# Patient Record
Sex: Male | Born: 1950 | Race: White | Hispanic: No | Marital: Married | Smoking: Former smoker
Health system: Southern US, Community
[De-identification: ages and names within clinical notes are randomized; demographics above are authoritative.]

## PROBLEM LIST (undated history)

## (undated) DIAGNOSIS — F32A Depression, unspecified: Secondary | ICD-10-CM

## (undated) DIAGNOSIS — J301 Allergic rhinitis due to pollen: Secondary | ICD-10-CM

## (undated) DIAGNOSIS — T7840XA Allergy, unspecified, initial encounter: Secondary | ICD-10-CM

## (undated) DIAGNOSIS — J40 Bronchitis, not specified as acute or chronic: Secondary | ICD-10-CM

## (undated) DIAGNOSIS — Z807 Family history of other malignant neoplasms of lymphoid, hematopoietic and related tissues: Secondary | ICD-10-CM

## (undated) DIAGNOSIS — F329 Major depressive disorder, single episode, unspecified: Secondary | ICD-10-CM

## (undated) DIAGNOSIS — J439 Emphysema, unspecified: Secondary | ICD-10-CM

## (undated) DIAGNOSIS — J449 Chronic obstructive pulmonary disease, unspecified: Secondary | ICD-10-CM

## (undated) DIAGNOSIS — D509 Iron deficiency anemia, unspecified: Secondary | ICD-10-CM

## (undated) DIAGNOSIS — Z8371 Family history of colonic polyps: Secondary | ICD-10-CM

## (undated) DIAGNOSIS — Z860101 Personal history of adenomatous and serrated colon polyps: Secondary | ICD-10-CM

## (undated) DIAGNOSIS — E785 Hyperlipidemia, unspecified: Secondary | ICD-10-CM

## (undated) DIAGNOSIS — J189 Pneumonia, unspecified organism: Secondary | ICD-10-CM

## (undated) DIAGNOSIS — J45909 Unspecified asthma, uncomplicated: Secondary | ICD-10-CM

## (undated) DIAGNOSIS — Z86010 Personal history of colonic polyps: Secondary | ICD-10-CM

## (undated) DIAGNOSIS — K219 Gastro-esophageal reflux disease without esophagitis: Secondary | ICD-10-CM

## (undated) DIAGNOSIS — Z808 Family history of malignant neoplasm of other organs or systems: Secondary | ICD-10-CM

## (undated) DIAGNOSIS — Z801 Family history of malignant neoplasm of trachea, bronchus and lung: Secondary | ICD-10-CM

## (undated) HISTORY — DX: Family history of malignant neoplasm of other organs or systems: Z80.8

## (undated) HISTORY — DX: Personal history of adenomatous and serrated colon polyps: Z86.0101

## (undated) HISTORY — PX: NASAL POLYP SURGERY: SHX186

## (undated) HISTORY — DX: Family history of colonic polyps: Z83.71

## (undated) HISTORY — DX: Allergy, unspecified, initial encounter: T78.40XA

## (undated) HISTORY — DX: Hyperlipidemia, unspecified: E78.5

## (undated) HISTORY — DX: Gastro-esophageal reflux disease without esophagitis: K21.9

## (undated) HISTORY — PX: COLONOSCOPY: SHX174

## (undated) HISTORY — DX: Major depressive disorder, single episode, unspecified: F32.9

## (undated) HISTORY — DX: Emphysema, unspecified: J43.9

## (undated) HISTORY — DX: Family history of other malignant neoplasms of lymphoid, hematopoietic and related tissues: Z80.7

## (undated) HISTORY — DX: Iron deficiency anemia, unspecified: D50.9

## (undated) HISTORY — DX: Chronic obstructive pulmonary disease, unspecified: J44.9

## (undated) HISTORY — DX: Allergic rhinitis due to pollen: J30.1

## (undated) HISTORY — PX: POLYPECTOMY: SHX149

## (undated) HISTORY — DX: Personal history of colonic polyps: Z86.010

## (undated) HISTORY — DX: Family history of malignant neoplasm of trachea, bronchus and lung: Z80.1

## (undated) HISTORY — PX: UPPER GASTROINTESTINAL ENDOSCOPY: SHX188

## (undated) HISTORY — DX: Depression, unspecified: F32.A

---

## 1998-07-02 HISTORY — PX: CARDIAC CATHETERIZATION: SHX172

## 1998-08-31 ENCOUNTER — Ambulatory Visit (HOSPITAL_COMMUNITY): Admission: RE | Admit: 1998-08-31 | Discharge: 1998-08-31 | Payer: Self-pay | Admitting: *Deleted

## 1999-05-11 ENCOUNTER — Ambulatory Visit (HOSPITAL_COMMUNITY): Admission: RE | Admit: 1999-05-11 | Discharge: 1999-05-11 | Payer: Self-pay | Admitting: Internal Medicine

## 1999-05-11 ENCOUNTER — Encounter: Payer: Self-pay | Admitting: Internal Medicine

## 1999-10-18 ENCOUNTER — Ambulatory Visit (HOSPITAL_BASED_OUTPATIENT_CLINIC_OR_DEPARTMENT_OTHER): Admission: RE | Admit: 1999-10-18 | Discharge: 1999-10-18 | Payer: Self-pay | Admitting: *Deleted

## 2004-01-14 ENCOUNTER — Other Ambulatory Visit: Admission: RE | Admit: 2004-01-14 | Discharge: 2004-01-14 | Payer: Self-pay | Admitting: Cardiology

## 2005-02-18 ENCOUNTER — Emergency Department: Payer: Self-pay | Admitting: Emergency Medicine

## 2005-02-24 ENCOUNTER — Emergency Department: Payer: Self-pay | Admitting: Emergency Medicine

## 2009-05-05 ENCOUNTER — Emergency Department: Payer: Self-pay | Admitting: Emergency Medicine

## 2009-05-24 ENCOUNTER — Ambulatory Visit: Payer: Self-pay | Admitting: Family Medicine

## 2009-05-24 DIAGNOSIS — J45909 Unspecified asthma, uncomplicated: Secondary | ICD-10-CM | POA: Insufficient documentation

## 2009-05-24 DIAGNOSIS — J439 Emphysema, unspecified: Secondary | ICD-10-CM | POA: Insufficient documentation

## 2009-05-24 DIAGNOSIS — F329 Major depressive disorder, single episode, unspecified: Secondary | ICD-10-CM | POA: Insufficient documentation

## 2009-05-24 DIAGNOSIS — K219 Gastro-esophageal reflux disease without esophagitis: Secondary | ICD-10-CM | POA: Insufficient documentation

## 2009-05-24 DIAGNOSIS — F3289 Other specified depressive episodes: Secondary | ICD-10-CM | POA: Insufficient documentation

## 2009-06-09 ENCOUNTER — Ambulatory Visit: Payer: Self-pay | Admitting: Internal Medicine

## 2009-06-14 ENCOUNTER — Encounter: Payer: Self-pay | Admitting: Family Medicine

## 2010-05-26 ENCOUNTER — Encounter: Payer: Self-pay | Admitting: Family Medicine

## 2010-06-01 ENCOUNTER — Ambulatory Visit: Payer: Self-pay | Admitting: Family Medicine

## 2010-07-05 ENCOUNTER — Ambulatory Visit
Admission: RE | Admit: 2010-07-05 | Discharge: 2010-07-05 | Payer: Self-pay | Source: Home / Self Care | Attending: Family Medicine | Admitting: Family Medicine

## 2010-08-03 NOTE — Assessment & Plan Note (Signed)
Summary: 6 WK F/U DLO   Vital Signs:  Patient profile:   60 year old male Height:      67.25 inches Weight:      191.50 pounds BMI:     29.88 Temp:     97.8 degrees F oral Pulse rate:   80 / minute Pulse rhythm:   regular BP sitting:   130 / 80  (left arm) Cuff size:   large  Vitals Entered By: Benny Lennert CMA Duncan Dull) (July 05, 2010 9:43 AM)  History of Present Illness: Chief complaint 3 week follow up   60 year old male:  Got Dulera and Spiriva -   Never having to use the rescue at all  Feels much better, tremendous difference, felt so within the first week Now able to work outside, do farm work, in the cold, no SOB, no dyspnea on exertion.  Allergies: 1)  ! * Pinicillin  Past History:  Past medical, surgical, family and social histories (including risk factors) reviewed, and no changes noted (except as noted below).  Past Medical History: Reviewed history from 06/14/2009 and no changes required. GERD COPD, Severe, dx on PFT, 06/2009 DEPRESSION ASTHMA, CHILDHOOD   Past Surgical History: Reviewed history from 06/14/2009 and no changes required. 2000 Heart catherazation, normal 1990-2000 ployps removed from nasal sinusus  PFT: 06/2009, severe COPD with % < 50  Family History: Reviewed history from 05/24/2009 and no changes required. Family History of Alcoholism/Addiction Family History of Arthritis Family History Breast cancer 1st degree relative <50 Family History High cholesterol Family History Hypertension  Social History: Reviewed history from 05/24/2009 and no changes required. Occupation: fumigation Spouse of [place] Alcohol use-no  Review of Systems Resp:  Denies cough, pleuritic, shortness of breath, sputum productive, and wheezing.  Physical Exam  General:  Well-developed,well-nourished,in no acute distress; alert,appropriate and cooperative throughout examination Head:  Normocephalic and atraumatic without obvious  abnormalities. No apparent alopecia or balding. Ears:  no external deformities.   Nose:  no external deformity.   Lungs:  normal respiratory effort, no intercostal retractions, no accessory muscle use, no crackles, and no wheezes.  decreased bs diffusely Heart:  Normal rate and regular rhythm. S1 and S2 normal without gallop, murmur, click, rub or other extra sounds. Psych:  Cognition and judgment appear intact. Alert and cooperative with normal attention span and concentration. No apparent delusions, illusions, hallucinations   Impression & Recommendations:  Problem # 1:  COPD (ICD-496) Assessment Improved dramatically better, f/u 6 mo for cpx  His updated medication list for this problem includes:    Combivent 18-103 Mcg/act Aero (Ipratropium-albuterol) .Marland Kitchen... 2 puffs every 4 hours    Spiriva Handihaler 18 Mcg Caps (Tiotropium bromide monohydrate) .Marland Kitchen... Contents of one capsule inhaled daily    Dulera 100-5 Mcg/act Aero (Mometasone furo-formoterol fum) .Marland Kitchen... 2 puffs b.i.d.  Complete Medication List: 1)  Prilosec 20 Mg Cpdr (Omeprazole) .... Daily 2)  Mucus Relief 400 Mg Tabs (Guaifenesin) .... One tablet 2 times daily 3)  Combivent 18-103 Mcg/act Aero (Ipratropium-albuterol) .... 2 puffs every 4 hours 4)  Spiriva Handihaler 18 Mcg Caps (Tiotropium bromide monohydrate) .... Contents of one capsule inhaled daily 5)  Dulera 100-5 Mcg/act Aero (Mometasone furo-formoterol fum) .... 2 puffs b.i.d.  Patient Instructions: 1)  6 months 2)  IDEALLY FOR A COMPLETE PHYSICAL    Orders Added: 1)  Est. Patient Level III [16109]     Current Allergies (reviewed today): ! * PINICILLIN

## 2010-08-03 NOTE — Assessment & Plan Note (Signed)
Summary: F/U [facility]   Vital Signs:  Patient profile:   60 year old male Height:      67.25 inches Weight:      190.75 pounds BMI:     29.76 Temp:     97.9 degrees F oral Pulse rate:   80 / minute Pulse rhythm:   regular BP sitting:   130 / 80  (left arm) Cuff size:   regular  Vitals Entered By: Benny Lennert CMA Duncan Dull) (June 01, 2010 8:45 AM)    History of Present Illness: Chief complaint Follow up [facility] (copd)  60 year old gentleman with significant chronic obstructive pulmonary disease with severe chronic obstructive pulmonary disease based on gold criteria prior PFTs. He has been lost to followup, never followed up, and has not been taking any inhalers. He did go to the urgent care a week or 2 ago, and was given a course of azithromycin, and has been using Combivent intermittently.  He received some Combivent from a friend, has been using up to about 7 times a day.  He does have significant shortness of breath and tightness particularly goes out in the cold air. 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  He also smoked for greater than 40 years, but quit about 5 years ago.  Review of systems: The fever or chills currently. He continued to have some occasional cough significant shortness of breath is reported as interfering with his ability to work and function, particularly in the cold air.  GEN: WDWN, NAD, Non-toxic, A & O x 3 HEENT: Atraumatic, Normocephalic. Neck supple. No masses, No LAD. Ears and Nose: No external deformity. CV: RRR, No M/G/R. No JVD. No thrill. No extra heart sounds. PULM: ddecreased breath sounds throughout without any significant wheezing, but poor air movement. No focal crackles. EXTR: No c/c/e NEURO: Normal gait.  PSYCH: Normally interactive. Conversant. Not depressed or anxious appearing.  Calm demeanor.    Allergies: 1)  ! *  Pinicillin   Impression & Recommendations:  Problem # 1:  COPD (ICD-496) Assessment Deteriorated followup in one month for complete physical examination.  He is poorly controlled. Limited financially somewhat. I gave him 6 months worth of samples. He is also going to pay for Spiriva out of pocket.  His updated medication list for this problem includes:    Combivent 18-103 Mcg/act Aero (Ipratropium-albuterol) .Marland Kitchen... 2 puffs every 4 hours    Spiriva Handihaler 18 Mcg Caps (Tiotropium bromide monohydrate) .Marland Kitchen... Contents of one capsule inhaled daily    Dulera 100-5 Mcg/act Aero (Mometasone furo-formoterol fum) .Marland Kitchen... 2 puffs b.i.d.  Complete Medication List: 1)  Prilosec 20 Mg Cpdr (Omeprazole) .... Daily 2)  Mucus Relief 400 Mg Tabs (Guaifenesin) .... One tablet 2 times daily 3)  Combivent 18-103 Mcg/act Aero (Ipratropium-albuterol) .... 2 puffs every 4 hours 4)  Spiriva Handihaler 18 Mcg Caps (Tiotropium bromide monohydrate) .... Contents of one capsule inhaled daily 5)  Prednisone 20 Mg Tabs (Prednisone) .... 2 by mouth daily 6)  Dulera 100-5 Mcg/act Aero (Mometasone furo-formoterol fum) .... 2 puffs b.i.d.  Patient Instructions: 1)  f/u 4-6 weeks Prescriptions: PREDNISONE 20 MG TABS (PREDNISONE) 2 by mouth daily  #14 x 0   Entered and Authorized by:   Hannah Beat MD   Signed by:   Hannah Beat MD on 06/01/2010   Method used:   Print then Give to Patient   RxID:  [id number] SPIRIVA HANDIHALER 18 MCG  CAPS (TIOTROPIUM BROMIDE MONOHYDRATE) contents of one capsule inhaled daily  #1 x 11   Entered and Authorized by:   Hannah Beat MD   Signed by:   Hannah Beat MD on 06/01/2010   Method used:   Print then Give to Patient   RxID:   [id number]    Orders Added: 1)  Est. Patient Level III [32355]    Current Allergies (reviewed today): ! * PINICILLIN

## 2010-08-03 NOTE — Letter (Signed)
Summary: [facility]   Imported By: Sherian Rein 06/06/2010 12:08:48  _____________________________________________________________________  External Attachment:    Type:   Image     Comment:   External Document

## 2010-11-17 NOTE — Op Note (Signed)
[place]. [facility]  Patient:    Richard Grimes, Richard Grimes                        MRN: [id number] Proc. Date: 10/18/99 Adm. Date:  [id number] Attending:  Aundria Mems                           Operative Report  PREOPERATIVE DIAGNOSIS:  Recurrent obstructing nasal sinus polyposis bilaterally.  POSTOPERATIVE DIAGNOSIS:  Recurrent obstructing nasal sinus polyposis bilaterally.  OPERATION PERFORMED:  Bilateral endoscopic removal of nasal sinus polyps by suction debridement.  SURGEON:  Kathy Breach, M.D.  ANESTHESIA:  General orotracheal.  DESCRIPTION OF PROCEDURE:  With the patient under general orotracheal anesthesia, the face was prepped and draped in sterile fashion. Vasoconstriction by nasal block with 4% Xylocaine ____________ solution on olive tip probes applied to the sphenopalatine anterior ethmoid nerve areas bilaterally.  Cotton pledgets soaked in similar solution were inserted packing the nose as much as possible with the presence of almost completely occluding polyps right greater than left.  Infiltration 1% Xylocaine 1:100,000 epinephrine into the anterior aspect of the middle meatal mucosa completed for further vasoconstriction.  On examination following decongesting patients nose, he had large mucoid polyps multiple almost completely occluding the right nasal chamber, middle meatal mainly and completely filling the posterir choanae.  On the left side, similar findings except not completely obstructive.  Under visualization with the nasal endoscope and using the flexion debrider, beginning on the right side, all the soft tissue polypoid mass readily encountered under direct visualization with the endoscope was removed until the previously formed wide sphenoid opening window was visualized and polypoid tissue removed as readily obtainable with the suction debrider from the sphenoid sinus on the right side.  There was no identifiable remnant of the  middle meatus on the right side from previous surgery. Polypoid tissue going medially and superiorly was removed but particular care was taken to not go high medially as there appeared to be a low riding lamina papyracea and possible very thin bony defect in the posterior lamina papyracea area.  The opening into the maxillary sinus was present and polypoid degenerative mucosa visualized but an aerated central portion to visualization through the ____________ window pre-existing from his previous surgery and polyp removal.  Essentially similar procedure done on the left side.  There was substantial remaining anterior aspect of the middle turbinate as landmarked for protection of approaching the lamina or the cribriform plate area which was avoided.  Again, there was a large opening into the sphenoid sinus that polypoid debris from the sphenoid sinus could be evacuated with suction debrider.  A large opening into the left antrum again visibly aerated in the central portion with cobblestone mucosal degenerative lining.  At completion of removal of all visible readily accessible polypoid disease in nose and ethmoid sinuses and sphenoid sinuses, the bleeding was very minimal so it was elected not to place any packing.  Blood loss estimated by suction cannisters about 100 cc for the case.  The patient tolerated the procedure well and was taken to the recovery room in stable general condition. DD:  10/18/99 TD:  10/18/99 Job: [id number]

## 2010-12-29 ENCOUNTER — Encounter: Payer: Self-pay | Admitting: Family Medicine

## 2011-01-01 ENCOUNTER — Encounter: Payer: BC Managed Care – PPO | Admitting: Family Medicine

## 2011-01-04 ENCOUNTER — Encounter: Payer: Self-pay | Admitting: Family Medicine

## 2011-01-04 ENCOUNTER — Ambulatory Visit (INDEPENDENT_AMBULATORY_CARE_PROVIDER_SITE_OTHER): Payer: BC Managed Care – PPO | Admitting: Family Medicine

## 2011-01-04 VITALS — BP 124/80 | HR 88 | Temp 98.1°F | Ht 68.5 in | Wt 174.1 lb

## 2011-01-04 DIAGNOSIS — J449 Chronic obstructive pulmonary disease, unspecified: Secondary | ICD-10-CM

## 2011-01-04 DIAGNOSIS — Z0000 Encounter for general adult medical examination without abnormal findings: Secondary | ICD-10-CM

## 2011-01-04 DIAGNOSIS — Z23 Encounter for immunization: Secondary | ICD-10-CM

## 2011-01-04 LAB — CBC WITH DIFFERENTIAL/PLATELET
Basophils Absolute: 0 10*3/uL (ref 0.0–0.1)
Basophils Relative: 0.8 % (ref 0.0–3.0)
Eosinophils Absolute: 0.1 10*3/uL (ref 0.0–0.7)
Eosinophils Relative: 2.6 % (ref 0.0–5.0)
HCT: 40.9 % (ref 39.0–52.0)
Hemoglobin: 13.9 g/dL (ref 13.0–17.0)
Lymphocytes Relative: 20.4 % (ref 12.0–46.0)
Lymphs Abs: 1 10*3/uL (ref 0.7–4.0)
MCHC: 33.8 g/dL (ref 30.0–36.0)
MCV: 84.6 fl (ref 78.0–100.0)
Monocytes Absolute: 0.6 10*3/uL (ref 0.1–1.0)
Monocytes Relative: 11.7 % (ref 3.0–12.0)
Neutro Abs: 3.1 10*3/uL (ref 1.4–7.7)
Neutrophils Relative %: 64.5 % (ref 43.0–77.0)
Platelets: 269 10*3/uL (ref 150.0–400.0)
RBC: 4.84 Mil/uL (ref 4.22–5.81)
RDW: 14.6 % (ref 11.5–14.6)
WBC: 4.9 10*3/uL (ref 4.5–10.5)

## 2011-01-04 LAB — HEPATIC FUNCTION PANEL
AST: 23 U/L (ref 0–37)
Albumin: 3.9 g/dL (ref 3.5–5.2)
Alkaline Phosphatase: 78 U/L (ref 39–117)
Bilirubin, Direct: 0.1 mg/dL (ref 0.0–0.3)
Total Bilirubin: 0.5 mg/dL (ref 0.3–1.2)

## 2011-01-04 LAB — BASIC METABOLIC PANEL
BUN: 12 mg/dL (ref 6–23)
CO2: 26 mEq/L (ref 19–32)
Calcium: 8.7 mg/dL (ref 8.4–10.5)
Chloride: 107 mEq/L (ref 96–112)
Creatinine, Ser: 0.9 mg/dL (ref 0.4–1.5)
GFR: 89.71 mL/min (ref >60.00)
Glucose, Bld: 80 mg/dL (ref 70–99)
Potassium: 4.3 mEq/L (ref 3.5–5.1)
Sodium: 140 mEq/L (ref 135–145)

## 2011-01-04 MED ORDER — MOMETASONE FURO-FORMOTEROL FUM 100-5 MCG/ACT IN AERO: 2.0000 | INHALATION_SPRAY | Freq: Two times a day (BID) | RESPIRATORY_TRACT | Status: DC

## 2011-01-04 MED ORDER — TADALAFIL 20 MG PO TABS: 20.0000 mg | ORAL_TABLET | Freq: Every day | ORAL | Status: AC | PRN

## 2011-01-04 NOTE — Progress Notes (Signed)
Richard Grimes, a 60 y.o. male presents today in the office for the following:  Health Maintenance  Viagra working OK - some HA  COPD: Dulera and Spiriva every day. Doing great. Feels much better  Pneumonia vaccine Stool card sample - refuses colonoscopy  Preventative Health Maintenance Visit:  Health Maintenance Summary Reviewed and updated, unless pt declines services.  Tobacco History Reviewed. Alcohol: No concerns, no excessive use Exercise Habits: Some activity, rec at least 30 mins 5 times a week STD concerns: no risk or activity to increase risk Drug Use: None Encouraged self-testicular check  Health Maintenance  Topic Date Due  . Colon Cancer Screening Annual Fobt  09/12/2002  . Colonoscopy  09/12/2002  . Influenza Vaccine  04/02/2011  . Tetanus/tdap  02/19/2015  . Pneumococcal Polysaccharide Vaccine (#2) 01/04/2016    Labs reviewed with the patient.  General: Denies fever, chills, sweats. No significant weight loss. Eyes: Denies blurring,significant itching ENT: Denies earache, sore throat, and hoarseness. Cardiovascular: Denies chest pains, palpitations, dyspnea on exertion Respiratory: Denies cough, dyspnea at rest,wheeezing Breast: no concerns about lumps GI: Denies nausea, vomiting, diarrhea, constipation, change in bowel habits, abdominal pain, melena, hematochezia GU: Denies penile discharge, ED - discussed above, no urinary flow / outflow problems. No STD concerns. Musculoskeletal: Denies back pain, joint pain Derm: Denies rash, itching Neuro: Denies  paresthesias, frequent falls, frequent headaches Psych: Denies depression, anxiety Endocrine: Denies cold intolerance, heat intolerance, polydipsia Heme: Denies enlarged lymph nodes Allergy: No hayfever   Physical Exam  Blood pressure 124/80, pulse 88, temperature 98.1 F (36.7 C), temperature source Oral, height 5' 8.5" (1.74 m), weight 174 lb 1.9 oz (78.98 kg), SpO2 97.00%.  PE: GEN: well developed, well  nourished, no acute distress Eyes: conjunctiva and lids normal, PERRLA, EOMI ENT: TM clear, nares clear, oral exam WNL Neck: supple, no lymphadenopathy, no thyromegaly, no JVD Pulm: clear to auscultation and percussion, respiratory effort normal CV: regular rate and rhythm, S1-S2, no murmur, rub or gallop, no bruits, peripheral pulses normal and symmetric, no cyanosis, clubbing, edema or varicosities Chest: no scars, masses, no gynecomastia   GI: soft, non-tender; no hepatosplenomegaly, masses; active bowel sounds all quadrants GU: no hernia, testicular mass, penile discharge, priapism or prostate enlargement Lymph: no cervical, axillary or inguinal adenopathy MSK: gait normal, muscle tone and strength WNL, no joint swelling, effusions, discoloration, crepitus  SKIN: clear, good turgor, color WNL, no rashes, lesions, or ulcerations Neuro: normal mental status, normal strength, sensation, and motion Psych: alert; oriented to person, place and time, normally interactive and not anxious or depressed in appearance.  Assessment and Plan: 1.  HM: The patient's preventative maintenance and recommended screening tests for an annual wellness exam were reviewed in full today. Brought up to date unless services declined.  Counselled on the importance of diet, exercise, and its role in overall health and mortality. The patient's FH and SH was reviewed, including their home life, tobacco status, and drug and alcohol status.  Trial cialis

## 2011-01-04 NOTE — Patient Instructions (Signed)
F/u 1 year

## 2011-01-05 ENCOUNTER — Encounter: Payer: Self-pay | Admitting: Family Medicine

## 2011-01-16 ENCOUNTER — Encounter: Payer: Self-pay | Admitting: *Deleted

## 2011-01-16 ENCOUNTER — Other Ambulatory Visit: Payer: BC Managed Care – PPO

## 2011-01-16 ENCOUNTER — Other Ambulatory Visit: Payer: Self-pay | Admitting: Family Medicine

## 2011-01-16 DIAGNOSIS — Z1211 Encounter for screening for malignant neoplasm of colon: Secondary | ICD-10-CM

## 2011-01-16 LAB — FECAL OCCULT BLOOD, IMMUNOCHEMICAL: Fecal Occult Bld: NEGATIVE

## 2011-03-20 ENCOUNTER — Telehealth: Payer: Self-pay | Admitting: *Deleted

## 2011-03-20 DIAGNOSIS — Z3009 Encounter for other general counseling and advice on contraception: Secondary | ICD-10-CM

## 2011-03-20 NOTE — Telephone Encounter (Signed)
done

## 2011-03-20 NOTE — Telephone Encounter (Signed)
Pt would like referral to urologist for vasectomy.  He doesn't have a preference where, would like your opinion on the best doctor to go to .

## 2011-05-30 ENCOUNTER — Ambulatory Visit (INDEPENDENT_AMBULATORY_CARE_PROVIDER_SITE_OTHER): Payer: BC Managed Care – PPO | Admitting: Family Medicine

## 2011-05-30 ENCOUNTER — Encounter: Payer: Self-pay | Admitting: Family Medicine

## 2011-05-30 VITALS — BP 130/80 | HR 100 | Temp 98.1°F | Ht 66.5 in | Wt 164.8 lb

## 2011-05-30 DIAGNOSIS — F32A Depression, unspecified: Secondary | ICD-10-CM

## 2011-05-30 DIAGNOSIS — F329 Major depressive disorder, single episode, unspecified: Secondary | ICD-10-CM

## 2011-05-30 DIAGNOSIS — R5383 Other fatigue: Secondary | ICD-10-CM

## 2011-05-30 DIAGNOSIS — R5381 Other malaise: Secondary | ICD-10-CM

## 2011-05-30 MED ORDER — TIOTROPIUM BROMIDE MONOHYDRATE 18 MCG IN CAPS: 18.0000 ug | ORAL_CAPSULE | Freq: Every day | RESPIRATORY_TRACT | Status: DC

## 2011-05-30 MED ORDER — FLUOXETINE HCL 20 MG PO TABS: 20.0000 mg | ORAL_TABLET | Freq: Every day | ORAL | Status: DC

## 2011-05-30 MED ORDER — MOMETASONE FURO-FORMOTEROL FUM 200-5 MCG/ACT IN AERO: 2.0000 | INHALATION_SPRAY | Freq: Two times a day (BID) | RESPIRATORY_TRACT | Status: DC

## 2011-05-30 NOTE — Progress Notes (Signed)
  Patient Name: Richard Grimes Date of Birth: [birth year] Age: 60 y.o. Medical Record Number: [id number] Gender: male  History of Present Illness:  Richard Grimes is a 60 y.o. very pleasant male patient who presents with the following:  Talked to his daughter on Monday. Worth checking his thyroid.  Started dating a lady. May, June, and July. August he was gone a lot. Had some problems with separation. Showing with some irritability. Was feeling jealous. Then has had some other ones. Girlfriend maybe a little less affectionate. When with her, he feels OK. When knows he is going to leave, feels like hardly going to get by. Only feels ok with holding her. Tellls her that she loves her.   Having some crying spells. Fall is coming on. Arguing some with family. Sleeps four or 5 hours. No real interest in doing things and hanging out.   2004 - took some antidepressants.    Past Medical History, Surgical History, Social History, Family History, and Problem List have been reviewed in EHR and updated if relevant.  Review of Systems: Minimal shortness of breath. No chest pain. Notable for psychiatric complaints above, but otherwise he is doing well. No problems with erections. Fatigue.  Physical Examination: Filed Vitals:   05/30/11 1140  BP: 130/80  Pulse: 100  Temp: 98.1 F (36.7 C)  TempSrc: Oral  Height: 5' 6.5" (1.689 m)  Weight: 164 lb 12.8 oz (74.753 kg)  SpO2: 96%     GEN: WDWN, NAD, Non-toxic, Alert & Oriented x 3 HEENT: Atraumatic, Normocephalic.  Ears and Nose: No external deformity. EXTR: No clubbing/cyanosis/edema NEURO: Normal gait.  PSYCH: Tearful, emotionally labile.  Assessment and Plan: 1. Fatigue  Testosterone, free, total, TSH  2. Depression  Testosterone, free, total, TSH    >25 minutes spent in face to face time with patient, >50% spent in counselling or coordination of care: I also refilled the patient's COPD medication. Virtually all of the visit was spent in  discussion about his depression. The patient is sleeping poorly. He is crying, he is having some lability of his mood. He is irritable with some members of his family. He seems quite different compared to on prior evaluations. No significant anxiety or anxiety attacks. Denies suicidality or homicidality. He does have some psychomotor depression as well as decreased energy and interest in doing things. I discussed with him potential causes for depression, brain pharmacological consequences. Additionally, he wanted to have his thyroid and testosterone levels checked, which I think is reasonable. Regardless, think that he is clinically depressed. We have started him on 20 mg of Prozac and will followup with him in one month.

## 2011-05-31 LAB — TESTOSTERONE, FREE, TOTAL, SHBG
Sex Hormone Binding: 49 nmol/L (ref 13–71)
Testosterone, Free: 60.9 pg/mL (ref 47.0–244.0)
Testosterone-% Free: 1.6 % (ref 1.6–2.9)
Testosterone: 389.62 ng/dL (ref 250–890)

## 2011-07-04 ENCOUNTER — Ambulatory Visit: Payer: BC Managed Care – PPO | Admitting: Family Medicine

## 2011-07-16 ENCOUNTER — Ambulatory Visit: Payer: BC Managed Care – PPO | Admitting: Family Medicine

## 2011-07-18 ENCOUNTER — Encounter: Payer: Self-pay | Admitting: Family Medicine

## 2011-07-18 ENCOUNTER — Ambulatory Visit (INDEPENDENT_AMBULATORY_CARE_PROVIDER_SITE_OTHER): Payer: BC Managed Care – PPO | Admitting: Family Medicine

## 2011-07-18 DIAGNOSIS — J301 Allergic rhinitis due to pollen: Secondary | ICD-10-CM | POA: Insufficient documentation

## 2011-07-18 DIAGNOSIS — F329 Major depressive disorder, single episode, unspecified: Secondary | ICD-10-CM

## 2011-07-18 HISTORY — DX: Allergic rhinitis due to pollen: J30.1

## 2011-07-18 MED ORDER — FLUOXETINE HCL 20 MG PO TABS: 20.0000 mg | ORAL_TABLET | Freq: Every day | ORAL | Status: DC

## 2011-07-18 MED ORDER — FLUTICASONE PROPIONATE 50 MCG/ACT NA SUSP: 2.0000 | Freq: Every day | NASAL | Status: DC

## 2011-07-18 NOTE — Progress Notes (Signed)
  Patient Name: Richard Grimes Date of Birth: 1951/04/25 Age: 61 y.o. Medical Record Number: [id number] Gender: male Date of Encounter: 07/18/2011  History of Present Illness:  Richard Grimes is a 62 y.o. very pleasant male patient who presents with the following:  Depression is better. Dramatically better. Tolerating the medication fine. He does think he might be sweating a little bit more. He is not suicidal or having in the homicidality. Is interested in activity is much better. Getting along well at home with his fiance.  Some sinus congestion: postnasal drip  Past Medical History, Surgical History, Social History, Family History, Problem List, Medications, and Allergies have been reviewed and updated if relevant.  Review of Systems:  GEN: No acute illnesses, no fevers, chills. Post nasal only GI: No n/v/d, eating normally Pulm: No SOB Interactive and getting along well at home.  Otherwise, ROS is as per the HPI.   Physical Examination: Filed Vitals:   07/18/11 0959  BP: 110/80  Pulse: 91  Temp: 97.8 F (36.6 C)  TempSrc: Oral  Height: 5' 6.5" (1.689 m)  Weight: 167 lb 1.9 oz (75.805 kg)  SpO2: 97%    Body mass index is 26.57 kg/(m^2).   GEN: WDWN, NAD, Non-toxic, A & O x 3 HEENT: Atraumatic, Normocephalic. Neck supple. No masses, No LAD. Ears and Nose: No external deformity. CV: RRR, No M/G/R. No JVD. No thrill. No extra heart sounds. PULM: CTA B, no wheezes, crackles, rhonchi. No retractions. No resp. distress. No accessory muscle use. EXTR: No c/c/e NEURO Normal gait.  PSYCH: Normally interactive. Conversant. Not depressed or anxious appearing.  Calm demeanor.    Assessment and Plan: 1. DEPRESSION   2. Allergic rhinitis due to pollen     flonsae  Much better, recheck at cpx

## 2011-08-13 ENCOUNTER — Telehealth: Payer: Self-pay | Admitting: *Deleted
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
## 2011-08-13 NOTE — Telephone Encounter (Signed)
Patient called to let you know that it is not him. Patient says this is just find.

## 2011-08-13 NOTE — Telephone Encounter (Signed)
ok 

## 2011-08-13 NOTE — Telephone Encounter (Signed)
I called him and gave him the name of Richard Grimes. I also asked him to clarify if he is doing OK, is this question regarding his own history, family or what.  LMOM - asked to call heather back

## 2012-01-01 ENCOUNTER — Other Ambulatory Visit (INDEPENDENT_AMBULATORY_CARE_PROVIDER_SITE_OTHER): Payer: BC Managed Care – PPO

## 2012-01-01 DIAGNOSIS — R5381 Other malaise: Secondary | ICD-10-CM

## 2012-01-01 DIAGNOSIS — Z125 Encounter for screening for malignant neoplasm of prostate: Secondary | ICD-10-CM

## 2012-01-01 DIAGNOSIS — Z13220 Encounter for screening for lipoid disorders: Secondary | ICD-10-CM

## 2012-01-01 DIAGNOSIS — R5383 Other fatigue: Secondary | ICD-10-CM

## 2012-01-01 LAB — BASIC METABOLIC PANEL
Calcium: 9.1 mg/dL (ref 8.4–10.5)
Creatinine, Ser: 1 mg/dL (ref 0.4–1.5)
GFR: 85.12 mL/min (ref >60.00)
Glucose, Bld: 99 mg/dL (ref 70–99)
Sodium: 139 mEq/L (ref 135–145)

## 2012-01-01 LAB — CBC WITH DIFFERENTIAL/PLATELET
Eosinophils Relative: 3.3 % (ref 0.0–5.0)
HCT: 43.4 % (ref 39.0–52.0)
Hemoglobin: 14.4 g/dL (ref 13.0–17.0)
Lymphs Abs: 1.2 10*3/uL (ref 0.7–4.0)
MCV: 85.1 fl (ref 78.0–100.0)
Monocytes Absolute: 0.5 10*3/uL (ref 0.1–1.0)
Neutro Abs: 3.4 10*3/uL (ref 1.4–7.7)
Platelets: 214 10*3/uL (ref 150.0–400.0)
RDW: 13.9 % (ref 11.5–14.6)
WBC: 5.2 10*3/uL (ref 4.5–10.5)

## 2012-01-01 LAB — PSA: PSA: 0.53 ng/mL (ref 0.10–4.00)

## 2012-01-01 LAB — LIPID PANEL
HDL: 47.8 mg/dL (ref >39.00)
Triglycerides: 90 mg/dL (ref 0.0–149.0)

## 2012-01-01 LAB — HEPATIC FUNCTION PANEL
Albumin: 3.8 g/dL (ref 3.5–5.2)
Alkaline Phosphatase: 92 U/L (ref 39–117)

## 2012-01-01 LAB — TSH: TSH: 1.7 u[IU]/mL (ref 0.35–5.50)

## 2012-01-07 ENCOUNTER — Encounter: Payer: Self-pay | Admitting: Family Medicine

## 2012-01-07 ENCOUNTER — Ambulatory Visit (INDEPENDENT_AMBULATORY_CARE_PROVIDER_SITE_OTHER): Payer: BC Managed Care – PPO | Admitting: Family Medicine

## 2012-01-07 VITALS — BP 120/72 | HR 72 | Temp 97.7°F | Ht 65.5 in | Wt 189.2 lb

## 2012-01-07 DIAGNOSIS — Z0000 Encounter for general adult medical examination without abnormal findings: Secondary | ICD-10-CM

## 2012-01-07 DIAGNOSIS — Z1211 Encounter for screening for malignant neoplasm of colon: Secondary | ICD-10-CM

## 2012-01-07 NOTE — Progress Notes (Signed)
Nature conservation officer at [facility] [place] [address] [place] [address] Phone: [phone] Fax: [phone]   Patient Name: Richard Grimes Date of Birth: [birth year] Medical Record Number: [id number] Gender: male Date of Encounter: 01/07/2012  Chief Complaint: Annual Exam   History of Present Illness:  Richard Grimes is a 61 y.o. very pleasant male patient who presents with the following:  Preventative Health Maintenance Visit:  Health Maintenance Summary Reviewed and updated, unless pt declines services.  Tobacco History Reviewed. Quit since 2006.  Alcohol: No concerns, no excessive use Exercise Habits: Some activity, rec at least 30 mins 5 times a week STD concerns: no risk or activity to increase risk Drug Use: None Encouraged self-testicular check  Health Maintenance  Topic Date Due  . Colon Cancer Screening Annual Fobt  09/12/2002  . Colonoscopy  09/12/2002  . Influenza Vaccine  04/01/2012  . Tetanus/tdap  02/19/2015  . Pneumococcal Polysaccharide Vaccine (#2) 01/04/2016  declines colonoscopy, will do IFOB  Labs reviewed with the patient.   Lipids:    Component Value Date/Time   CHOL 142 01/01/2012 1031   TRIG 90.0 01/01/2012 1031   HDL 47.80 01/01/2012 1031   LDLDIRECT 85.4 01/04/2011 1054   VLDL 18.0 01/01/2012 1031   CHOLHDL 3 01/01/2012 1031    CBC:    Component Value Date/Time   WBC 5.2 01/01/2012 1031   HGB 14.4 01/01/2012 1031   HCT 43.4 01/01/2012 1031   PLT 214.0 01/01/2012 1031   MCV 85.1 01/01/2012 1031   NEUTROABS 3.4 01/01/2012 1031   LYMPHSABS 1.2 01/01/2012 1031   MONOABS 0.5 01/01/2012 1031   EOSABS 0.2 01/01/2012 1031   BASOSABS 0.0 01/01/2012 1031    Basic Metabolic Panel:    Component Value Date/Time   NA 139 01/01/2012 1031   K 4.1 01/01/2012 1031   CL 105 01/01/2012 1031   CO2 25 01/01/2012 1031   BUN 12 01/01/2012 1031   CREATININE 1.0 01/01/2012 1031   GLUCOSE 99 01/01/2012 1031   CALCIUM 9.1 01/01/2012 1031    Lab Results  Component Value Date   ALT 19  01/01/2012   AST 21 01/01/2012   ALKPHOS 92 01/01/2012   BILITOT 0.5 01/01/2012    Lab Results  Component Value Date   PSA 0.53 01/01/2012   PSA 0.67 01/04/2011     Patient Active Problem List  Diagnosis  . DEPRESSION  . ASTHMA, CHILDHOOD  . COPD  . GERD  . Allergic rhinitis due to pollen   Past Medical History  Diagnosis Date  . GERD (gastroesophageal reflux disease)   . COPD (chronic obstructive pulmonary disease)     Severe, based on Spirometry.   . Depression   . Allergic rhinitis due to pollen 07/18/2011   Past Surgical History  Procedure Date  . Nasal polyp surgery   . Cardiac catheterization 2000    Normal coronaries   History  Substance Use Topics  . Smoking status: Former Smoker    Quit date: 07/02/2004  . Smokeless tobacco: Not on file  . Alcohol Use: No   No family history on file. No Known Allergies  Medication list has been reviewed and updated.  Current Outpatient Prescriptions on File Prior to Visit  Medication Sig Dispense Refill  . fluticasone (FLONASE) 50 MCG/ACT nasal spray Place 2 sprays into the nose daily.  48 g  prn  . Mometasone Furo-Formoterol Fum (DULERA) 200-5 MCG/ACT AERO Inhale 2 puffs into the lungs 2 (two) times daily.  1 Inhaler  11  . omeprazole (PRILOSEC) 20 MG capsule Take 20 mg by mouth daily.        Marland Kitchen tiotropium (SPIRIVA HANDIHALER) 18 MCG inhalation capsule Place 1 capsule (18 mcg total) into inhaler and inhale daily.  30 capsule  11  . FLUoxetine (PROZAC) 20 MG tablet Take 1 tablet (20 mg total) by mouth daily.  90 tablet  3    Review of Systems:   General: Denies fever, chills, sweats. No significant weight loss. Eyes: Denies blurring,significant itching ENT: Denies earache, sore throat, and hoarseness. Cardiovascular: Denies chest pains, palpitations, dyspnea on exertion Respiratory: Denies cough, dyspnea at rest,wheeezing Breast: no concerns about lumps GI: Denies nausea, vomiting, diarrhea, constipation, change in bowel  habits, abdominal pain, melena, hematochezia GU: Denies penile discharge, ED, urinary flow / outflow problems. No STD concerns. Musculoskeletal: Denies back pain, joint pain Derm: Denies rash, itching Neuro: Denies  paresthesias, frequent falls, frequent headaches Psych: Denies depression, anxiety Endocrine: Denies cold intolerance, heat intolerance, polydipsia Heme: Denies enlarged lymph nodes Allergy: No hayfever   Physical Examination: Filed Vitals:   01/07/12 0839  BP: 120/72  Pulse: 72  Temp: 97.7 F (36.5 C)   Filed Vitals:   01/07/12 0839  Height: 5' 5.5" (1.664 m)  Weight: 189 lb 4 oz (85.843 kg)   Body mass index is 31.01 kg/(m^2). Ideal Body Weight: Weight in (lb) to have BMI = 25: 152.2    Wt Readings from Last 3 Encounters:  01/07/12 189 lb 4 oz (85.843 kg)  07/18/11 167 lb 1.9 oz (75.805 kg)  05/30/11 164 lb 12.8 oz (74.753 kg)    GEN: well developed, well nourished, no acute distress Eyes: conjunctiva and lids normal, PERRLA, EOMI ENT: TM clear, nares clear, oral exam WNL Neck: supple, no lymphadenopathy, no thyromegaly, no JVD Pulm: clear to auscultation and percussion, respiratory effort normal CV: regular rate and rhythm, S1-S2, no murmur, rub or gallop, no bruits, peripheral pulses normal and symmetric, no cyanosis, clubbing, edema or varicosities Chest: no scars, masses GI: soft, non-tender; no hepatosplenomegaly, masses; active bowel sounds all quadrants GU: no hernia, testicular mass, penile discharge, or prostate enlargement Lymph: no cervical, axillary or inguinal adenopathy MSK: gait normal, muscle tone and strength WNL, no joint swelling, effusions, discoloration, crepitus  SKIN: clear, good turgor, color WNL, no rashes, lesions, or ulcerations Neuro: normal mental status, normal strength, sensation, and motion Psych: alert; oriented to person, place and time, normally interactive and not anxious or depressed in appearance.   EKG / Labs /  Xrays: None available at time of encounter  Assessment and Plan:  1. Routine general medical examination at a health care facility    2. Special screening for malignant neoplasm of colon  Fecal occult blood, imunochemical   The patient's preventative maintenance and recommended screening tests for an annual wellness exam were reviewed in full today. Brought up to date unless services declined.  Counselled on the importance of diet, exercise, and its role in overall health and mortality. The patient's FH and SH was reviewed, including their home life, tobacco status, and drug and alcohol status.   Doing well, breathing doing well.  Orders Today: Orders Placed This Encounter  Procedures  . Fecal occult blood, imunochemical    Standing Status: Future     Number of Occurrences:      Standing Expiration Date: 01/06/2013    Medications Today: Meds ordered this encounter  Medications  . FLUoxetine (PROZAC) 20 MG capsule  Sig: Take 1 capsule by mouth daily.     Hannah Beat, MD

## 2012-06-21 ENCOUNTER — Other Ambulatory Visit: Payer: Self-pay | Admitting: Family Medicine

## 2012-07-20 ENCOUNTER — Other Ambulatory Visit: Payer: Self-pay | Admitting: Family Medicine

## 2012-09-18 ENCOUNTER — Other Ambulatory Visit: Payer: Self-pay | Admitting: Family Medicine

## 2012-11-19 ENCOUNTER — Other Ambulatory Visit: Payer: Self-pay | Admitting: Family Medicine

## 2012-12-17 ENCOUNTER — Other Ambulatory Visit: Payer: Self-pay | Admitting: Family Medicine

## 2012-12-17 DIAGNOSIS — Z13220 Encounter for screening for lipoid disorders: Secondary | ICD-10-CM

## 2012-12-17 DIAGNOSIS — Z79899 Other long term (current) drug therapy: Secondary | ICD-10-CM

## 2012-12-17 DIAGNOSIS — Z125 Encounter for screening for malignant neoplasm of prostate: Secondary | ICD-10-CM

## 2012-12-22 ENCOUNTER — Other Ambulatory Visit: Payer: Self-pay | Admitting: Family Medicine

## 2012-12-31 ENCOUNTER — Other Ambulatory Visit (INDEPENDENT_AMBULATORY_CARE_PROVIDER_SITE_OTHER): Payer: BC Managed Care – PPO

## 2012-12-31 DIAGNOSIS — Z13220 Encounter for screening for lipoid disorders: Secondary | ICD-10-CM

## 2012-12-31 DIAGNOSIS — Z125 Encounter for screening for malignant neoplasm of prostate: Secondary | ICD-10-CM

## 2012-12-31 DIAGNOSIS — Z79899 Other long term (current) drug therapy: Secondary | ICD-10-CM

## 2012-12-31 LAB — CBC WITH DIFFERENTIAL/PLATELET
Basophils Relative: 1 % (ref 0.0–3.0)
Eosinophils Absolute: 0.3 10*3/uL (ref 0.0–0.7)
HCT: 46.4 % (ref 39.0–52.0)
Hemoglobin: 15.5 g/dL (ref 13.0–17.0)
Lymphs Abs: 1.3 10*3/uL (ref 0.7–4.0)
MCHC: 33.5 g/dL (ref 30.0–36.0)
MCV: 83.7 fl (ref 78.0–100.0)
Monocytes Absolute: 0.6 10*3/uL (ref 0.1–1.0)
Neutro Abs: 2.9 10*3/uL (ref 1.4–7.7)
RBC: 5.54 Mil/uL (ref 4.22–5.81)

## 2012-12-31 LAB — LIPID PANEL
LDL Cholesterol: 107 mg/dL — ABNORMAL HIGH (ref 0–99)
VLDL: 13.2 mg/dL (ref 0.0–40.0)

## 2012-12-31 LAB — BASIC METABOLIC PANEL
BUN: 14 mg/dL (ref 6–23)
Creatinine, Ser: 0.9 mg/dL (ref 0.4–1.5)
GFR: 97.62 mL/min (ref >60.00)

## 2012-12-31 LAB — HEPATIC FUNCTION PANEL
ALT: 17 U/L (ref 0–53)
Bilirubin, Direct: 0.1 mg/dL (ref 0.0–0.3)
Total Bilirubin: 0.5 mg/dL (ref 0.3–1.2)

## 2012-12-31 LAB — PSA: PSA: 0.4 ng/mL (ref 0.10–4.00)

## 2013-01-07 ENCOUNTER — Encounter: Payer: BC Managed Care – PPO | Admitting: Family Medicine

## 2013-01-09 ENCOUNTER — Encounter: Payer: Self-pay | Admitting: Family Medicine

## 2013-01-09 ENCOUNTER — Ambulatory Visit (INDEPENDENT_AMBULATORY_CARE_PROVIDER_SITE_OTHER): Payer: BC Managed Care – PPO | Admitting: Family Medicine

## 2013-01-09 VITALS — BP 136/86 | HR 70 | Temp 98.0°F | Ht 67.75 in | Wt 202.5 lb

## 2013-01-09 DIAGNOSIS — Z0000 Encounter for general adult medical examination without abnormal findings: Secondary | ICD-10-CM

## 2013-01-09 NOTE — Progress Notes (Signed)
Nature conservation officer at [facility] [address] [place] [address] Phone: [phone] Fax: [phone]  Date:  01/09/2013   Name:  Richard Grimes   DOB:  11/11/1950   MRN:  [id number] Gender: male Age: 62 y.o.  Primary Physician:  Hannah Beat, MD  Evaluating MD: Hannah Beat, MD   Chief Complaint: Annual Exam   History of Present Illness:  [name] is a 62 y.o. pleasant patient who presents with the following:  CPX: Colon? 35 pound  Wt Readings from Last 3 Encounters:  01/09/13 202 lb 8 oz (91.853 kg)  01/07/12 189 lb 4 oz (85.843 kg)  07/18/11 167 lb 1.9 oz (75.805 kg)   Preventative Health Maintenance Visit:  Health Maintenance Summary Reviewed and updated, unless pt declines services.  Tobacco History Reviewed. Alcohol: No concerns, no excessive use Exercise Habits: Some activity, rec at least 30 mins 5 times a week STD concerns: no risk or activity to increase risk Drug Use: None Encouraged self-testicular check  Health Maintenance  Topic Date Due  . Colon Cancer Screening Annual Fobt  09/12/2002  . Colonoscopy  09/12/2002  . Zostavax  09/11/2012  . Influenza Vaccine  03/02/2013  . Tetanus/tdap  02/19/2015  . Pneumococcal Polysaccharide Vaccine (#2) 01/04/2016    Labs reviewed with the patient.  Results for orders placed in visit on 12/31/12  LIPID PANEL      Result Value Range   Cholesterol 162  0 - 200 mg/dL   Triglycerides 86.5  0.0 - 149.0 mg/dL   HDL 78.46  >96.29 mg/dL   VLDL 52.8  0.0 - 41.3 mg/dL   LDL Cholesterol 244 (*) 0 - 99 mg/dL   Total CHOL/HDL Ratio 4    HEPATIC FUNCTION PANEL      Result Value Range   Total Bilirubin 0.5  0.3 - 1.2 mg/dL   Bilirubin, Direct 0.1  0.0 - 0.3 mg/dL   Alkaline Phosphatase 109  39 - 117 U/L   AST 16  0 - 37 U/L   ALT 17  0 - 53 U/L   Total Protein 6.6  6.0 - 8.3 g/dL   Albumin 4.0  3.5 - 5.2 g/dL  CBC WITH DIFFERENTIAL      Result Value Range   WBC 5.2  4.5 - 10.5 K/uL   RBC  5.54  4.22 - 5.81 Mil/uL   Hemoglobin 15.5  13.0 - 17.0 g/dL   HCT 01.0  27.2 - 53.6 %   MCV 83.7  78.0 - 100.0 fl   MCHC 33.5  30.0 - 36.0 g/dL   RDW 64.4  03.4 - 74.2 %   Platelets 218.0  150.0 - 400.0 K/uL   Neutrophils Relative % 56.6  43.0 - 77.0 %   Lymphocytes Relative 25.6  12.0 - 46.0 %   Monocytes Relative 11.7  3.0 - 12.0 %   Eosinophils Relative 5.1 (*) 0.0 - 5.0 %   Basophils Relative 1.0  0.0 - 3.0 %   Neutro Abs 2.9  1.4 - 7.7 K/uL   Lymphs Abs 1.3  0.7 - 4.0 K/uL   Monocytes Absolute 0.6  0.1 - 1.0 K/uL   Eosinophils Absolute 0.3  0.0 - 0.7 K/uL   Basophils Absolute 0.0  0.0 - 0.1 K/uL  BASIC METABOLIC PANEL      Result Value Range   Sodium 139  135 - 145 mEq/L   Potassium 4.1  3.5 - 5.1 mEq/L   Chloride 104  96 -  112 mEq/L   CO2 30  19 - 32 mEq/L   Glucose, Bld 107 (*) 70 - 99 mg/dL   BUN 14  6 - 23 mg/dL   Creatinine, Ser 0.9  0.4 - 1.5 mg/dL   Calcium 9.3  8.4 - 16.1 mg/dL   GFR 09.60  >45.40 mL/min  PSA      Result Value Range   PSA 0.40  0.10 - 4.00 ng/mL      Patient Active Problem List   Diagnosis Date Noted  . Allergic rhinitis due to pollen 07/18/2011  . DEPRESSION 05/24/2009  . ASTHMA, CHILDHOOD 05/24/2009  . COPD 05/24/2009  . GERD 05/24/2009    Past Medical History  Diagnosis Date  . GERD (gastroesophageal reflux disease)   . COPD (chronic obstructive pulmonary disease)     Severe, based on Spirometry.   . Depression   . Allergic rhinitis due to pollen 07/18/2011    Past Surgical History  Procedure Laterality Date  . Nasal polyp surgery    . Cardiac catheterization  2000    Normal coronaries    History   Social History  . Marital Status: Divorced    Spouse Name: Carney Corners    Number of Children: N/A  . 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 Smoking status: Former Smoker    Quit date: 07/02/2004  . Smokeless tobacco: Not on file  . Alcohol Use: No    . Drug Use: No  . Sexually Active: Yes -- Male partner(s)   Other Topics Concern  . Not on file   Social History Narrative  . No narrative on file    No family history on file.  Allergies  Allergen Reactions  . Penicillins Rash    Medication list has been reviewed and updated.  Outpatient Prescriptions Prior to Visit  Medication Sig Dispense Refill  . DULERA 200-5 MCG/ACT AERO INHALE TWO PUFFS TWICE DAILY  13 g  10  . FLUoxetine (PROZAC) 20 MG capsule TAKE ONE CAPSULE BY MOUTH EVERY DAY  90 capsule  1  . fluticasone (FLONASE) 50 MCG/ACT nasal spray USE TWO SPRAYS IN EACH NOSTRIL ONCE DAILY  48 g  0  . omeprazole (PRILOSEC) 20 MG capsule Take 20 mg by mouth daily.        Marland Kitchen SPIRIVA HANDIHALER 18 MCG inhalation capsule INHALE ONE DOSE EVERY DAY  30 capsule  10  . FLUoxetine (PROZAC) 20 MG tablet Take 1 tablet (20 mg total) by mouth daily.  90 tablet  3   No facility-administered medications prior to visit.    Review of Systems:   General: Denies fever, chills, sweats. No significant weight loss. Eyes: Denies blurring,significant itching ENT: Denies earache, sore throat, and hoarseness. Cardiovascular: Denies chest pains, palpitations, dyspnea on exertion Respiratory: Denies cough, dyspnea at rest,wheeezing Breast: no concerns about lumps GI: Denies nausea, vomiting, diarrhea, constipation, change in bowel habits, abdominal pain, melena, hematochezia GU: Denies penile discharge, ED, urinary flow / outflow problems. No STD concerns. Musculoskeletal: Denies back pain, joint pain Derm: Denies rash, itching Neuro: Denies  paresthesias, frequent falls, frequent headaches Psych: Denies depression, anxiety Endocrine: Denies cold intolerance, heat intolerance, polydipsia Heme: Denies enlarged lymph nodes Allergy: No hayfever  Physical Examination: Ht 5' 7.75" (1.721 m)  Wt 202 lb 8 oz (91.853 kg)  BMI 31.01 kg/m2  Ideal Body Weight: Weight in (lb) to have BMI = 25:  162.9  Wt Readings from Last 3 Encounters:  01/09/13 202 lb 8 oz (91.853 kg)  01/07/12 189 lb 4 oz (85.843 kg)  07/18/11 167 lb 1.9 oz (75.805 kg)    GEN: well developed, well nourished, no acute distress Eyes: conjunctiva and lids normal, PERRLA, EOMI ENT: TM clear, nares clear, oral exam WNL Neck: supple, no lymphadenopathy, no thyromegaly, no JVD Pulm: clear to auscultation and percussion, respiratory effort normal CV: regular rate and rhythm, S1-S2, no murmur, rub or gallop, no bruits, peripheral pulses normal and symmetric, no cyanosis, clubbing, edema or varicosities Chest: no scars, masses GI: soft, non-tender; no hepatosplenomegaly, masses; active bowel sounds all quadrants GU: no hernia, testicular mass, penile discharge, or prostate enlargement Lymph: no cervical, axillary or inguinal adenopathy MSK: gait normal, muscle tone and strength WNL, no joint swelling, effusions, discoloration, crepitus  SKIN: clear, good turgor, color WNL, no rashes, lesions, or ulcerations Neuro: normal mental status, normal strength, sensation, and motion Psych: alert; oriented to person, place and time, normally interactive and not anxious or depressed in appearance.  Assessment and Plan:  Routine general medical examination at a health care facility  The patient's preventative maintenance and recommended screening tests for an annual wellness exam were reviewed in full today. Brought up to date unless services declined.  Counselled on the importance of diet, exercise, and its role in overall health and mortality. The patient's FH and SH was reviewed, including their home life, tobacco status, and drug and alcohol status.   Is doing well. He has had quite a bit a weight gain. He is going work on his diet. He is going to do an IFOB. He declines colonoscopy.  Orders Today:  No orders of the defined types were placed in this encounter.    Updated Medication List: (Includes new medications,  updates to list, dose adjustments) Meds ordered this encounter  Medications  . Multiple Vitamin (MULTIVITAMIN) tablet    Sig: Take 1 tablet by mouth daily.    Medications Discontinued: Medications Discontinued During This Encounter  Medication Reason  . FLUoxetine (PROZAC) 20 MG tablet Duplicate      Signed, Gwyn Hieronymus T. Cailynn Bodnar, MD 01/09/2013 8:40 AM

## 2013-01-12 ENCOUNTER — Other Ambulatory Visit (INDEPENDENT_AMBULATORY_CARE_PROVIDER_SITE_OTHER): Payer: BC Managed Care – PPO

## 2013-01-12 DIAGNOSIS — Z1289 Encounter for screening for malignant neoplasm of other sites: Secondary | ICD-10-CM

## 2013-01-14 ENCOUNTER — Encounter: Payer: Self-pay | Admitting: *Deleted

## 2013-01-14 ENCOUNTER — Other Ambulatory Visit: Payer: Self-pay | Admitting: Family Medicine

## 2013-01-14 DIAGNOSIS — Z1289 Encounter for screening for malignant neoplasm of other sites: Secondary | ICD-10-CM

## 2013-04-09 ENCOUNTER — Other Ambulatory Visit: Payer: Self-pay | Admitting: Family Medicine

## 2013-05-07 ENCOUNTER — Other Ambulatory Visit: Payer: Self-pay

## 2013-05-13 ENCOUNTER — Other Ambulatory Visit: Payer: Self-pay | Admitting: Family Medicine

## 2013-05-27 ENCOUNTER — Other Ambulatory Visit: Payer: Self-pay | Admitting: Family Medicine

## 2013-08-06 ENCOUNTER — Other Ambulatory Visit: Payer: Self-pay | Admitting: *Deleted

## 2013-08-06 MED ORDER — FLUOXETINE HCL 20 MG PO CAPS: ORAL_CAPSULE | ORAL | Status: DC

## 2013-08-25 ENCOUNTER — Other Ambulatory Visit: Payer: Self-pay | Admitting: Family Medicine

## 2013-09-21 ENCOUNTER — Other Ambulatory Visit: Payer: Self-pay | Admitting: Family Medicine

## 2013-11-10 ENCOUNTER — Other Ambulatory Visit: Payer: Self-pay | Admitting: Family Medicine

## 2013-11-29 ENCOUNTER — Other Ambulatory Visit: Payer: Self-pay | Admitting: Family Medicine

## 2013-12-30 ENCOUNTER — Other Ambulatory Visit: Payer: Self-pay | Admitting: Family Medicine

## 2013-12-30 DIAGNOSIS — Z79899 Other long term (current) drug therapy: Secondary | ICD-10-CM

## 2013-12-30 DIAGNOSIS — Z125 Encounter for screening for malignant neoplasm of prostate: Secondary | ICD-10-CM

## 2013-12-30 DIAGNOSIS — Z13220 Encounter for screening for lipoid disorders: Secondary | ICD-10-CM

## 2014-01-04 ENCOUNTER — Other Ambulatory Visit (INDEPENDENT_AMBULATORY_CARE_PROVIDER_SITE_OTHER): Payer: BC Managed Care – PPO

## 2014-01-04 DIAGNOSIS — Z125 Encounter for screening for malignant neoplasm of prostate: Secondary | ICD-10-CM

## 2014-01-04 DIAGNOSIS — Z13220 Encounter for screening for lipoid disorders: Secondary | ICD-10-CM

## 2014-01-04 DIAGNOSIS — Z79899 Other long term (current) drug therapy: Secondary | ICD-10-CM

## 2014-01-04 LAB — HEPATIC FUNCTION PANEL
ALK PHOS: 101 U/L (ref 39–117)
ALT: 18 U/L (ref 0–53)
AST: 19 U/L (ref 0–37)
Albumin: 4 g/dL (ref 3.5–5.2)
BILIRUBIN TOTAL: 0.6 mg/dL (ref 0.2–1.2)
Bilirubin, Direct: 0.1 mg/dL (ref 0.0–0.3)
Total Protein: 6.6 g/dL (ref 6.0–8.3)

## 2014-01-04 LAB — CBC WITH DIFFERENTIAL/PLATELET
Basophils Absolute: 0 10*3/uL (ref 0.0–0.1)
Basophils Relative: 0.6 % (ref 0.0–3.0)
Eosinophils Absolute: 0.2 10*3/uL (ref 0.0–0.7)
Eosinophils Relative: 2.5 % (ref 0.0–5.0)
HEMATOCRIT: 45.6 % (ref 39.0–52.0)
Hemoglobin: 15.2 g/dL (ref 13.0–17.0)
Lymphocytes Relative: 23.4 % (ref 12.0–46.0)
Lymphs Abs: 1.5 10*3/uL (ref 0.7–4.0)
MCHC: 33.4 g/dL (ref 30.0–36.0)
MCV: 83.8 fl (ref 78.0–100.0)
MONO ABS: 0.7 10*3/uL (ref 0.1–1.0)
MONOS PCT: 10.2 % (ref 3.0–12.0)
NEUTROS ABS: 4.1 10*3/uL (ref 1.4–7.7)
Neutrophils Relative %: 63.3 % (ref 43.0–77.0)
Platelets: 241 10*3/uL (ref 150.0–400.0)
RBC: 5.43 Mil/uL (ref 4.22–5.81)
RDW: 14.3 % (ref 11.5–15.5)
WBC: 6.5 10*3/uL (ref 4.0–10.5)

## 2014-01-04 LAB — BASIC METABOLIC PANEL
BUN: 13 mg/dL (ref 6–23)
CALCIUM: 9.5 mg/dL (ref 8.4–10.5)
CO2: 26 meq/L (ref 19–32)
Chloride: 107 mEq/L (ref 96–112)
Creatinine, Ser: 0.9 mg/dL (ref 0.4–1.5)
GFR: 93.48 mL/min (ref >60.00)
GLUCOSE: 101 mg/dL — AB (ref 70–99)
Potassium: 4.8 mEq/L (ref 3.5–5.1)
Sodium: 140 mEq/L (ref 135–145)

## 2014-01-04 LAB — LIPID PANEL
CHOL/HDL RATIO: 4
Cholesterol: 165 mg/dL (ref 0–200)
HDL: 42 mg/dL (ref >39.00)
LDL Cholesterol: 107 mg/dL — ABNORMAL HIGH (ref 0–99)
NONHDL: 123
TRIGLYCERIDES: 80 mg/dL (ref 0.0–149.0)
VLDL: 16 mg/dL (ref 0.0–40.0)

## 2014-01-04 LAB — PSA: PSA: 0.4 ng/mL (ref 0.10–4.00)

## 2014-01-11 ENCOUNTER — Encounter: Payer: BC Managed Care – PPO | Admitting: Family Medicine

## 2014-01-13 ENCOUNTER — Encounter: Payer: BC Managed Care – PPO | Admitting: Family Medicine

## 2014-01-13 ENCOUNTER — Encounter: Payer: Self-pay | Admitting: Family Medicine

## 2014-01-13 NOTE — Telephone Encounter (Signed)
Morey Hummingbird,  Can you please reschedule his CPE.  Thanks, Butch Penny

## 2014-01-20 ENCOUNTER — Ambulatory Visit (INDEPENDENT_AMBULATORY_CARE_PROVIDER_SITE_OTHER): Payer: BC Managed Care – PPO | Admitting: Family Medicine

## 2014-01-20 ENCOUNTER — Encounter: Payer: Self-pay | Admitting: Family Medicine

## 2014-01-20 VITALS — BP 130/82 | HR 80 | Temp 98.9°F | Ht 67.0 in | Wt 202.0 lb

## 2014-01-20 DIAGNOSIS — Z23 Encounter for immunization: Secondary | ICD-10-CM

## 2014-01-20 DIAGNOSIS — Z1211 Encounter for screening for malignant neoplasm of colon: Secondary | ICD-10-CM

## 2014-01-20 DIAGNOSIS — Z0000 Encounter for general adult medical examination without abnormal findings: Secondary | ICD-10-CM

## 2014-01-20 DIAGNOSIS — Z2911 Encounter for prophylactic immunotherapy for respiratory syncytial virus (RSV): Secondary | ICD-10-CM

## 2014-01-20 NOTE — Progress Notes (Signed)
Pre visit review using our clinic review tool, if applicable. No additional management support is needed unless otherwise documented below in the visit note. 

## 2014-01-20 NOTE — Progress Notes (Signed)
[place] [address] Phone: [phone] Fax: [phone]  Patient ID: Richard Grimes MRN: [id number], DOB: 1950/09/14, 63 y.o. Date of Encounter: 01/20/2014  Primary Physician:  [name], MD   Chief Complaint: Annual Exam   Subjective:   History of Present Illness:  Richard Grimes is a 63 y.o. pleasant patient who presents with the following:  Preventative Health Maintenance Visit:  Health Maintenance Summary Reviewed and updated, unless pt declines services.  Weight still up a little, otherwise he is doing great.  Breathing good.  Tobacco History Reviewed. 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 STD concerns: no risk or activity to increase risk Drug Use: None Encouraged self-testicular check  Health Maintenance  Topic Date Due  . Colonoscopy  09/12/2002  . Zostavax  09/11/2012  . Colon Cancer Screening Annual Fobt  01/09/2014  . Influenza Vaccine  01/30/2014  . Tetanus/tdap  02/19/2015  . Pneumococcal Polysaccharide Vaccine (##2) 01/04/2016    Immunization History  Administered Date(s) Administered  . Influenza Whole 05/24/2009  . Pneumococcal Conjugate-13 01/20/2014  . Pneumococcal Polysaccharide-23 01/04/2011  . Zoster 01/20/2014    Patient Active Problem List   Diagnosis Date Noted  . Allergic rhinitis due to pollen 07/18/2011  . DEPRESSION 05/24/2009  . ASTHMA, CHILDHOOD 05/24/2009  . COPD 05/24/2009  . GERD 05/24/2009   Past Medical History  Diagnosis Date  . GERD (gastroesophageal reflux disease)   . COPD (chronic obstructive pulmonary disease)     Severe, based on Spirometry.   . Depression   . Allergic rhinitis due to pollen 07/18/2011   Past Surgical History  Procedure Laterality Date  . Nasal polyp surgery    . Cardiac catheterization  2000    Normal coronaries   History   Social History  . Marital Status: Divorced    Spouse Name: [name]   Number of Children: N/A  . 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 Smoking status: Former Smoker    Quit date: 07/02/2004  . Smokeless tobacco: Not on file  . Alcohol Use: No  . Drug Use: No  . Sexual Activity: Yes    Partners: Female   Other Topics Concern  . Not on file   Social History Narrative  . No narrative on file   No family history on file. Allergies  Allergen Reactions  . Penicillins Rash    Medication list has been reviewed and updated.  Review of Systems:  General: Denies fever, chills, sweats. No significant weight loss. Eyes: Denies blurring,significant itching ENT: Denies earache, sore throat, and hoarseness. Cardiovascular: Denies chest pains, palpitations, dyspnea on exertion Respiratory: Denies cough, dyspnea at rest,wheeezing Breast: no concerns about lumps GI: Denies nausea, vomiting, diarrhea, constipation, change in bowel habits, abdominal pain, melena, hematochezia GU: Denies penile discharge, ED, urinary flow / outflow problems. No STD concerns. Musculoskeletal: Denies back pain, joint pain Derm: Denies rash, itching Neuro: Denies  paresthesias, frequent falls, frequent headaches Psych: Denies depression, anxiety Endocrine: Denies cold intolerance, heat intolerance, polydipsia Heme: Denies enlarged lymph nodes Allergy: No hayfever  Objective:   Physical Examination: BP 130/82  Pulse 80  Temp(Src) 98.9 F (37.2 C) (Oral)  Ht '5\' 7"'  (1.702 m)  Wt 202 lb (91.627 kg)  BMI 31.63 kg/m2  SpO2 94% Ideal Body Weight: Weight in (lb) to have BMI = 25: 159.3  No exam data  present  GEN: well developed, well nourished, no acute distress Eyes: conjunctiva and lids normal, PERRLA, EOMI ENT: TM clear, nares clear, oral exam WNL Neck: supple, no lymphadenopathy, no thyromegaly, no JVD Pulm: clear to auscultation and percussion, respiratory effort normal CV: regular rate and  rhythm, S1-S2, no murmur, rub or gallop, no bruits, peripheral pulses normal and symmetric, no cyanosis, clubbing, edema or varicosities GI: soft, non-tender; no hepatosplenomegaly, masses; active bowel sounds all quadrants GU: no hernia, testicular mass, penile discharge Lymph: no cervical, axillary or inguinal adenopathy MSK: gait normal, muscle tone and strength WNL, no joint swelling, effusions, discoloration, crepitus  SKIN: clear, good turgor, color WNL, no rashes, lesions, or ulcerations Neuro: normal mental status, normal strength, sensation, and motion Psych: alert; oriented to person, place and time, normally interactive and not anxious or depressed in appearance.  All labs reviewed with patient.  Lipids:    Component Value Date/Time   CHOL 165 01/04/2014 1031   TRIG 80.0 01/04/2014 1031   HDL 42.00 01/04/2014 1031   LDLDIRECT 85.4 01/04/2011 1054   VLDL 16.0 01/04/2014 1031   CHOLHDL 4 01/04/2014 1031   CBC: CBC Latest Ref Rng 01/04/2014 12/31/2012 01/01/2012  WBC 4.0 - 10.5 K/uL 6.5 5.2 5.2  Hemoglobin 13.0 - 17.0 g/dL 15.2 15.5 14.4  Hematocrit 39.0 - 52.0 % 45.6 46.4 43.4  Platelets 150.0 - 400.0 K/uL 241.0 218.0 196.2    Basic Metabolic Panel:    Component Value Date/Time   NA 140 01/04/2014 1031   K 4.8 01/04/2014 1031   CL 107 01/04/2014 1031   CO2 26 01/04/2014 1031   BUN 13 01/04/2014 1031   CREATININE 0.9 01/04/2014 1031   GLUCOSE 101* 01/04/2014 1031   CALCIUM 9.5 01/04/2014 1031   Hepatic Function Latest Ref Rng 01/04/2014 12/31/2012 01/01/2012  Total Protein 6.0 - 8.3 g/dL 6.6 6.6 6.4  Albumin 3.5 - 5.2 g/dL 4.0 4.0 3.8  AST 0 - 37 U/L '19 16 21  ' ALT 0 - 53 U/L '18 17 19  ' Alk Phosphatase 39 - 117 U/L 101 109 92  Total Bilirubin 0.2 - 1.2 mg/dL 0.6 0.5 0.5  Bilirubin, Direct 0.0 - 0.3 mg/dL 0.1 0.1 0.1    Lab Results  Component Value Date   TSH 1.70 01/01/2012   Lab Results  Component Value Date   PSA 0.40 01/04/2014   PSA 0.40 12/31/2012   PSA 0.53 01/01/2012    Assessment & Plan:     Routine general medical examination at a health care facility  Special screening for malignant neoplasms, colon - Plan: Fecal occult blood, imunochemical, CANCELED: Fecal occult blood, imunochemical  Need for prophylactic vaccination against Streptococcus pneumoniae (pneumococcus) - Plan: Pneumococcal conjugate vaccine 13-valent  Need for prophylactic vaccination and inoculation against varicella - Plan: Varicella-zoster vaccine subcutaneous  Health Maintenance Exam: The patient's preventative maintenance and recommended screening tests for an annual wellness exam were reviewed in full today. Brought up to date unless services declined.  Counselled on the importance of diet, exercise, and its role in overall health and mortality. The patient's FH and SH was reviewed, including their home life, tobacco status, and drug and alcohol status.  Doing great.  Follow-up: No Follow-up on file. Unless noted, follow-up in 1 year for Health Maintenance Exam.  New Prescriptions   No medications on file   Modified Medications   No medications on file   Orders Placed This Encounter  Procedures  . Fecal occult blood, imunochemical  . Pneumococcal conjugate vaccine  13-valent  . Varicella-zoster vaccine subcutaneous    Signed,  Kaely Hollan T. Khamila Bassinger, MD, CAQ Sports Medicine   Discontinued Medications   No medications on file   Current Medications at Discharge:   Medication List       This list is accurate as of: 01/20/14  6:18 PM.  Always use your most recent med list.               DULERA 200-5 MCG/ACT Aero  Generic drug:  mometasone-formoterol  INHALE TWO PUFFS TWICE DAILY     FLUoxetine 20 MG capsule  Commonly known as:  PROZAC  TAKE ONE CAPSULE BY MOUTH ONCE DAILY     fluticasone 50 MCG/ACT nasal spray  Commonly known as:  FLONASE  USE TWO SPRAY(S) IN EACH NOSTRIL ONCE DAILY     multivitamin tablet  Take 1 tablet by mouth daily.     omeprazole 20 MG capsule   Commonly known as:  PRILOSEC  Take 20 mg by mouth daily.     SPIRIVA HANDIHALER 18 MCG inhalation capsule  Generic drug:  tiotropium  INHALE ONE DOSE ONCE DAILY

## 2014-01-24 ENCOUNTER — Other Ambulatory Visit: Payer: Self-pay | Admitting: Family Medicine

## 2014-02-05 ENCOUNTER — Other Ambulatory Visit (INDEPENDENT_AMBULATORY_CARE_PROVIDER_SITE_OTHER): Payer: BC Managed Care – PPO

## 2014-02-05 DIAGNOSIS — Z1211 Encounter for screening for malignant neoplasm of colon: Secondary | ICD-10-CM

## 2014-02-05 LAB — FECAL OCCULT BLOOD, IMMUNOCHEMICAL: FECAL OCCULT BLD: NEGATIVE

## 2014-02-24 ENCOUNTER — Other Ambulatory Visit: Payer: Self-pay | Admitting: Family Medicine

## 2014-04-16 ENCOUNTER — Other Ambulatory Visit: Payer: Self-pay

## 2014-07-31 ENCOUNTER — Other Ambulatory Visit: Payer: Self-pay | Admitting: Family Medicine

## 2014-09-02 ENCOUNTER — Other Ambulatory Visit: Payer: Self-pay | Admitting: Family Medicine

## 2015-01-17 ENCOUNTER — Other Ambulatory Visit (INDEPENDENT_AMBULATORY_CARE_PROVIDER_SITE_OTHER): Payer: BLUE CROSS/BLUE SHIELD

## 2015-01-17 DIAGNOSIS — E785 Hyperlipidemia, unspecified: Secondary | ICD-10-CM

## 2015-01-17 DIAGNOSIS — Z125 Encounter for screening for malignant neoplasm of prostate: Secondary | ICD-10-CM

## 2015-01-17 DIAGNOSIS — Z79899 Other long term (current) drug therapy: Secondary | ICD-10-CM

## 2015-01-17 LAB — LIPID PANEL
CHOL/HDL RATIO: 4
Cholesterol: 161 mg/dL (ref 0–200)
HDL: 41.8 mg/dL (ref >39.00)
LDL Cholesterol: 95 mg/dL (ref 0–99)
NonHDL: 119.2
Triglycerides: 119 mg/dL (ref 0.0–149.0)
VLDL: 23.8 mg/dL (ref 0.0–40.0)

## 2015-01-17 LAB — HEPATIC FUNCTION PANEL
ALK PHOS: 105 U/L (ref 39–117)
ALT: 22 U/L (ref 0–53)
AST: 18 U/L (ref 0–37)
Albumin: 3.9 g/dL (ref 3.5–5.2)
Bilirubin, Direct: 0.1 mg/dL (ref 0.0–0.3)
TOTAL PROTEIN: 6.2 g/dL (ref 6.0–8.3)
Total Bilirubin: 0.4 mg/dL (ref 0.2–1.2)

## 2015-01-17 LAB — BASIC METABOLIC PANEL
BUN: 11 mg/dL (ref 6–23)
CALCIUM: 9.1 mg/dL (ref 8.4–10.5)
CHLORIDE: 107 meq/L (ref 96–112)
CO2: 26 meq/L (ref 19–32)
CREATININE: 0.83 mg/dL (ref 0.40–1.50)
GFR: 99.67 mL/min (ref >60.00)
Glucose, Bld: 99 mg/dL (ref 70–99)
Potassium: 4.2 mEq/L (ref 3.5–5.1)
Sodium: 141 mEq/L (ref 135–145)

## 2015-01-17 LAB — CBC WITH DIFFERENTIAL/PLATELET
Basophils Absolute: 0 10*3/uL (ref 0.0–0.1)
Basophils Relative: 0.6 % (ref 0.0–3.0)
Eosinophils Absolute: 0.3 10*3/uL (ref 0.0–0.7)
Eosinophils Relative: 5.2 % — ABNORMAL HIGH (ref 0.0–5.0)
HEMATOCRIT: 45.5 % (ref 39.0–52.0)
Hemoglobin: 15.1 g/dL (ref 13.0–17.0)
LYMPHS ABS: 1.4 10*3/uL (ref 0.7–4.0)
Lymphocytes Relative: 26 % (ref 12.0–46.0)
MCHC: 33.2 g/dL (ref 30.0–36.0)
MCV: 83.7 fl (ref 78.0–100.0)
MONO ABS: 0.5 10*3/uL (ref 0.1–1.0)
MONOS PCT: 9.5 % (ref 3.0–12.0)
NEUTROS PCT: 58.7 % (ref 43.0–77.0)
Neutro Abs: 3.1 10*3/uL (ref 1.4–7.7)
PLATELETS: 234 10*3/uL (ref 150.0–400.0)
RBC: 5.43 Mil/uL (ref 4.22–5.81)
RDW: 14.7 % (ref 11.5–15.5)
WBC: 5.2 10*3/uL (ref 4.0–10.5)

## 2015-01-17 LAB — PSA: PSA: 0.58 ng/mL (ref 0.10–4.00)

## 2015-01-18 ENCOUNTER — Other Ambulatory Visit: Payer: Self-pay | Admitting: Family Medicine

## 2015-01-24 ENCOUNTER — Encounter: Payer: Self-pay | Admitting: Family Medicine

## 2015-01-24 ENCOUNTER — Telehealth: Payer: Self-pay | Admitting: Family Medicine

## 2015-01-24 ENCOUNTER — Encounter: Payer: Self-pay | Admitting: Radiology

## 2015-01-24 ENCOUNTER — Ambulatory Visit (INDEPENDENT_AMBULATORY_CARE_PROVIDER_SITE_OTHER): Payer: BLUE CROSS/BLUE SHIELD | Admitting: Family Medicine

## 2015-01-24 VITALS — BP 120/74 | HR 81 | Temp 97.6°F | Ht 67.0 in | Wt 201.8 lb

## 2015-01-24 DIAGNOSIS — L570 Actinic keratosis: Secondary | ICD-10-CM | POA: Diagnosis not present

## 2015-01-24 DIAGNOSIS — Z1211 Encounter for screening for malignant neoplasm of colon: Secondary | ICD-10-CM | POA: Diagnosis not present

## 2015-01-24 DIAGNOSIS — Z0000 Encounter for general adult medical examination without abnormal findings: Secondary | ICD-10-CM

## 2015-01-24 MED ORDER — ALBUTEROL SULFATE HFA 108 (90 BASE) MCG/ACT IN AERS: 2.0000 | INHALATION_SPRAY | Freq: Four times a day (QID) | RESPIRATORY_TRACT | Status: DC | PRN

## 2015-01-24 NOTE — Telephone Encounter (Signed)
Can you mail to him

## 2015-01-24 NOTE — Telephone Encounter (Signed)
Patient had a physical this morning.  Patient said he didn't receive hemacoult cards.  Please mail card to patient.

## 2015-01-24 NOTE — Progress Notes (Signed)
Dr. Frederico Hamman T. Seham Gardenhire, MD, [facility] [place], [address] Phone: [phone] Fax: [phone]  01/24/2015  Patient: Richard Grimes, MRN: [id number], DOB: 1950-07-08, 64 y.o.  Primary Physician:  [name], MD  Chief Complaint: Annual Exam  Subjective:   Richard Grimes is a 64 y.o. pleasant patient who presents with the following:  Preventative Health Maintenance Visit:  Health Maintenance Summary Reviewed and updated, unless pt declines services.  Tobacco History Reviewed. 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 STD concerns: no risk or activity to increase risk Drug Use: None Encouraged self-testicular check  AK on head. This is been present and worsening for the last few months.  It is scaly and little bit itchy with some intermittent healing.  He is bald and in the sun quite a bit. Cryotherapy x 1  Albuterol refilled.   Colon.  (IFOB) Declines colonoscopy   Health Maintenance  Topic Date Due  . HIV Screening  09/12/1967  . COLONOSCOPY  09/12/2002  . COLON CANCER SCREENING ANNUAL FOBT  01/09/2014  . INFLUENZA VACCINE  01/31/2015  . TETANUS/TDAP  02/19/2015  . PNEUMOCOCCAL POLYSACCHARIDE VACCINE (2) 01/04/2016  . ZOSTAVAX  Completed   Immunization History  Administered Date(s) Administered  . Influenza Whole 05/24/2009  . Pneumococcal Conjugate-13 01/20/2014  . Pneumococcal Polysaccharide-23 01/04/2011  . Zoster 01/20/2014   Patient Active Problem List   Diagnosis Date Noted  . Allergic rhinitis due to pollen 07/18/2011  . DEPRESSION 05/24/2009  . ASTHMA, CHILDHOOD 05/24/2009  . COPD 05/24/2009  . GERD 05/24/2009   Past Medical History  Diagnosis Date  . GERD (gastroesophageal reflux disease)   . COPD (chronic obstructive pulmonary disease)     Severe, based on Spirometry.   . Depression   . Allergic  rhinitis due to pollen 07/18/2011   Past Surgical History  Procedure Laterality Date  . Nasal polyp surgery    . Cardiac catheterization  2000    Normal coronaries   History   Social History  . Marital Status: Divorced    Spouse Name: [name]  . Number of Children: N/A  . 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 Smoking status: Former Smoker    Quit date: 07/02/2004  . Smokeless tobacco: Never Used  . Alcohol Use: 0.0 oz/week    0 Standard drinks or equivalent per week     Comment: occ glass on wine  . Drug Use: No  . Sexual Activity:    Partners: Female   Other Topics Concern  . Not on file   Social History Narrative   No family history on file. Allergies  Allergen Reactions  . Penicillins Rash    Medication list has been reviewed and updated.   General: Denies fever, chills, sweats. No significant weight loss. Eyes: Denies blurring,significant itching ENT: Denies earache, sore throat, and hoarseness. Cardiovascular: Denies chest pains, palpitations, dyspnea on exertion Respiratory: Denies cough, dyspnea at rest,wheeezing Breast: no concerns about lumps GI: Denies nausea, vomiting, diarrhea, constipation, change in bowel habits, abdominal pain, melena, hematochezia GU: Denies penile discharge, ED, urinary flow / outflow problems. No STD concerns. Musculoskeletal: Denies back pain, joint pain Derm: AS ABOVE, SMALL SCALY PATCH ON FOREHEAD Neuro: Denies  paresthesias, frequent falls, frequent headaches Psych: Denies depression, anxiety Endocrine: Denies cold intolerance,  heat intolerance, polydipsia Heme: Denies enlarged lymph nodes Allergy: No hayfever  Objective:   BP 120/74 mmHg  Pulse 81  Temp(Src) 97.6 F (36.4 C) (Oral)  Ht _0  (1.702 m)  Wt 201 lb 12 oz (91.513 kg)  BMI 31.59 kg/m2 Ideal Body Weight: Weight in (lb) to have BMI = 25: 159.3  No exam data present  GEN:  well developed, well nourished, no acute distress Eyes: conjunctiva and lids normal, PERRLA, EOMI ENT: TM clear, nares clear, oral exam WNL Neck: supple, no lymphadenopathy, no thyromegaly, no JVD Pulm: clear to auscultation and percussion, respiratory effort normal CV: regular rate and rhythm, S1-S2, no murmur, rub or gallop, no bruits, peripheral pulses normal and symmetric, no cyanosis, clubbing, edema or varicosities GI: soft, non-tender; no hepatosplenomegaly, masses; active bowel sounds all quadrants GU: no hernia, testicular mass, penile discharge Lymph: no cervical, axillary or inguinal adenopathy MSK: gait normal, muscle tone and strength WNL, no joint swelling, effusions, discoloration, crepitus  SKIN: clear, good turgor, color WNL. FRONT OF FOREHEAD WITH SMALL AREA APPROX 1 CM ACROSS WITH CRUSTED APPEARANCE Neuro: normal mental status, normal strength, sensation, and motion Psych: alert; oriented to person, place and time, normally interactive and not anxious or depressed in appearance. All labs reviewed with patient.  Lipids:    Component Value Date/Time   CHOL 161 01/17/2015 1108   TRIG 119.0 01/17/2015 1108   HDL 41.80 01/17/2015 1108   LDLDIRECT 85.4 01/04/2011 1054   VLDL 23.8 01/17/2015 1108   CHOLHDL 4 01/17/2015 1108   CBC: CBC Latest Ref Rng 01/17/2015 01/04/2014 12/31/2012  WBC 4.0 - 10.5 K/uL 5.2 6.5 5.2  Hemoglobin 13.0 - 17.0 g/dL 15.1 15.2 15.5  Hematocrit 39.0 - 52.0 % 45.5 45.6 46.4  Platelets 150.0 - 400.0 K/uL 234.0 241.0 831.5    Basic Metabolic Panel:    Component Value Date/Time   NA 141 01/17/2015 1108   K 4.2 01/17/2015 1108   CL 107 01/17/2015 1108   CO2 26 01/17/2015 1108   BUN 11 01/17/2015 1108   CREATININE 0.83 01/17/2015 1108   GLUCOSE 99 01/17/2015 1108   CALCIUM 9.1 01/17/2015 1108   Hepatic Function Latest Ref Rng 01/17/2015 01/04/2014 12/31/2012  Total Protein 6.0 - 8.3 g/dL 6.2 6.6 6.6  Albumin 3.5 - 5.2 g/dL 3.9 4.0 4.0  AST 0 - 37  U/L _1 ALT 0 - 53 U/L _2 Alk Phosphatase 39 - 117 U/L 105 101 109  Total Bilirubin 0.2 - 1.2 mg/dL 0.4 0.6 0.5  Bilirubin, Direct 0.0 - 0.3 mg/dL 0.1 0.1 0.1    Lab Results  Component Value Date   TSH 1.70 01/01/2012   Lab Results  Component Value Date   PSA 0.58 01/17/2015   PSA 0.40 01/04/2014   PSA 0.40 12/31/2012    Assessment and Plan:   Healthcare maintenance  Special screening for malignant neoplasms, colon - Plan: Fecal occult blood, imunochemical, CANCELED: Fecal occult blood, imunochemical  Actinic keratosis: we reviewed different ways in which this could be treated including cryotherapy and possibly [place].  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  Well-educated patient, and he knows that if this lesion comes back at all, then this could potentially need to be repeated.  Cryotherapy  Reason: Actinic Keratosis, forehead Location: middle forehead  Liquid nitrogen was applied using the liquid nitrogen gun without  difficulty x 10 seconds, 2 applications, then for 5 seconds. Tolerated well without complications.   Health Maintenance Exam: The patient's preventative maintenance and recommended screening tests for an annual wellness exam were reviewed in full today. Brought up to date unless services declined.  Counselled on the importance of diet, exercise, and its role in overall health and mortality. The patient's FH and SH was reviewed, including their home life, tobacco status, and drug and alcohol status.  Follow-up: Return in about 1 year (around 01/24/2016). Unless noted, follow-up in 1 year for Health Maintenance Exam.  New Prescriptions   ALBUTEROL (PROVENTIL HFA;VENTOLIN HFA) 108 (90 BASE) MCG/ACT INHALER    Inhale 2 puffs into the lungs every 6 (six) hours as needed for wheezing or shortness of breath.   Orders Placed This  Encounter  Procedures  . Fecal occult blood, imunochemical    Signed,  Keilan Nichol T. Kyros Salzwedel, MD   Patient's Medications  New Prescriptions   ALBUTEROL (PROVENTIL HFA;VENTOLIN HFA) 108 (90 BASE) MCG/ACT INHALER    Inhale 2 puffs into the lungs every 6 (six) hours as needed for wheezing or shortness of breath.  Previous Medications   CHLORHEXIDINE (PERIDEX) 0.12 % SOLUTION       DULERA 200-5 MCG/ACT AERO    INHALE TWO PUFFS TWICE DAILY   FLUOXETINE (PROZAC) 20 MG CAPSULE    TAKE ONE CAPSULE BY MOUTH ONCE DAILY   MULTIPLE VITAMIN (MULTIVITAMIN) TABLET    Take 1 tablet by mouth daily.   OMEPRAZOLE (PRILOSEC) 20 MG CAPSULE    Take 20 mg by mouth daily.     SPIRIVA HANDIHALER 18 MCG INHALATION CAPSULE    INHALE ONE DOSE BY MOUTH ONCE DAILY  Modified Medications   No medications on file  Discontinued Medications   FLUTICASONE (FLONASE) 50 MCG/ACT NASAL SPRAY    USE TWO SPRAY(S) IN EACH NOSTRIL ONCE DAILY

## 2015-01-24 NOTE — Progress Notes (Signed)
Pre visit review using our clinic review tool, if applicable. No additional management support is needed unless otherwise documented below in the visit note. 

## 2015-01-28 ENCOUNTER — Other Ambulatory Visit: Payer: Self-pay | Admitting: Family Medicine

## 2015-02-03 ENCOUNTER — Other Ambulatory Visit (INDEPENDENT_AMBULATORY_CARE_PROVIDER_SITE_OTHER): Payer: BLUE CROSS/BLUE SHIELD

## 2015-02-03 DIAGNOSIS — Z1211 Encounter for screening for malignant neoplasm of colon: Secondary | ICD-10-CM | POA: Diagnosis not present

## 2015-02-03 LAB — FECAL OCCULT BLOOD, IMMUNOCHEMICAL: Fecal Occult Bld: NEGATIVE

## 2015-02-21 ENCOUNTER — Other Ambulatory Visit: Payer: Self-pay | Admitting: Family Medicine

## 2015-06-14 ENCOUNTER — Encounter: Payer: Self-pay | Admitting: Family Medicine

## 2015-06-14 ENCOUNTER — Ambulatory Visit (INDEPENDENT_AMBULATORY_CARE_PROVIDER_SITE_OTHER): Payer: BLUE CROSS/BLUE SHIELD | Admitting: Family Medicine

## 2015-06-14 VITALS — BP 120/80 | HR 83 | Temp 97.9°F | Ht 67.0 in | Wt 207.5 lb

## 2015-06-14 DIAGNOSIS — J441 Chronic obstructive pulmonary disease with (acute) exacerbation: Secondary | ICD-10-CM | POA: Diagnosis not present

## 2015-06-14 MED ORDER — PREDNISONE 20 MG PO TABS: ORAL_TABLET | ORAL | Status: DC

## 2015-06-14 MED ORDER — DOXYCYCLINE HYCLATE 100 MG PO TABS: 100.0000 mg | ORAL_TABLET | Freq: Two times a day (BID) | ORAL | Status: DC

## 2015-06-14 NOTE — Progress Notes (Signed)
   Subjective:    Patient ID: Richard Grimes, male    DOB: 15-Jun-1951, 64 y.o.   MRN: [id number]  Cough This is a new (took an old zpack that he had.) problem. The current episode started in the past 7 days. The problem has been gradually worsening. The cough is productive of sputum. Associated symptoms include headaches, a sore throat, shortness of breath and wheezing. Pertinent negatives include no chills, ear congestion, ear pain or fever. Exacerbated by: not keeping him up at night. Risk factors for lung disease include smoking/tobacco exposure (former smoker). Treatments tried: antibiotics, salt nasl rinse, dulara, spiriva, albuterol inhaler every few days. His past medical history is significant for COPD.      Review of Systems  Constitutional: Negative for fever and chills.  HENT: Positive for sore throat. Negative for ear pain.   Respiratory: Positive for cough, shortness of breath and wheezing.   Neurological: Positive for headaches.       Objective:   Physical Exam  Constitutional: Vital signs are normal. He appears well-developed and well-nourished.  Non-toxic appearance. He does not appear ill. No distress.  HENT:  Head: Normocephalic and atraumatic.  Right Ear: Hearing, tympanic membrane, external ear and ear canal normal. No tenderness. No foreign bodies. Tympanic membrane is not retracted and not bulging.  Left Ear: Hearing, tympanic membrane, external ear and ear canal normal. No tenderness. No foreign bodies. Tympanic membrane is not retracted and not bulging.  Nose: Nose normal. No mucosal edema or rhinorrhea. Right sinus exhibits no maxillary sinus tenderness and no frontal sinus tenderness. Left sinus exhibits no maxillary sinus tenderness and no frontal sinus tenderness.  Mouth/Throat: Uvula is midline, oropharynx is clear and moist and mucous membranes are normal. Normal dentition. No dental caries. No oropharyngeal exudate or tonsillar abscesses.  Eyes: Conjunctivae,  EOM and lids are normal. Pupils are equal, round, and reactive to light. Lids are everted and swept, no foreign bodies found.  Neck: Trachea normal, normal range of motion and phonation normal. Neck supple. Carotid bruit is not present. No thyroid mass and no thyromegaly present.  Cardiovascular: Normal rate, regular rhythm, S1 normal, S2 normal, normal heart sounds, intact distal pulses and normal pulses.  Exam reveals no gallop.   No murmur heard. Pulmonary/Chest: Effort normal. No respiratory distress. He has decreased breath sounds. He has no wheezes. He has no rhonchi. He has no rales.  Moist cough, frequent  Abdominal: Soft. Normal appearance and bowel sounds are normal. There is no hepatosplenomegaly. There is no tenderness. There is no rebound, no guarding and no CVA tenderness. No hernia.  Neurological: He is alert. He has normal reflexes.  Skin: Skin is warm, dry and intact. No rash noted.  Psychiatric: He has a normal mood and affect. His speech is normal and behavior is normal. Judgment normal.          Assessment & Plan:

## 2015-06-14 NOTE — Progress Notes (Signed)
Pre visit review using our clinic review tool, if applicable. No additional management support is needed unless otherwise documented below in the visit note. 

## 2015-06-14 NOTE — Patient Instructions (Signed)
Complete steroid taper.  Complete doxycycline x 10 days.  Call if continued shortness of breath or new fever.

## 2015-06-14 NOTE — Assessment & Plan Note (Signed)
Minimal improvement with Zpak. Decreased air movement. Treat with pred taper, broaden antibiotics to doxy.

## 2015-07-08 ENCOUNTER — Telehealth: Payer: Self-pay | Admitting: Family Medicine

## 2015-07-08 NOTE — Telephone Encounter (Signed)
Error

## 2015-07-11 ENCOUNTER — Encounter: Payer: Self-pay | Admitting: Family Medicine

## 2015-07-11 ENCOUNTER — Encounter: Payer: Self-pay | Admitting: *Deleted

## 2015-07-11 ENCOUNTER — Telehealth: Payer: Self-pay | Admitting: Family Medicine

## 2015-07-11 NOTE — Telephone Encounter (Signed)
Pt called in and said he sent over 2 mychart messages regarding his medications and his cdl physical. He said he needed those letters printed and signed by Dr. Lorelei Pont in order for them to approve his cdl. You can reach him at [phone] with any questions and when ready to be picked up.

## 2015-07-11 NOTE — Telephone Encounter (Signed)
Spoke with [name].   He states he needs a letter from Dr. 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  Richard Grimes advised letter will be ready to pick up this afternoon. Letter written,signed by Dr. Lorelei Pont and placed up front for patient to pick up.

## 2015-07-12 ENCOUNTER — Encounter: Payer: Self-pay | Admitting: Family Medicine

## 2015-07-25 ENCOUNTER — Encounter: Payer: Self-pay | Admitting: Family Medicine

## 2015-07-27 ENCOUNTER — Ambulatory Visit (INDEPENDENT_AMBULATORY_CARE_PROVIDER_SITE_OTHER)
Admission: RE | Admit: 2015-07-27 | Discharge: 2015-07-27 | Disposition: A | Discharge status: Home / Self Care | Payer: BLUE CROSS/BLUE SHIELD | Source: Ambulatory Visit | Attending: Family Medicine | Admitting: Family Medicine

## 2015-07-27 ENCOUNTER — Ambulatory Visit (INDEPENDENT_AMBULATORY_CARE_PROVIDER_SITE_OTHER): Payer: BLUE CROSS/BLUE SHIELD | Admitting: Family Medicine

## 2015-07-27 ENCOUNTER — Encounter: Payer: Self-pay | Admitting: Family Medicine

## 2015-07-27 VITALS — BP 112/72 | HR 89 | Temp 97.9°F | Ht 67.0 in | Wt 211.2 lb

## 2015-07-27 DIAGNOSIS — R05 Cough: Secondary | ICD-10-CM

## 2015-07-27 DIAGNOSIS — R059 Cough, unspecified: Secondary | ICD-10-CM

## 2015-07-27 DIAGNOSIS — J189 Pneumonia, unspecified organism: Secondary | ICD-10-CM

## 2015-07-27 DIAGNOSIS — J441 Chronic obstructive pulmonary disease with (acute) exacerbation: Secondary | ICD-10-CM

## 2015-07-27 MED ORDER — LEVOFLOXACIN 500 MG PO TABS: 500.0000 mg | ORAL_TABLET | Freq: Every day | ORAL | Status: DC

## 2015-07-27 MED ORDER — PREDNISONE 20 MG PO TABS: ORAL_TABLET | ORAL | Status: DC

## 2015-07-27 NOTE — Progress Notes (Signed)
Dr. Frederico Hamman T. Salil Raineri, MD, [facility] [place], [address] Phone: [phone] Fax: [phone]  07/27/2015  Patient: Richard Grimes, MRN: [id number], DOB: 18-Aug-1950, 65 y.o.  Primary Physician:  [name], MD   Chief Complaint  Patient presents with  . Cough    x 1 week   Subjective:   Richard Grimes is a 65 y.o. very pleasant male patient who presents with the following:  Dulera - only doing it in the morning.  Spiriva - forgetting.  COPD exacerbation  Pleasant gentleman who has a history of COPD, former heavy smoking use, and is supposed to be using both Dulera and Spiriva, but he has not been altogether compliant in the last few months.  He saw my partner 5 or 6 weeks ago, was given some doxycycline and a short course of some prednisone.  He has never fully improved, and he is still coughing quite a bit and having some labored breathing.  Past Medical History, Surgical History, Social History, Family History, Problem List, Medications, and Allergies have been reviewed and updated if relevant.  Patient Active Problem List   Diagnosis Date Noted  . COPD exacerbation ([place]) 06/14/2015  . Allergic rhinitis due to pollen 07/18/2011  . DEPRESSION 05/24/2009  . COPD 05/24/2009  . GERD 05/24/2009    Past Medical History  Diagnosis Date  . GERD (gastroesophageal reflux disease)   . COPD (chronic obstructive pulmonary disease) ([facility])     Severe, based on Spirometry.   . Depression   . Allergic rhinitis due to pollen 07/18/2011    Past Surgical History  Procedure Laterality Date  . Nasal polyp surgery    . Cardiac catheterization  2000    Normal coronaries    Social History   Social History  . Marital Status: Divorced    Spouse Name: [name]  . Number of Children: N/A  . 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 Smoking  status: Former Smoker    Quit date: 07/02/2004  . Smokeless tobacco: Never Used  . Alcohol Use: 0.0 oz/week    0 Standard drinks or equivalent per week     Comment: occ glass on wine  . Drug Use: No  . Sexual Activity:    Partners: Female   Other Topics Concern  . Not on file   Social History Narrative    No family history on file.  Allergies  Allergen Reactions  . Penicillins Rash    Medication list reviewed and updated in full in [facility].  ROS: GEN: Acute illness details above GI: Tolerating PO intake GU: maintaining adequate hydration and urination Pulm: No SOB Interactive and getting along well at home.  Otherwise, ROS is as per the HPI.   Objective:   BP 112/72 mmHg  Pulse 89  Temp(Src) 97.9 F (36.6 C) (Oral)  Ht 5\' 7"  (1.702 m)  Wt 211 lb 4 oz (95.822 kg)  BMI 33.08 kg/m2  SpO2 94%   GEN: A and O x 3. WDWN. NAD.    ENT: Nose clear, ext NML.  No LAD.  No JVD.  TM's clear. Oropharynx clear.  PULM: Normal WOB, no distress. No crackles, wheezes, rhonchi. CV: RRR, no M/G/R, No rubs, No JVD.   EXT: warm and well-perfused, No c/c/e. PSYCH: Pleasant and conversant.    Laboratory and Imaging Data: Dg Chest 2  View  07/27/2015  CLINICAL DATA:  Cough, history of COPD, allergic rhinitis, former smoker. EXAM: CHEST  2 VIEW COMPARISON:  PA and lateral chest x-ray of May 05, 2009 FINDINGS: The lungs are adequately inflated. The interstitial markings are increased at the left lung base. There is no pleural effusion. The heart is normal in size. The central pulmonary vascularity is prominent on the right but stable. The mediastinum is normal in width. The bony thorax exhibits no acute abnormality. IMPRESSION: Left basilar atelectasis or early pneumonia superimposed upon COPD. Followup PA and lateral chest X-ray is recommended in 3-4 weeks following trial of antibiotic therapy to ensure resolution and exclude underlying malignancy. Electronically Signed   By:  David  Martinique M.D.   On: 07/27/2015 10:49     Assessment and Plan:   COPD exacerbation ([place])  Cough - Plan: DG Chest 2 View  CAP (community acquired pneumonia)  COPD + CAP LVQ with steroids  I strongly encouraged him to become more compliant with his baseline COPD medications, and he indicated to me that he would.  Follow-up: No Follow-up on file.  New Prescriptions   LEVOFLOXACIN (LEVAQUIN) 500 MG TABLET    Take 1 tablet (500 mg total) by mouth daily.   PREDNISONE (DELTASONE) 20 MG TABLET    2 tabs po daily for 5 days, then 1 tab po daily for 5 days   Modified Medications   No medications on file   Orders Placed This Encounter  Procedures  . DG Chest 2 View    Signed,  Frederico Hamman T. Sharlee Rufino, MD   Patient's Medications  New Prescriptions   LEVOFLOXACIN (LEVAQUIN) 500 MG TABLET    Take 1 tablet (500 mg total) by mouth daily.   PREDNISONE (DELTASONE) 20 MG TABLET    2 tabs po daily for 5 days, then 1 tab po daily for 5 days  Previous Medications   ALBUTEROL (PROVENTIL HFA;VENTOLIN HFA) 108 (90 BASE) MCG/ACT INHALER    Inhale 2 puffs into the lungs every 6 (six) hours as needed for wheezing or shortness of breath.   CHLORHEXIDINE (PERIDEX) 0.12 % SOLUTION       DULERA 200-5 MCG/ACT AERO    INHALE TWO PUFFS TWICE DAILY   FLUOXETINE (PROZAC) 20 MG CAPSULE    TAKE ONE CAPSULE BY MOUTH ONCE DAILY   MULTIPLE VITAMIN (MULTIVITAMIN) TABLET    Take 1 tablet by mouth daily.   OMEPRAZOLE (PRILOSEC) 20 MG CAPSULE    Take 20 mg by mouth daily.     SPIRIVA HANDIHALER 18 MCG INHALATION CAPSULE    INHALE ONE DOSE BY MOUTH ONCE DAILY  Modified Medications   No medications on file  Discontinued Medications   DOXYCYCLINE (VIBRA-TABS) 100 MG TABLET    Take 1 tablet (100 mg total) by mouth 2 (two) times daily.   PREDNISONE (DELTASONE) 20 MG TABLET    3 tabs by mouth daily x 3 days, then 2 tabs by mouth daily x 2 days then 1 tab by mouth daily x 2 days

## 2015-07-27 NOTE — Progress Notes (Signed)
Pre visit review using our clinic review tool, if applicable. No additional management support is needed unless otherwise documented below in the visit note. 

## 2015-07-28 ENCOUNTER — Other Ambulatory Visit: Payer: Self-pay | Admitting: Family Medicine

## 2015-07-28 ENCOUNTER — Encounter: Payer: Self-pay | Admitting: Family Medicine

## 2015-07-28 DIAGNOSIS — J189 Pneumonia, unspecified organism: Secondary | ICD-10-CM

## 2015-08-04 ENCOUNTER — Other Ambulatory Visit: Payer: Self-pay | Admitting: Family Medicine

## 2015-08-23 ENCOUNTER — Other Ambulatory Visit: Payer: Self-pay | Admitting: Family Medicine

## 2015-08-30 ENCOUNTER — Ambulatory Visit (INDEPENDENT_AMBULATORY_CARE_PROVIDER_SITE_OTHER)
Admission: RE | Admit: 2015-08-30 | Discharge: 2015-08-30 | Disposition: A | Discharge status: Home / Self Care | Payer: BLUE CROSS/BLUE SHIELD | Source: Ambulatory Visit | Attending: Family Medicine | Admitting: Family Medicine

## 2015-08-30 DIAGNOSIS — J189 Pneumonia, unspecified organism: Secondary | ICD-10-CM | POA: Diagnosis not present

## 2015-12-15 ENCOUNTER — Encounter: Payer: Self-pay | Admitting: Family Medicine

## 2016-01-23 ENCOUNTER — Other Ambulatory Visit: Payer: BLUE CROSS/BLUE SHIELD

## 2016-01-23 ENCOUNTER — Other Ambulatory Visit: Payer: Self-pay | Admitting: Family Medicine

## 2016-01-30 ENCOUNTER — Encounter: Payer: BLUE CROSS/BLUE SHIELD | Admitting: Family Medicine

## 2016-02-28 ENCOUNTER — Other Ambulatory Visit: Payer: Self-pay | Admitting: Family Medicine

## 2016-03-16 ENCOUNTER — Other Ambulatory Visit: Payer: Self-pay | Admitting: Family Medicine

## 2016-04-16 ENCOUNTER — Other Ambulatory Visit: Payer: Self-pay | Admitting: Family Medicine

## 2016-04-16 DIAGNOSIS — Z114 Encounter for screening for human immunodeficiency virus [HIV]: Secondary | ICD-10-CM

## 2016-04-16 DIAGNOSIS — Z79899 Other long term (current) drug therapy: Secondary | ICD-10-CM

## 2016-04-16 DIAGNOSIS — Z1159 Encounter for screening for other viral diseases: Secondary | ICD-10-CM

## 2016-04-16 DIAGNOSIS — E7849 Other hyperlipidemia: Secondary | ICD-10-CM

## 2016-04-16 DIAGNOSIS — Z125 Encounter for screening for malignant neoplasm of prostate: Secondary | ICD-10-CM

## 2016-04-20 ENCOUNTER — Other Ambulatory Visit (INDEPENDENT_AMBULATORY_CARE_PROVIDER_SITE_OTHER): Payer: BLUE CROSS/BLUE SHIELD

## 2016-04-20 DIAGNOSIS — E784 Other hyperlipidemia: Secondary | ICD-10-CM | POA: Diagnosis not present

## 2016-04-20 DIAGNOSIS — Z114 Encounter for screening for human immunodeficiency virus [HIV]: Secondary | ICD-10-CM

## 2016-04-20 DIAGNOSIS — Z125 Encounter for screening for malignant neoplasm of prostate: Secondary | ICD-10-CM

## 2016-04-20 DIAGNOSIS — E7849 Other hyperlipidemia: Secondary | ICD-10-CM

## 2016-04-20 DIAGNOSIS — Z79899 Other long term (current) drug therapy: Secondary | ICD-10-CM

## 2016-04-20 DIAGNOSIS — Z1159 Encounter for screening for other viral diseases: Secondary | ICD-10-CM

## 2016-04-20 LAB — HEPATIC FUNCTION PANEL
ALT: 22 U/L (ref 0–53)
AST: 17 U/L (ref 0–37)
Albumin: 4.2 g/dL (ref 3.5–5.2)
Alkaline Phosphatase: 127 U/L — ABNORMAL HIGH (ref 39–117)
BILIRUBIN TOTAL: 0.6 mg/dL (ref 0.2–1.2)
Bilirubin, Direct: 0.1 mg/dL (ref 0.0–0.3)
Total Protein: 6.7 g/dL (ref 6.0–8.3)

## 2016-04-20 LAB — LIPID PANEL
CHOL/HDL RATIO: 4
Cholesterol: 173 mg/dL (ref 0–200)
HDL: 46.9 mg/dL (ref >39.00)
LDL CALC: 104 mg/dL — AB (ref 0–99)
NONHDL: 126.42
Triglycerides: 113 mg/dL (ref 0.0–149.0)
VLDL: 22.6 mg/dL (ref 0.0–40.0)

## 2016-04-20 LAB — BASIC METABOLIC PANEL
BUN: 8 mg/dL (ref 6–23)
CHLORIDE: 103 meq/L (ref 96–112)
CO2: 31 meq/L (ref 19–32)
Calcium: 9.8 mg/dL (ref 8.4–10.5)
Creatinine, Ser: 0.85 mg/dL (ref 0.40–1.50)
GFR: 96.57 mL/min (ref >60.00)
GLUCOSE: 101 mg/dL — AB (ref 70–99)
POTASSIUM: 4.9 meq/L (ref 3.5–5.1)
SODIUM: 140 meq/L (ref 135–145)

## 2016-04-20 LAB — CBC WITH DIFFERENTIAL/PLATELET
BASOS PCT: 0.7 % (ref 0.0–3.0)
Basophils Absolute: 0.1 10*3/uL (ref 0.0–0.1)
EOS PCT: 6.6 % — AB (ref 0.0–5.0)
Eosinophils Absolute: 0.5 10*3/uL (ref 0.0–0.7)
HCT: 47.8 % (ref 39.0–52.0)
Hemoglobin: 15.8 g/dL (ref 13.0–17.0)
LYMPHS ABS: 1.4 10*3/uL (ref 0.7–4.0)
Lymphocytes Relative: 19.8 % (ref 12.0–46.0)
MCHC: 33 g/dL (ref 30.0–36.0)
MCV: 82.5 fl (ref 78.0–100.0)
MONOS PCT: 9.8 % (ref 3.0–12.0)
Monocytes Absolute: 0.7 10*3/uL (ref 0.1–1.0)
NEUTROS ABS: 4.3 10*3/uL (ref 1.4–7.7)
NEUTROS PCT: 63.1 % (ref 43.0–77.0)
Platelets: 281 10*3/uL (ref 150.0–400.0)
RBC: 5.79 Mil/uL (ref 4.22–5.81)
RDW: 15.5 % (ref 11.5–15.5)
WBC: 6.9 10*3/uL (ref 4.0–10.5)

## 2016-04-20 LAB — PSA: PSA: 0.47 ng/mL (ref 0.10–4.00)

## 2016-04-21 LAB — HEPATITIS C ANTIBODY: HCV AB: NEGATIVE

## 2016-04-21 LAB — HIV ANTIBODY (ROUTINE TESTING W REFLEX): HIV 1&2 Ab, 4th Generation: NONREACTIVE

## 2016-04-23 ENCOUNTER — Encounter: Payer: Self-pay | Admitting: Family Medicine

## 2016-04-23 ENCOUNTER — Ambulatory Visit (INDEPENDENT_AMBULATORY_CARE_PROVIDER_SITE_OTHER): Payer: BLUE CROSS/BLUE SHIELD | Admitting: Family Medicine

## 2016-04-23 VITALS — BP 110/80 | HR 76 | Temp 98.6°F | Ht 66.5 in | Wt 204.2 lb

## 2016-04-23 DIAGNOSIS — Z23 Encounter for immunization: Secondary | ICD-10-CM

## 2016-04-23 DIAGNOSIS — Z0000 Encounter for general adult medical examination without abnormal findings: Secondary | ICD-10-CM | POA: Diagnosis not present

## 2016-04-23 NOTE — Progress Notes (Signed)
Dr. Frederico Hamman T. Skylen Spiering, MD, [facility] [place], [address] Phone: [phone] Fax: [phone]  04/23/2016  Patient: Richard Grimes, MRN: [id number], DOB: 21-Mar-1951, 65 y.o.  Primary Physician:  [name], MD   Chief Complaint  Patient presents with  . Annual Exam   Subjective:   MAHIR PRABHAKAR is a 65 y.o. pleasant patient who presents with the following:  Preventative Health Maintenance Visit:  Health Maintenance Summary Reviewed and updated, unless pt declines services.  Tobacco History Reviewed. Alcohol: No concerns, no excessive use Exercise Habits: Some activity, rec at least 30 mins 5 times a week STD concerns: no risk or activity to increase risk Drug Use: None Encouraged self-testicular check  Breathing going ok  cologuard  Lost about 7-8 pounds  Health Maintenance  Topic Date Due  . COLONOSCOPY  09/12/2002  . COLON CANCER SCREENING ANNUAL FOBT  02/03/2016  . INFLUENZA VACCINE  09/29/2016 (Originally 01/31/2016)  . PNEUMOCOCCAL POLYSACCHARIDE VACCINE (2) 04/23/2017 (Originally 01/04/2016)  . TETANUS/TDAP  04/23/2026  . ZOSTAVAX  Completed  . Hepatitis C Screening  Completed  . HIV Screening  Completed   Immunization History  Administered Date(s) Administered  . Influenza Whole 05/24/2009  . Pneumococcal Conjugate-13 01/20/2014  . Pneumococcal Polysaccharide-23 01/04/2011  . Tdap 04/23/2016  . Zoster 01/20/2014   Patient Active Problem List   Diagnosis Date Noted  . COPD exacerbation ([place]) 06/14/2015  . Allergic rhinitis due to pollen 07/18/2011  . DEPRESSION 05/24/2009  . COPD 05/24/2009  . GERD 05/24/2009   Past Medical History:  Diagnosis Date  . Allergic rhinitis due to pollen 07/18/2011  . COPD (chronic obstructive pulmonary disease) (HCC)    Severe, based on Spirometry.   . Depression   . GERD (gastroesophageal reflux disease)    Past Surgical History:  Procedure  Laterality Date  . CARDIAC CATHETERIZATION  2000   Normal coronaries  . NASAL POLYP SURGERY     Social History   Social History  . Marital status: Divorced    Spouse name: [name]  . Number of children: N/A  . 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 Smoking status: Former Smoker    Quit date: 07/02/2004  . Smokeless tobacco: Never Used  . Alcohol use 0.0 oz/week     Comment: occ glass on wine  . Drug use: No  . Sexual activity: Yes    Partners: Female   Other Topics Concern  . Not on file   Social History Narrative  . No narrative on file   No family history on file. Allergies  Allergen Reactions  . Penicillins Rash    Medication list has been reviewed and updated.   General: Denies fever, chills, sweats. No significant weight loss. Eyes: Denies blurring,significant itching ENT: Denies earache, sore throat, and hoarseness. Cardiovascular: Denies chest pains, palpitations, dyspnea on exertion Respiratory: Denies cough, dyspnea at rest,wheeezing Breast: no concerns about lumps GI: Denies nausea, vomiting, diarrhea, constipation, change in bowel habits, abdominal pain, melena, hematochezia GU: Denies penile discharge, ED, urinary flow / outflow problems. No STD concerns. Musculoskeletal: Denies back pain, joint pain Derm: Denies rash, itching Neuro: Denies  paresthesias, frequent falls, frequent headaches Psych: Denies depression, anxiety Endocrine: Denies cold intolerance, heat intolerance, polydipsia Heme: Denies enlarged lymph nodes Allergy: No hayfever  Objective:   BP 110/80   Pulse 76   Temp 98.6  F (37 C) (Oral)   Ht 5' 6.5" (1.689 m)   Wt 204 lb 4 oz (92.6 kg)   BMI 32.47 kg/m  Ideal Body Weight: Weight in (lb) to have BMI = 25: 156.9  No exam data present  GEN: well developed, well nourished, no acute distress Eyes: conjunctiva and lids normal, PERRLA, EOMI ENT: TM  clear, nares clear, oral exam WNL Neck: supple, no lymphadenopathy, no thyromegaly, no JVD Pulm: clear to auscultation and percussion, respiratory effort normal CV: regular rate and rhythm, S1-S2, no murmur, rub or gallop, no bruits, peripheral pulses normal and symmetric, no cyanosis, clubbing, edema or varicosities GI: soft, non-tender; no hepatosplenomegaly, masses; active bowel sounds all quadrants GU: no hernia, testicular mass, penile discharge Lymph: no cervical, axillary or inguinal adenopathy MSK: gait normal, muscle tone and strength WNL, no joint swelling, effusions, discoloration, crepitus  SKIN: clear, good turgor, color WNL, no rashes, lesions, or ulcerations Neuro: normal mental status, normal strength, sensation, and motion Psych: alert; oriented to person, place and time, normally interactive and not anxious or depressed in appearance. All labs reviewed with patient.  Lipids:    Component Value Date/Time   CHOL 173 04/20/2016 1109   TRIG 113.0 04/20/2016 1109   HDL 46.90 04/20/2016 1109   LDLDIRECT 85.4 01/04/2011 1054   VLDL 22.6 04/20/2016 1109   CHOLHDL 4 04/20/2016 1109   CBC: CBC Latest Ref Rng & Units 04/20/2016 01/17/2015 01/04/2014  WBC 4.0 - 10.5 K/uL 6.9 5.2 6.5  Hemoglobin 13.0 - 17.0 g/dL 15.8 15.1 15.2  Hematocrit 39.0 - 52.0 % 47.8 45.5 45.6  Platelets 150.0 - 400.0 K/uL 281.0 234.0 409.8    Basic Metabolic Panel:    Component Value Date/Time   NA 140 04/20/2016 1109   K 4.9 04/20/2016 1109   CL 103 04/20/2016 1109   CO2 31 04/20/2016 1109   BUN 8 04/20/2016 1109   CREATININE 0.85 04/20/2016 1109   GLUCOSE 101 (H) 04/20/2016 1109   CALCIUM 9.8 04/20/2016 1109   Hepatic Function Latest Ref Rng & Units 04/20/2016 01/17/2015 01/04/2014  Total Protein 6.0 - 8.3 g/dL 6.7 6.2 6.6  Albumin 3.5 - 5.2 g/dL 4.2 3.9 4.0  AST 0 - 37 U/L _0 ALT 0 - 53 U/L _1 Alk Phosphatase 39 - 117 U/L 127(H) 105 101  Total Bilirubin 0.2 - 1.2 mg/dL 0.6 0.4  0.6  Bilirubin, Direct 0.0 - 0.3 mg/dL 0.1 0.1 0.1    Lab Results  Component Value Date   TSH 1.70 01/01/2012   Lab Results  Component Value Date   PSA 0.47 04/20/2016   PSA 0.58 01/17/2015   PSA 0.40 01/04/2014    Assessment and Plan:   Healthcare maintenance  Need for prophylactic vaccination with combined diphtheria-tetanus-pertussis (DTP) vaccine - Plan: Tdap vaccine greater than or equal to 7yo IM   Will check on cologuard Work on [employer]  Health Maintenance Exam: The patient's preventative maintenance and recommended screening tests for an annual wellness exam were reviewed in full today. Brought up to date unless services declined.  Counselled on the importance of diet, exercise, and its role in overall health and mortality. The patient's FH and SH was reviewed, including their home life, tobacco status, and drug and alcohol status.  Follow-up: No Follow-up on file. Unless noted, follow-up in 1 year for Health Maintenance Exam.  Orders Placed This Encounter  Procedures  . Tdap vaccine greater than or equal to 7yo IM  Signed,  Maud Deed. Romen Yutzy, MD   Patient's Medications  New Prescriptions   No medications on file  Previous Medications   ALBUTEROL (PROVENTIL HFA;VENTOLIN HFA) 108 (90 BASE) MCG/ACT INHALER    Inhale 2 puffs into the lungs every 6 (six) hours as needed for wheezing or shortness of breath.   DULERA 200-5 MCG/ACT AERO    INHALE TWO PUFFS BY MOUTH TWICE DAILY   FLUOXETINE (PROZAC) 20 MG CAPSULE    TAKE ONE CAPSULE BY MOUTH ONCE DAILY   MULTIPLE VITAMIN (MULTIVITAMIN) TABLET    Take 1 tablet by mouth daily.   OMEPRAZOLE (PRILOSEC) 20 MG CAPSULE    Take 20 mg by mouth daily.     SPIRIVA HANDIHALER 18 MCG INHALATION CAPSULE    INHALE ONE DOSE BY MOUTH ONCE DAILY  Modified Medications   No medications on file  Discontinued Medications   CHLORHEXIDINE (PERIDEX) 0.12 % SOLUTION       LEVOFLOXACIN (LEVAQUIN) 500 MG TABLET    Take 1 tablet (500 mg  total) by mouth daily.   PREDNISONE (DELTASONE) 20 MG TABLET    2 tabs po daily for 5 days, then 1 tab po daily for 5 days

## 2016-04-23 NOTE — Progress Notes (Signed)
Pre visit review using our clinic review tool, if applicable. No additional management support is needed unless otherwise documented below in the visit note. 

## 2016-04-24 ENCOUNTER — Encounter: Payer: Self-pay | Admitting: Family Medicine

## 2016-05-14 LAB — COLOGUARD: Cologuard: NEGATIVE

## 2016-05-22 ENCOUNTER — Encounter: Payer: Self-pay | Admitting: Family Medicine

## 2016-06-19 ENCOUNTER — Encounter: Payer: Self-pay | Admitting: Family Medicine

## 2016-06-19 NOTE — Telephone Encounter (Signed)
Letter written and placed in Dr. Lorelei Pont in box for signature.

## 2016-06-19 NOTE — Telephone Encounter (Signed)
I wrote a 07/11/2015 letter for Richard Grimes. Can you use same verbage with date changes and I will sign it. Known well and I still agree with my statement.

## 2016-06-19 NOTE — Telephone Encounter (Signed)
See my note below yours.

## 2016-06-20 ENCOUNTER — Encounter: Payer: Self-pay | Admitting: Family Medicine

## 2016-06-28 ENCOUNTER — Other Ambulatory Visit: Payer: Self-pay | Admitting: Family Medicine

## 2016-07-19 ENCOUNTER — Other Ambulatory Visit: Payer: Self-pay | Admitting: Family Medicine

## 2016-07-30 ENCOUNTER — Other Ambulatory Visit: Payer: Self-pay | Admitting: Family Medicine

## 2016-09-04 ENCOUNTER — Encounter: Payer: Self-pay | Admitting: Family Medicine

## 2016-09-04 NOTE — Telephone Encounter (Signed)
Pt left vm with triage and wanted to confirm Dr Lorelei Pont received his message. He wants to confirm his s/s are not heart related as there are no openings tomorrow, and he may need to be evaluated. pls advise

## 2016-09-10 ENCOUNTER — Encounter: Payer: Self-pay | Admitting: Family Medicine

## 2016-09-10 ENCOUNTER — Other Ambulatory Visit: Payer: Self-pay | Admitting: Family Medicine

## 2016-09-10 ENCOUNTER — Ambulatory Visit (INDEPENDENT_AMBULATORY_CARE_PROVIDER_SITE_OTHER): Payer: BLUE CROSS/BLUE SHIELD | Admitting: Family Medicine

## 2016-09-10 VITALS — BP 120/78 | HR 95 | Temp 98.4°F | Ht 66.5 in | Wt 212.5 lb

## 2016-09-10 DIAGNOSIS — R5383 Other fatigue: Secondary | ICD-10-CM

## 2016-09-10 DIAGNOSIS — R05 Cough: Secondary | ICD-10-CM

## 2016-09-10 DIAGNOSIS — R12 Heartburn: Secondary | ICD-10-CM | POA: Diagnosis not present

## 2016-09-10 DIAGNOSIS — R61 Generalized hyperhidrosis: Secondary | ICD-10-CM | POA: Diagnosis not present

## 2016-09-10 DIAGNOSIS — R059 Cough, unspecified: Secondary | ICD-10-CM

## 2016-09-10 LAB — BASIC METABOLIC PANEL
BUN: 11 mg/dL (ref 7–25)
CALCIUM: 8.7 mg/dL (ref 8.6–10.3)
CHLORIDE: 105 mmol/L (ref 98–110)
CO2: 22 mmol/L (ref 20–31)
Creat: 0.75 mg/dL (ref 0.70–1.25)
Glucose, Bld: 102 mg/dL — ABNORMAL HIGH (ref 65–99)
Potassium: 4.4 mmol/L (ref 3.5–5.3)
SODIUM: 141 mmol/L (ref 135–146)

## 2016-09-10 LAB — CBC WITH DIFFERENTIAL/PLATELET
BASOS PCT: 0 %
Basophils Absolute: 0 cells/uL (ref 0–200)
Eosinophils Absolute: 237 cells/uL (ref 15–500)
Eosinophils Relative: 3 %
HCT: 38.4 % — ABNORMAL LOW (ref 38.5–50.0)
Hemoglobin: 12.5 g/dL — ABNORMAL LOW (ref 13.2–17.1)
Lymphocytes Relative: 16 %
Lymphs Abs: 1264 cells/uL (ref 850–3900)
MCH: 26.6 pg — ABNORMAL LOW (ref 27.0–33.0)
MCHC: 32.6 g/dL (ref 32.0–36.0)
MCV: 81.7 fL (ref 80.0–100.0)
MONOS PCT: 13 %
MPV: 8.3 fL (ref 7.5–12.5)
Monocytes Absolute: 1027 cells/uL — ABNORMAL HIGH (ref 200–950)
NEUTROS ABS: 5372 {cells}/uL (ref 1500–7800)
Neutrophils Relative %: 68 %
PLATELETS: 378 10*3/uL (ref 140–400)
RBC: 4.7 MIL/uL (ref 4.20–5.80)
RDW: 13.8 % (ref 11.0–15.0)
WBC: 7.9 10*3/uL (ref 3.8–10.8)

## 2016-09-10 LAB — HEPATIC FUNCTION PANEL
ALBUMIN: 3.4 g/dL — AB (ref 3.6–5.1)
ALT: 14 U/L (ref 9–46)
AST: 12 U/L (ref 10–35)
Alkaline Phosphatase: 93 U/L (ref 40–115)
BILIRUBIN TOTAL: 0.5 mg/dL (ref 0.2–1.2)
Bilirubin, Direct: 0.5 mg/dL — ABNORMAL HIGH (ref <=0.2)
Indirect Bilirubin: 0 mg/dL — ABNORMAL LOW (ref 0.2–1.2)
Total Protein: 6.5 g/dL (ref 6.1–8.1)

## 2016-09-10 LAB — TSH: TSH: 1.02 mIU/L (ref 0.40–4.50)

## 2016-09-10 LAB — VITAMIN B12: VITAMIN B 12: 1104 pg/mL — AB (ref 200–1100)

## 2016-09-10 NOTE — Progress Notes (Signed)
Pre visit review using our clinic review tool, if applicable. No additional management support is needed unless otherwise documented below in the visit note. 

## 2016-09-10 NOTE — Progress Notes (Signed)
Dr. Frederico Hamman T. Archita Lomeli, MD, [facility] [place], [address] Phone: [phone] Fax: [phone]  09/10/2016  Patient: Richard Grimes, MRN: [id number], DOB: June 07, 1951, 66 y.o.  Primary Physician:  [name], MD   Chief Complaint  Patient presents with  . Night Sweats  . Neck Pain  . Burning in Chest  . Headache  . Gas  . Cough   Subjective:   Richard Grimes is a 66 y.o. very pleasant male patient who presents with the following:  The patient presents with a number of complaints as listed above.  Had been sweating - got up and wet and sweating.  BP and 02 are normal.   Stopped caffeine in the last couple of weeks. Cut nicotine gum in 1/2 This is with a history of drinking about a pot of coffee in the morning, and eating and equivalent to about 50 mg of nicotine worth of nicotine gum a day.  Had some reflux issues in February.  Prilosec has increased to bid.   BM now is normal color.   No drugs, tobacco.  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   He has been sweating a lot, and this all started in the last couple of weeks.  Past Medical History, Surgical History, Social History, Family History, Problem List, Medications, and Allergies have been reviewed and updated if relevant.  Patient Active Problem List   Diagnosis Date Noted  . COPD exacerbation ([place]) 06/14/2015  . Allergic rhinitis due to pollen 07/18/2011  . DEPRESSION 05/24/2009  . COPD 05/24/2009  . GERD 05/24/2009    Past Medical History:  Diagnosis Date  . Allergic rhinitis due to pollen 07/18/2011  . COPD (chronic obstructive pulmonary disease) (HCC)    Severe, based on Spirometry.   . Depression   . GERD (gastroesophageal reflux disease)     Past Surgical History:  Procedure Laterality Date  . CARDIAC CATHETERIZATION  2000   Normal coronaries  . NASAL POLYP SURGERY      Social History   Social History  .  Marital status: Divorced    Spouse name: [name]  . Number of children: N/A  . 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 Smoking status: Former Smoker    Quit date: 07/02/2004  . Smokeless tobacco: Never Used  . Alcohol use 0.0 oz/week     Comment: occ glass on wine  . Drug use: No  . Sexual activity: Yes    Partners: Female   Other Topics Concern  . Not on file   Social History Narrative  . No narrative on file    No family history on file.  Allergies  Allergen Reactions  . Penicillins Rash    Medication list reviewed and updated in full in [place].   GEN: No acute illnesses, no fevers, chills. GI: No n/v/d, eating normally Pulm: No SOB Interactive and getting along well at home.  Otherwise, ROS is as per the HPI.  Objective:   BP 120/78   Pulse 95   Temp 98.4 F (36.9 C) (Oral)   Ht 5' 6.5" (1.689 m)   Wt 212 lb 8 oz (96.4 kg)   SpO2 98%   BMI 33.78 kg/m   GEN: WDWN, NAD, Non-toxic, A & O x 3 HEENT: Atraumatic, Normocephalic. Neck supple. No masses, No LAD. Ears and  Nose: No external deformity. CV: RRR, No M/G/R. No JVD. No thrill. No extra heart sounds. PULM: CTA B, no wheezes, crackles, rhonchi. No retractions. No resp. distress. No accessory muscle use. EXTR: No c/c/e NEURO Normal gait.  PSYCH: Normally interactive. Conversant. Not depressed or anxious appearing.  Calm demeanor.   Laboratory and Imaging Data: Results for orders placed or performed in visit on 09/10/16  Hepatic function panel  Result Value Ref Range   Total Bilirubin 0.5 0.2 - 1.2 mg/dL   Bilirubin, Direct 0.5 (H) <=0.2 mg/dL   Indirect Bilirubin 0.0 (L) 0.2 - 1.2 mg/dL   Alkaline Phosphatase 93 40 - 115 U/L   AST 12 10 - 35 U/L   ALT 14 9 - 46 U/L   Total Protein 6.5 6.1 - 8.1 g/dL   Albumin 3.4 (L) 3.6 - 5.1 g/dL  Basic metabolic panel  Result Value Ref Range   Sodium 141 135 - 146 mmol/L    Potassium 4.4 3.5 - 5.3 mmol/L   Chloride 105 98 - 110 mmol/L   CO2 22 20 - 31 mmol/L   Glucose, Bld 102 (H) 65 - 99 mg/dL   BUN 11 7 - 25 mg/dL   Creat 0.75 0.70 - 1.25 mg/dL   Calcium 8.7 8.6 - 10.3 mg/dL  TSH  Result Value Ref Range   TSH 1.02 0.40 - 4.50 mIU/L  CBC with Differential/Platelet  Result Value Ref Range   WBC 7.9 3.8 - 10.8 K/uL   RBC 4.70 4.20 - 5.80 MIL/uL   Hemoglobin 12.5 (L) 13.2 - 17.1 g/dL   HCT 38.4 (L) 38.5 - 50.0 %   MCV 81.7 80.0 - 100.0 fL   MCH 26.6 (L) 27.0 - 33.0 pg   MCHC 32.6 32.0 - 36.0 g/dL   RDW 13.8 11.0 - 15.0 %   Platelets 378 140 - 400 K/uL   MPV 8.3 7.5 - 12.5 fL   Neutro Abs 5,372 1,500 - 7,800 cells/uL   Lymphs Abs 1,264 850 - 3,900 cells/uL   Monocytes Absolute 1,027 (H) 200 - 950 cells/uL   Eosinophils Absolute 237 15 - 500 cells/uL   Basophils Absolute 0 0 - 200 cells/uL   Neutrophils Relative % 68 %   Lymphocytes Relative 16 %   Monocytes Relative 13 %   Eosinophils Relative 3 %   Basophils Relative 0 %   Smear Review Criteria for review not met   Vitamin B12  Result Value Ref Range   Vitamin B-12 1,104 (H) 200 - 1,100 pg/mL     Assessment and Plan:   Sweating profusely - Plan: Testos,Total,Free and SHBG (Male), Hepatic function panel, Basic metabolic panel, TSH, CBC with Differential/Platelet, Vitamin B12, CANCELED: Basic metabolic panel, CANCELED: CBC with Differential/Platelet, CANCELED: Hepatic function panel, CANCELED: Vitamin B12, CANCELED: TSH  Other fatigue - Plan: Testos,Total,Free and SHBG (Male), Hepatic function panel, Basic metabolic panel, TSH, CBC with Differential/Platelet, Vitamin B12, CANCELED: Basic metabolic panel, CANCELED: CBC with Differential/Platelet, CANCELED: Hepatic function panel, CANCELED: Vitamin B12, CANCELED: TSH  Heartburn - Plan: Hepatic function panel, Basic metabolic panel, TSH, CBC with Differential/Platelet, Vitamin B12  Cough  Workup possible organic causes.  I'm suspicious  that this correlates with his stopping caffeine and cutting his nicotine intake by about 30 mg all within the last 2 weeks.  We will follow-up after all studies have returned.  Follow-up: No Follow-up on file.  Orders Placed This Encounter  Procedures  . Testos,Total,Free and SHBG (Male)  . Hepatic function panel  .  Basic metabolic panel  . TSH  . CBC with Differential/Platelet  . Vitamin B12    Signed,  Kahlil Cowans T. Phillip Sandler, MD   Allergies as of 09/10/2016      Reactions   Penicillins Rash      Medication List       Accurate as of 09/10/16 11:59 PM. Always use your most recent med list.          DULERA 200-5 MCG/ACT Aero Generic drug:  mometasone-formoterol INHALE TWO PUFFS BY MOUTH TWICE DAILY   FLUoxetine 20 MG capsule Commonly known as:  PROZAC TAKE ONE CAPSULE BY MOUTH ONCE DAILY   multivitamin tablet Take 1 tablet by mouth daily.   omeprazole 20 MG capsule Commonly known as:  PRILOSEC Take 20 mg by mouth daily.   PROVENTIL HFA 108 (90 Base) MCG/ACT inhaler Generic drug:  albuterol INHALE TWO PUFFS BY MOUTH EVERY 6 HOURS AS NEEDED FOR WHEEZING OR  SHORTNESS  OF  BREATH   SPIRIVA HANDIHALER 18 MCG inhalation capsule Generic drug:  tiotropium INHALE ONE DOSE BY MOUTH ONCE DAILY

## 2016-09-12 LAB — IRON,TIBC AND FERRITIN PANEL
%SAT: 10 % — AB (ref 15–60)
Ferritin: 302 ng/mL (ref 20–380)
Iron: 25 ug/dL — ABNORMAL LOW (ref 50–180)
TIBC: 249 ug/dL — ABNORMAL LOW (ref 250–425)

## 2016-09-13 ENCOUNTER — Encounter: Payer: Self-pay | Admitting: Family Medicine

## 2016-09-13 LAB — TESTOS,TOTAL,FREE AND SHBG (FEMALE)
Sex Hormone Binding Glob.: 20 nmol/L — ABNORMAL LOW (ref 22–77)
Testosterone, Free: 35.3 pg/mL (ref 35.0–155.0)
Testosterone,Total,LC/MS/MS: 237 ng/dL — ABNORMAL LOW (ref 250–1100)

## 2016-09-18 ENCOUNTER — Other Ambulatory Visit: Payer: Self-pay | Admitting: Family Medicine

## 2016-09-18 DIAGNOSIS — K219 Gastro-esophageal reflux disease without esophagitis: Secondary | ICD-10-CM

## 2016-09-18 DIAGNOSIS — R1010 Upper abdominal pain, unspecified: Secondary | ICD-10-CM

## 2016-09-18 DIAGNOSIS — D6489 Other specified anemias: Secondary | ICD-10-CM

## 2016-09-19 NOTE — Telephone Encounter (Signed)
I did not best answer all of his questions including those regarding anemia, his GI questions, as well as going over testosterone and free testosterone levels to the best of my ability.  We're going to start iron sulfate 325 milligrams a day.  Follow-up with GI.  After consultation with gastroenterology, and no significant findings are found, further workup will be indicated.

## 2016-09-20 ENCOUNTER — Encounter: Payer: Self-pay | Admitting: Physician Assistant

## 2016-09-26 ENCOUNTER — Encounter: Payer: Self-pay | Admitting: Physician Assistant

## 2016-09-26 ENCOUNTER — Ambulatory Visit (INDEPENDENT_AMBULATORY_CARE_PROVIDER_SITE_OTHER): Payer: BLUE CROSS/BLUE SHIELD | Admitting: Physician Assistant

## 2016-09-26 VITALS — BP 128/90 | HR 95 | Ht 66.5 in | Wt 205.0 lb

## 2016-09-26 DIAGNOSIS — K219 Gastro-esophageal reflux disease without esophagitis: Secondary | ICD-10-CM | POA: Diagnosis not present

## 2016-09-26 DIAGNOSIS — D508 Other iron deficiency anemias: Secondary | ICD-10-CM

## 2016-09-26 MED ORDER — OMEPRAZOLE 20 MG PO CPDR: 20.0000 mg | DELAYED_RELEASE_CAPSULE | Freq: Two times a day (BID) | ORAL | 6 refills | Status: DC

## 2016-09-26 MED ORDER — NA SULFATE-K SULFATE-MG SULF 17.5-3.13-1.6 GM/177ML PO SOLN: 1.0000 | Freq: Once | ORAL | 0 refills | Status: AC

## 2016-09-26 NOTE — Progress Notes (Signed)
Thank you for sending this case to me. I have reviewed the entire note, and the outlined plan seems appropriate.   Tobey Lippard Danis, MD  

## 2016-09-26 NOTE — Patient Instructions (Signed)
Your physician has requested that you go to the basement for the following lab work before leaving today:Ifob test.   We have sent the following medications to your pharmacy for you to pick up at your convenience: omeprazole 20 mg twice daily.   You have been scheduled for an endoscopy and colonoscopy. Please follow the written instructions given to you at your visit today. Please pick up your prep supplies at the pharmacy within the next 1-3 days. If you use inhalers (even only as needed), please bring them with you on the day of your procedure. Your physician has requested that you go to www.[email or web address].[email or web address] and enter the access code given to you at your visit today. This web site gives a general overview about your procedure. However, you should still follow specific instructions given to you by our office regarding your preparation for the procedure.

## 2016-09-26 NOTE — Progress Notes (Signed)
Subjective:    Patient ID: Richard Grimes, male    DOB: 1951-06-05, 66 y.o.   MRN: [id number]  HPI  Richard Grimes is a very nice 66 year old white male, new to GI today referred by Richard Grimes for evaluation of new iron deficiency anemia. He has not had any prior GI evaluation. He has history of GERD, COPD and depression. Patient says he started having problems in January with an episode of bronchitis. He self treated with Zithromax and doxycycline which she brought on line which she says he feels may have exacerbated his reflux symptoms recur he has been on omeprazole 20 mg by mouth every morning for several years. He says he started noticing more heartburn and indigestion. He has no complaints of dysphagia or odynophagia. Then he started noticing an increase in fatigue and some night sweats. He went to see Richard Grimes and labs revealed a hemoglobin of 12.5, hematocrit of 38.4 monocytes elevated at 1027 LFTs within normal limits. Iron of 25/TIBC 249 and iron saturation of 10. He has not had Hemoccult done. He did do a Cologuard which was negative in November 2017 as he  was reluctant to have screening colonoscopy. Says his reflux symptoms have settled down for the most part. His appetite is been fine his weight is stable. He has no complaints of abdominal discomfort change in his bowel habits melena or hematochezia. He is not taking any regular aspirin or NSAIDs. Family history pertinent for mother with polyps no family history of colon cancer that he is aware of.  Review of Systems Pertinent positive and negative review of systems were noted in the above HPI section.  All other review of systems was otherwise negative.  Outpatient Encounter Prescriptions as of 09/26/2016  Medication Sig  . DULERA 200-5 MCG/ACT AERO INHALE TWO PUFFS BY MOUTH TWICE DAILY  . ferrous sulfate 325 (65 FE) MG tablet Take 325 mg by mouth daily with breakfast.  . FLUoxetine (PROZAC) 20 MG capsule TAKE ONE CAPSULE BY  MOUTH ONCE DAILY  . omeprazole (PRILOSEC) 20 MG capsule Take 1 capsule (20 mg total) by mouth 2 (two) times daily before a meal.  . PROVENTIL HFA 108 (90 Base) MCG/ACT inhaler INHALE TWO PUFFS BY MOUTH EVERY 6 HOURS AS NEEDED FOR WHEEZING OR  SHORTNESS  OF  BREATH  . SPIRIVA HANDIHALER 18 MCG inhalation capsule INHALE ONE DOSE BY MOUTH ONCE DAILY  . [DISCONTINUED] Multiple Vitamin (MULTIVITAMIN) tablet Take 1 tablet by mouth daily.  . [DISCONTINUED] omeprazole (PRILOSEC) 20 MG capsule Take 20 mg by mouth daily.    . Na Sulfate-K Sulfate-Mg Sulf 17.5-3.13-1.6 GM/180ML SOLN Take 1 kit by mouth once.   No facility-administered encounter medications on file as of 09/26/2016.    Allergies  Allergen Reactions  . Penicillins Rash   Patient Active Problem List   Diagnosis Date Noted  . Allergic rhinitis due to pollen 07/18/2011  . DEPRESSION 05/24/2009  . COPD 05/24/2009  . GERD 05/24/2009   Social History   Social History  . Marital status: Divorced    Spouse name: [name]  . Number of children: N/A  . 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 Smoking status: Former Smoker    Quit date: 07/02/2004  . Smokeless tobacco: Never Used  . Alcohol use 0.0 oz/week     Comment: occ glass on wine  . Drug use: No  . Sexual activity: Yes  Partners: Female   Other Topics Concern  . Not on file   Social History Narrative  . No narrative on file    Richard Grimes's family history is not on file.      Objective:    Vitals:   09/26/16 1411  BP: 128/90  Pulse: 95    Physical Exam  well-developed older white male in no acute distress, accompanied by his wife both very pleasant blood pressure 128/80 pulse 95, height 5 foot 6, weight 205, BMI 32.5. HEENT; nontraumatic normocephalic EOMI PERRLA sclera anicteric, Cardiovascular; regular rate and rhythm with S1-S2 no murmur or gallop, Pulmonary; slightly decreased bowels  breath sounds bilaterally, Abdomen ;soft, nontender nondistended bowel sounds are active there is no palpable mass or hepatosplenomegaly, Rectal ;exam not done, Extremities ;no clubbing cyanosis or edema skin warm dry, Neuropsych ;mood and affect appropriate       Assessment & Plan:   #65 66 year old white male with new iron deficiency anemia, recently symptomatic with occasional night sweats and fatigue. Rule out occult lower versus upper GI neoplasm, AVM's #2 history of chronic GERD-with recent exacerbation possibly antibiotic-induced #3 COPD  Plan; Patient will be scheduled for colonoscopy and upper endoscopy with Richard Grimes. Both procedures were discussed in detail with the patient and his wife and they are agreeable to proceed.   We also briefly discussed capsule endoscopy should EGD and colon be unrevealing. Check Hemoccults Continue omeprazole but increase to 20 mg by mouth twice a day Review the antireflux regimen Patient has just started oral iron replacement and will continue.      Richard Wilk S Brayten Komar PA-C 09/26/2016   Cc: [name], MD

## 2016-10-12 ENCOUNTER — Other Ambulatory Visit (INDEPENDENT_AMBULATORY_CARE_PROVIDER_SITE_OTHER): Payer: BLUE CROSS/BLUE SHIELD

## 2016-10-12 DIAGNOSIS — D508 Other iron deficiency anemias: Secondary | ICD-10-CM | POA: Diagnosis not present

## 2016-10-12 LAB — FECAL OCCULT BLOOD, IMMUNOCHEMICAL: Fecal Occult Bld: NEGATIVE

## 2016-10-18 ENCOUNTER — Encounter: Payer: Self-pay | Admitting: Gastroenterology

## 2016-11-01 ENCOUNTER — Ambulatory Visit (AMBULATORY_SURGERY_CENTER): Payer: BLUE CROSS/BLUE SHIELD | Admitting: Gastroenterology

## 2016-11-01 ENCOUNTER — Encounter: Payer: Self-pay | Admitting: Gastroenterology

## 2016-11-01 VITALS — BP 121/82 | HR 68 | Temp 98.0°F | Resp 18

## 2016-11-01 DIAGNOSIS — D123 Benign neoplasm of transverse colon: Secondary | ICD-10-CM | POA: Diagnosis not present

## 2016-11-01 DIAGNOSIS — D120 Benign neoplasm of cecum: Secondary | ICD-10-CM | POA: Diagnosis not present

## 2016-11-01 DIAGNOSIS — D128 Benign neoplasm of rectum: Secondary | ICD-10-CM | POA: Diagnosis not present

## 2016-11-01 DIAGNOSIS — K219 Gastro-esophageal reflux disease without esophagitis: Secondary | ICD-10-CM | POA: Diagnosis not present

## 2016-11-01 DIAGNOSIS — D125 Benign neoplasm of sigmoid colon: Secondary | ICD-10-CM

## 2016-11-01 DIAGNOSIS — D124 Benign neoplasm of descending colon: Secondary | ICD-10-CM

## 2016-11-01 DIAGNOSIS — D508 Other iron deficiency anemias: Secondary | ICD-10-CM | POA: Diagnosis present

## 2016-11-01 DIAGNOSIS — D122 Benign neoplasm of ascending colon: Secondary | ICD-10-CM

## 2016-11-01 MED ORDER — SODIUM CHLORIDE 0.9 % IV SOLN: 500.0000 mL | INTRAVENOUS | Status: DC

## 2016-11-01 NOTE — Progress Notes (Signed)
Pt's states no medical or surgical changes since previsit or office visit. 

## 2016-11-01 NOTE — Progress Notes (Signed)
Called to room to assist during endoscopic procedure.  Patient ID and intended procedure confirmed with present staff. Received instructions for my participation in the procedure from the performing physician.  

## 2016-11-01 NOTE — Op Note (Signed)
[facility] Patient Name: Richard Grimes Procedure Date: 11/01/2016 1:59 PM MRN: [id number] Endoscopist: Mallie Mussel L. Loletha Carrow , MD Age: 66 Referring MD:  Date of Birth: 1950-07-10 Gender: Male Account #: [id number] Procedure:                Upper GI endoscopy Indications:              Iron deficiency anemia, , Heartburn (heme negative) Medicines:                Monitored Anesthesia Care Procedure:                Pre-Anesthesia Assessment:                           - Prior to the procedure, a History and Physical                            was performed, and patient medications and                            allergies were reviewed. The patient's tolerance of                            previous anesthesia was also reviewed. The risks                            and benefits of the procedure and the sedation                            options and risks were discussed with the patient.                            All questions were answered, and informed consent                            was obtained. Prior Anticoagulants: The patient has                            taken no previous anticoagulant or antiplatelet                            agents. ASA Grade Assessment: III - A patient with                            severe systemic disease. After reviewing the risks                            and benefits, the patient was deemed in                            satisfactory condition to undergo the procedure.                           After obtaining informed consent, the endoscope was  passed under direct vision. Throughout the                            procedure, the patient's blood pressure, pulse, and                            oxygen saturations were monitored continuously. The                            Model GIF-[id number]) scope was introduced                            through the mouth, and advanced to the second part                            of  duodenum. The upper GI endoscopy was                            accomplished without difficulty. The patient                            tolerated the procedure well. Scope In: Scope Out: Findings:                 The larynx was normal.                           The esophagus was normal. No esophagitis or                            Barrett's esophagus was seen.                           The stomach was normal.                           The cardia and gastric fundus were normal on                            retroflexion.                           The examined duodenum was normal. Complications:            No immediate complications. Estimated Blood Loss:     Estimated blood loss: none. Impression:               - Normal larynx.                           - Normal esophagus.                           - Normal stomach.                           - Normal examined duodenum.                           -  No specimens collected. Recommendation:           - Patient has a contact number available for                            emergencies. The signs and symptoms of potential                            delayed complications were discussed with the                            patient. Return to normal activities tomorrow.                            Written discharge instructions were provided to the                            patient.                           - Resume previous diet.                           - Continue present medications.                           - See the other procedure note for documentation of                            additional recommendations.                           - Follow an antireflux regimen indefinitely. Nikolis Berent L. Loletha Carrow, MD 11/01/2016 2:52:54 PM This report has been signed electronically.

## 2016-11-01 NOTE — Op Note (Signed)
[facility] Patient Name: Richard Grimes Procedure Date: 11/01/2016 1:59 PM MRN: [id number] Endoscopist: Mallie Mussel L. Loletha Carrow , MD Age: 66 Referring MD:  Date of Birth: 1950/10/06 Gender: Male Account #: [id number] Procedure:                Colonoscopy Indications:              Unexplained iron deficiency anemia Medicines:                Monitored Anesthesia Care Procedure:                Pre-Anesthesia Assessment:                           - Prior to the procedure, a History and Physical                            was performed, and patient medications and                            allergies were reviewed. The patient's tolerance of                            previous anesthesia was also reviewed. The risks                            and benefits of the procedure and the sedation                            options and risks were discussed with the patient.                            All questions were answered, and informed consent                            was obtained. Prior Anticoagulants: The patient has                            taken no previous anticoagulant or antiplatelet                            agents. ASA Grade Assessment: III - A patient with                            severe systemic disease. After reviewing the risks                            and benefits, the patient was deemed in                            satisfactory condition to undergo the procedure.                           After obtaining informed consent, the colonoscope  was passed under direct vision. Throughout the                            procedure, the patient's blood pressure, pulse, and                            oxygen saturations were monitored continuously. The                            Colonoscope was introduced through the anus and                            advanced to the the cecum, identified by                            appendiceal orifice and ileocecal  valve. The                            colonoscopy was performed without difficulty. The                            patient tolerated the procedure well. The quality                            of the bowel preparation was good. The ileocecal                            valve, appendiceal orifice, and rectum were                            photographed. The quality of the bowel preparation                            was evaluated using the BBPS [facility] Bowel                            Preparation Scale) with scores of: Right Colon = 2,                            Transverse Colon = 2 and Left Colon = 2. The total                            BBPS score equals 6. After lavage. The bowel                            preparation used was SUPREP. Scope In: 2:13:54 PM Scope Out: 7:79:39 PM Scope Withdrawal Time: 0 hours 25 minutes 21 seconds  Total Procedure Duration: 0 hours 32 minutes 25 seconds  Findings:                 The perianal and digital rectal examinations were                            normal.  Nine sessile polyps were found in the rectum,                            sigmoid colon, descending colon, transverse colon,                            ascending colon and cecum. The polyps were 2 to 8                            mm in size. These polyps were removed with a cold                            snare. Resection and retrieval were complete.                           Six sessile polyps were found in the descending                            colon, transverse colon and ascending colon. The                            polyps were 2 to 3 mm in size. These polyps were                            removed with a cold biopsy forceps. Resection and                            retrieval were complete.                           The exam was otherwise without abnormality on                            direct and retroflexion views. Complications:            No immediate  complications. Estimated Blood Loss:     Estimated blood loss: none. Impression:               - Nine 2 to 8 mm polyps in the rectum, in the                            sigmoid colon, in the descending colon, in the                            transverse colon, in the ascending colon and in the                            cecum, removed with a cold snare. Resected and                            retrieved.                           - Six 2  to 3 mm polyps in the descending colon, in                            the transverse colon and in the ascending colon,                            removed with a cold biopsy forceps. Resected and                            retrieved.                           - The examination was otherwise normal on direct                            and retroflexion views. Recommendation:           - Patient has a contact number available for                            emergencies. The signs and symptoms of potential                            delayed complications were discussed with the                            patient. Return to normal activities tomorrow.                            Written discharge instructions were provided to the                            patient.                           - Resume previous diet.                           - Continue present medications except iron.                           - No aspirin, ibuprofen, naproxen, or other                            non-steroidal anti-inflammatory drugs for 5 days                            after polyp removal.                           - Await pathology results.                           - Repeat colonoscopy is recommended for                            surveillance.  The colonoscopy date will be                            determined after pathology results from today's                            exam become available for review. 2 day prep for                            next colonoscopy.                            - Return to primary care physician as previously                            scheduled to re-check iron levels. Westlynn Fifer L. Loletha Carrow, MD 11/01/2016 2:58:31 PM This report has been signed electronically.

## 2016-11-01 NOTE — Progress Notes (Signed)
Report given to PACU, vss 

## 2016-11-01 NOTE — Patient Instructions (Addendum)
YOU HAD AN ENDOSCOPIC PROCEDURE TODAY AT [facility]:   Refer to the procedure report that was given to you for any specific questions about what was found during the examination.  If the procedure report does not answer your questions, please call your gastroenterologist to clarify.  If you requested that your care partner not be given the details of your procedure findings, then the procedure report has been included in a sealed envelope for you to review at your convenience later.  YOU SHOULD EXPECT: Some feelings of bloating in the abdomen. Passage of more gas than usual.  Walking can help get rid of the air that was put into your GI tract during the procedure and reduce the bloating. If you had a lower endoscopy (such as a colonoscopy or flexible sigmoidoscopy) you may notice spotting of blood in your stool or on the toilet paper. If you underwent a bowel prep for your procedure, you may not have a normal bowel movement for a few days.  Please Note:  You might notice some irritation and congestion in your nose or some drainage.  This is from the oxygen used during your procedure.  There is no need for concern and it should clear up in a day or so.  SYMPTOMS TO REPORT IMMEDIATELY:   Following lower endoscopy (colonoscopy or flexible sigmoidoscopy):  Excessive amounts of blood in the stool  Significant tenderness or worsening of abdominal pains  Swelling of the abdomen that is new, acute  Fever of 100F or higher   Following upper endoscopy (EGD)  Vomiting of blood or coffee ground material  New chest pain or pain under the shoulder blades  Painful or persistently difficult swallowing  New shortness of breath  Fever of 100F or higher  Black, tarry-looking stools  For urgent or emergent issues, a gastroenterologist can be reached at any hour by calling [phone].   DIET:  We do recommend a small meal at first, but then you may proceed to your regular diet.  Drink  plenty of fluids but you should avoid alcoholic beverages for 24 hours.  MEDICATIONS:  Continue present medications except IRON. No Aspirin, Ibuprofen, Naproxen, or other non-steroidal anti-inflammatory drugs for 5 days after polyp removal.  ACTIVITY:  You should plan to take it easy for the rest of today and you should NOT DRIVE or use heavy machinery until tomorrow (because of the sedation medicines used during the test).    Please see handouts given to you by your recovery nurse.  Follow an anti-reflux regimen.  Return to primary care physician as previously scheduled to re-check iron levels.  FOLLOW UP: Our staff will call the number listed on your records the next business day following your procedure to check on you and address any questions or concerns that you may have regarding the information given to you following your procedure. If we do not reach you, we will leave a message.  However, if you are feeling well and you are not experiencing any problems, there is no need to return our call.  We will assume that you have returned to your regular daily activities without incident.  If any biopsies were taken you will be contacted by phone or by letter within the next 1-3 weeks.  Please call us at [phone] if you have not heard about the biopsies in 3 weeks.   Thank you for allowing [place] to provide for your healthcare needs today.  SIGNATURES/CONFIDENTIALITY: You and/or your care  partner have signed paperwork which will be entered into your electronic medical record.  These signatures attest to the fact that that the information above on your After Visit Summary has been reviewed and is understood.  Full responsibility of the confidentiality of this discharge information lies with you and/or your care-partner.

## 2016-11-02 ENCOUNTER — Telehealth: Payer: Self-pay

## 2016-11-02 ENCOUNTER — Telehealth: Payer: Self-pay | Admitting: Gastroenterology

## 2016-11-02 NOTE — Telephone Encounter (Signed)
  Follow up Call-  Call back number 11/01/2016  Post procedure Call Back phone  # [phone]  Permission to leave phone message Yes  Some recent data might be hidden     Left message

## 2016-11-02 NOTE — Telephone Encounter (Signed)
Tried calling patient back concerning his phone note but there was no answer. I left a message about his concern and told him to call us if he had any other concerns or questions. Tried his home number as well but no answer or  answering machine.

## 2016-11-06 ENCOUNTER — Encounter: Payer: Self-pay | Admitting: Gastroenterology

## 2016-11-19 ENCOUNTER — Other Ambulatory Visit: Payer: Self-pay | Admitting: Family Medicine

## 2016-11-19 ENCOUNTER — Encounter: Payer: Self-pay | Admitting: Family Medicine

## 2016-12-19 ENCOUNTER — Encounter: Payer: Self-pay | Admitting: Family Medicine

## 2016-12-19 ENCOUNTER — Ambulatory Visit (INDEPENDENT_AMBULATORY_CARE_PROVIDER_SITE_OTHER): Payer: BLUE CROSS/BLUE SHIELD | Admitting: Family Medicine

## 2016-12-19 VITALS — BP 122/86 | HR 84 | Temp 98.2°F | Wt 213.0 lb

## 2016-12-19 DIAGNOSIS — D508 Other iron deficiency anemias: Secondary | ICD-10-CM | POA: Diagnosis not present

## 2016-12-19 DIAGNOSIS — R5383 Other fatigue: Secondary | ICD-10-CM | POA: Diagnosis not present

## 2016-12-19 LAB — IBC PANEL
Iron: 47 ug/dL (ref 42–165)
Saturation Ratios: 15.1 % — ABNORMAL LOW (ref 20.0–50.0)
TRANSFERRIN: 222 mg/dL (ref 212.0–360.0)

## 2016-12-19 LAB — CBC WITH DIFFERENTIAL/PLATELET
BASOS PCT: 1.3 % (ref 0.0–3.0)
Basophils Absolute: 0.1 10*3/uL (ref 0.0–0.1)
EOS PCT: 3.9 % (ref 0.0–5.0)
Eosinophils Absolute: 0.2 10*3/uL (ref 0.0–0.7)
HEMATOCRIT: 42.6 % (ref 39.0–52.0)
HEMOGLOBIN: 13.9 g/dL (ref 13.0–17.0)
LYMPHS PCT: 23.6 % (ref 12.0–46.0)
Lymphs Abs: 1.1 10*3/uL (ref 0.7–4.0)
MCHC: 32.7 g/dL (ref 30.0–36.0)
MCV: 79.3 fl (ref 78.0–100.0)
MONOS PCT: 10 % (ref 3.0–12.0)
Monocytes Absolute: 0.5 10*3/uL (ref 0.1–1.0)
NEUTROS ABS: 2.9 10*3/uL (ref 1.4–7.7)
Neutrophils Relative %: 61.2 % (ref 43.0–77.0)
PLATELETS: 262 10*3/uL (ref 150.0–400.0)
RBC: 5.37 Mil/uL (ref 4.22–5.81)
RDW: 17.9 % — AB (ref 11.5–15.5)
WBC: 4.7 10*3/uL (ref 4.0–10.5)

## 2016-12-19 LAB — FERRITIN: Ferritin: 77 ng/mL (ref 22.0–322.0)

## 2016-12-19 NOTE — Progress Notes (Signed)
Dr. Frederico Hamman T. Kaydin Labo, MD, [facility] Primary Care and Sports Medicine [place], [address] Phone: [phone] Fax: [phone]  12/19/2016  Patient: Richard Grimes, MRN: [id number], DOB: 24-Jan-1951, 66 y.o.  Primary Physician:  [name], MD   Chief Complaint  Patient presents with  . Follow-up   Subjective:   Richard Grimes is a 66 y.o. very pleasant male patient who presents with the following:  Quit taking iron - check hgb  F/u anemia.   CBC:    Component Value Date/Time   WBC 7.9 09/10/2016 1327   HGB 12.5 (L) 09/10/2016 1327   HCT 38.4 (L) 09/10/2016 1327   PLT 378 09/10/2016 1327   MCV 81.7 09/10/2016 1327   NEUTROABS 5,372 09/10/2016 1327   LYMPHSABS 1,264 09/10/2016 1327   MONOABS 1,027 (H) 09/10/2016 1327   EOSABS 237 09/10/2016 1327   BASOSABS 0 09/10/2016 1327    Off iron now per GI. Increased PPI dosing.  Also wants to retest his test levels.  Past Medical History, Surgical History, Social History, Family History, Problem List, Medications, and Allergies have been reviewed and updated if relevant.  Patient Active Problem List   Diagnosis Date Noted  . Allergic rhinitis due to pollen 07/18/2011  . DEPRESSION 05/24/2009  . COPD 05/24/2009  . GERD 05/24/2009    Past Medical History:  Diagnosis Date  . Allergic rhinitis due to pollen 07/18/2011  . COPD (chronic obstructive pulmonary disease) (HCC)    Severe, based on Spirometry.   . Depression   . GERD (gastroesophageal reflux disease)   . Iron deficiency anemia     Past Surgical History:  Procedure Laterality Date  . CARDIAC CATHETERIZATION  2000   Normal coronaries  . NASAL POLYP SURGERY      Social History   Social History  . Marital status: Divorced    Spouse name: [name]  . Number of children: N/A  . 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 Smoking status: Former Smoker     Quit date: 07/02/2004  . Smokeless tobacco: Never Used  . Alcohol use 0.0 oz/week     Comment: occ glass on wine  . Drug use: No  . Sexual activity: Yes    Partners: Female   Other Topics Concern  . Not on file   Social History Narrative  . No narrative on file    No family history on file.  Allergies  Allergen Reactions  . Penicillins Rash    Medication list reviewed and updated in full in [facility].   GEN: No acute illnesses, no fevers, chills. GI: No n/v/d, eating normally Pulm: No SOB Interactive and getting along well at home.  Otherwise, ROS is as per the HPI.  Objective:   BP 122/86   Pulse 84   Temp 98.2 F (36.8 C) (Oral)   Wt 213 lb (96.6 kg)   SpO2 96%   BMI 33.86 kg/m   GEN: WDWN, NAD, Non-toxic, A & O x 3 HEENT: Atraumatic, Normocephalic. Neck supple. No masses, No LAD. Ears and Nose: No external deformity. CV: RRR, No M/G/R. No JVD. No thrill. No extra heart sounds. PULM: CTA B, no wheezes, crackles, rhonchi. No retractions. No resp. distress. No accessory muscle use. EXTR: No c/c/e NEURO Normal gait.  PSYCH: Normally interactive. Conversant. Not depressed or anxious appearing.  Calm demeanor.   Laboratory and Imaging  Data:  Assessment and Plan:   Other iron deficiency anemia - Plan: CBC with Differential/Platelet, IBC panel, Ferritin  Other fatigue - Plan: Testos,Total,Free and SHBG (Male)  Check labs, o/w doing well.   Follow-up: No Follow-up on file.  Future Appointments Date Time Provider [place]  04/22/2017 8:00 AM [facility]  04/24/2017 8:30 AM Lynsee Wands, Frederico Hamman, MD [facility]    No orders of the defined types were placed in this encounter.  There are no discontinued medications. Orders Placed This Encounter  Procedures  . CBC with Differential/Platelet  . IBC panel  . Ferritin  . Testos,Total,Free and SHBG (Male)    Signed,  Frederico Hamman T. Dorthey Depace,  MD   Allergies as of 12/19/2016      Reactions   Penicillins Rash      Medication List       Accurate as of 12/19/16  8:33 AM. Always use your most recent med list.          DULERA 200-5 MCG/ACT Aero Generic drug:  mometasone-formoterol INHALE TWO PUFFS BY MOUTH TWICE DAILY   ferrous sulfate 325 (65 FE) MG tablet Take 325 mg by mouth daily with breakfast.   FLUoxetine 20 MG capsule Commonly known as:  PROZAC TAKE ONE CAPSULE BY MOUTH ONCE DAILY   omeprazole 20 MG capsule Commonly known as:  PRILOSEC Take 1 capsule (20 mg total) by mouth 2 (two) times daily before a meal.   PROVENTIL HFA 108 (90 Base) MCG/ACT inhaler Generic drug:  albuterol INHALE TWO PUFFS BY MOUTH EVERY 6 HOURS AS NEEDED FOR WHEEZING OR  SHORTNESS  OF  BREATH   SPIRIVA HANDIHALER 18 MCG inhalation capsule Generic drug:  tiotropium INHALE ONE DOSE BY MOUTH ONCE DAILY

## 2016-12-22 LAB — TESTOS,TOTAL,FREE AND SHBG (FEMALE)
SEX HORMONE BINDING GLOB.: 24 nmol/L (ref 22–77)
TESTOSTERONE,FREE: 64.8 pg/mL (ref 35.0–155.0)
TESTOSTERONE,TOTAL,LC/MS/MS: 364 ng/dL (ref 250–1100)

## 2017-01-20 ENCOUNTER — Other Ambulatory Visit: Payer: Self-pay | Admitting: Family Medicine

## 2017-02-22 ENCOUNTER — Other Ambulatory Visit: Payer: Self-pay | Admitting: Family Medicine

## 2017-03-05 ENCOUNTER — Other Ambulatory Visit: Payer: Self-pay | Admitting: Family Medicine

## 2017-04-11 ENCOUNTER — Ambulatory Visit (INDEPENDENT_AMBULATORY_CARE_PROVIDER_SITE_OTHER): Payer: BLUE CROSS/BLUE SHIELD | Admitting: Family Medicine

## 2017-04-11 ENCOUNTER — Encounter: Payer: Self-pay | Admitting: Family Medicine

## 2017-04-11 VITALS — BP 130/90 | HR 79 | Temp 98.3°F | Ht 66.5 in | Wt 212.5 lb

## 2017-04-11 DIAGNOSIS — J441 Chronic obstructive pulmonary disease with (acute) exacerbation: Secondary | ICD-10-CM | POA: Diagnosis not present

## 2017-04-11 DIAGNOSIS — L239 Allergic contact dermatitis, unspecified cause: Secondary | ICD-10-CM

## 2017-04-11 MED ORDER — TRIAMCINOLONE ACETONIDE 0.1 % EX CREA: 1.0000 "application " | TOPICAL_CREAM | Freq: Two times a day (BID) | CUTANEOUS | 1 refills | Status: DC

## 2017-04-11 MED ORDER — PREDNISONE 20 MG PO TABS: ORAL_TABLET | ORAL | 0 refills | Status: DC

## 2017-04-11 NOTE — Progress Notes (Signed)
Dr. Frederico Hamman T. Aaryav Hopfensperger, MD, [facility] Primary Care and Sports Medicine [place], [address] Phone: [phone] Fax: [phone]  04/11/2017  Patient: Richard Grimes, MRN: [id number], DOB: 10-21-1950, 66 y.o.  Primary Physician:  [name], MD   Chief Complaint  Patient presents with  . Cough    Bronchitis  . Legs Itching   Subjective:   Richard Grimes is a 66 y.o. very pleasant male patient who presents with the following:  Had some congestion, bronchitis. Took some azithromycin. He has some that he Denies travel case, and he took 6 days worth of Zithromax. He is feeling a little bit better, but he still having some difficulty breathing and coughing. He continues to be off of tobacco.  93% 02  Past Medical History, Surgical History, Social History, Family History, Problem List, Medications, and Allergies have been reviewed and updated if relevant.  Patient Active Problem List   Diagnosis Date Noted  . Allergic rhinitis due to pollen 07/18/2011  . DEPRESSION 05/24/2009  . COPD 05/24/2009  . GERD 05/24/2009    Past Medical History:  Diagnosis Date  . Allergic rhinitis due to pollen 07/18/2011  . COPD (chronic obstructive pulmonary disease) (HCC)    Severe, based on Spirometry.   . Depression   . GERD (gastroesophageal reflux disease)   . Iron deficiency anemia     Past Surgical History:  Procedure Laterality Date  . CARDIAC CATHETERIZATION  2000   Normal coronaries  . NASAL POLYP SURGERY      Social History   Social History  . Marital status: Divorced    Spouse name: [name]  . Number of children: N/A  . 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 Smoking status: Former Smoker    Quit date: 07/02/2004  . Smokeless tobacco: Never Used  . Alcohol use 0.0 oz/week     Comment: occ glass on wine  . Drug use: No  . Sexual activity: Yes    Partners: Female    Other Topics Concern  . Not on file   Social History Narrative  . No narrative on file    No family history on file.  Allergies  Allergen Reactions  . Penicillins Rash    Medication list reviewed and updated in full in [facility].  ROS: GEN: Acute illness details above GI: Tolerating PO intake GU: maintaining adequate hydration and urination Pulm: No SOB Interactive and getting along well at home.  Otherwise, ROS is as per the HPI.  Objective:   BP 130/90   Pulse 79   Temp 98.3 F (36.8 C) (Oral)   Ht 5' 6.5" (1.689 m)   Wt 212 lb 8 oz (96.4 kg)   SpO2 93%   BMI 33.78 kg/m    GEN: A and O x 3. WDWN. NAD.    ENT: Nose clear, ext NML.  No LAD.  No JVD.  TM's clear. Oropharynx clear.  PULM: Normal WOB, no distress. No crackles, scattered wheezes, rhonchi. CV: RRR, no M/G/R, No rubs, No JVD.   EXT: warm and well-perfused, No c/c/e. PSYCH: Pleasant and conversant.  Skin: scattered slightly discolored areas. These been treated with Neosporin.   Laboratory and Imaging Data:  Assessment and Plan:   COPD exacerbation ([place])  Allergic dermatitis  Adequately treated from an antibiotic standpoint, but only give a short amount of prednisone. Also topical triamcinolone for  areas rash.  Follow-up: No Follow-up on file.  Future Appointments Date Time Provider [place]  04/22/2017 8:00 AM [facility]  04/24/2017 8:30 AM Nayomi Tabron, Frederico Hamman, MD [facility]    Meds ordered this encounter  Medications  . azithromycin (ZITHROMAX) 250 MG tablet  . predniSONE (DELTASONE) 20 MG tablet    Sig: 2 tabs for 5 days, then 1 tab for 2 days    Dispense:  12 tablet    Refill:  0  . triamcinolone cream (KENALOG) 0.1 %    Sig: Apply 1 application topically 2 (two) times daily.    Dispense:  454 g    Refill:  1   Medications Discontinued During This Encounter  Medication Reason  . ferrous sulfate 325 (65 FE) MG tablet  Discontinued by provider   No orders of the defined types were placed in this encounter.   Signed,  Maud Deed. Avice Funchess, MD   Allergies as of 04/11/2017      Reactions   Penicillins Rash      Medication List       Accurate as of 04/11/17  2:21 PM. Always use your most recent med list.          azithromycin 250 MG tablet Commonly known as:  ZITHROMAX   DULERA 200-5 MCG/ACT Aero Generic drug:  mometasone-formoterol INHALE 2 PUFFS BY MOUTH TWICE DAILY   FLUoxetine 20 MG capsule Commonly known as:  PROZAC TAKE ONE CAPSULE BY MOUTH ONCE DAILY   omeprazole 20 MG capsule Commonly known as:  PRILOSEC Take 1 capsule (20 mg total) by mouth 2 (two) times daily before a meal.   predniSONE 20 MG tablet Commonly known as:  DELTASONE 2 tabs for 5 days, then 1 tab for 2 days   PROVENTIL HFA 108 (90 Base) MCG/ACT inhaler Generic drug:  albuterol INHALE TWO PUFFS BY MOUTH EVERY 6 HOURS AS NEEDED FOR WHEEZING OR  SHORTNESS  OF  BREATH   SPIRIVA HANDIHALER 18 MCG inhalation capsule Generic drug:  tiotropium INHALE ONE PUFF BY MOUTH ONCE DAILY   triamcinolone cream 0.1 % Commonly known as:  KENALOG Apply 1 application topically 2 (two) times daily.

## 2017-04-22 ENCOUNTER — Other Ambulatory Visit (INDEPENDENT_AMBULATORY_CARE_PROVIDER_SITE_OTHER): Payer: BLUE CROSS/BLUE SHIELD

## 2017-04-22 DIAGNOSIS — Z0000 Encounter for general adult medical examination without abnormal findings: Secondary | ICD-10-CM | POA: Diagnosis not present

## 2017-04-22 DIAGNOSIS — E559 Vitamin D deficiency, unspecified: Secondary | ICD-10-CM | POA: Diagnosis not present

## 2017-04-22 LAB — CBC WITH DIFFERENTIAL/PLATELET
BASOS ABS: 0.1 10*3/uL (ref 0.0–0.1)
Basophils Relative: 1.1 % (ref 0.0–3.0)
EOS ABS: 0.2 10*3/uL (ref 0.0–0.7)
Eosinophils Relative: 3.5 % (ref 0.0–5.0)
HCT: 45.1 % (ref 39.0–52.0)
Hemoglobin: 14.7 g/dL (ref 13.0–17.0)
LYMPHS ABS: 1.5 10*3/uL (ref 0.7–4.0)
Lymphocytes Relative: 21 % (ref 12.0–46.0)
MCHC: 32.7 g/dL (ref 30.0–36.0)
MCV: 85.3 fl (ref 78.0–100.0)
MONOS PCT: 9.2 % (ref 3.0–12.0)
Monocytes Absolute: 0.7 10*3/uL (ref 0.1–1.0)
NEUTROS ABS: 4.7 10*3/uL (ref 1.4–7.7)
NEUTROS PCT: 65.2 % (ref 43.0–77.0)
PLATELETS: 234 10*3/uL (ref 150.0–400.0)
RBC: 5.28 Mil/uL (ref 4.22–5.81)
RDW: 16.1 % — ABNORMAL HIGH (ref 11.5–15.5)
WBC: 7.2 10*3/uL (ref 4.0–10.5)

## 2017-04-22 LAB — HEPATIC FUNCTION PANEL
ALK PHOS: 99 U/L (ref 39–117)
ALT: 15 U/L (ref 0–53)
AST: 11 U/L (ref 0–37)
Albumin: 3.7 g/dL (ref 3.5–5.2)
BILIRUBIN DIRECT: 0.1 mg/dL (ref 0.0–0.3)
BILIRUBIN TOTAL: 0.4 mg/dL (ref 0.2–1.2)
Total Protein: 6 g/dL (ref 6.0–8.3)

## 2017-04-22 LAB — LIPID PANEL
Cholesterol: 153 mg/dL (ref 0–200)
HDL: 54.1 mg/dL (ref >39.00)
LDL Cholesterol: 74 mg/dL (ref 0–99)
NONHDL: 98.91
Total CHOL/HDL Ratio: 3
Triglycerides: 125 mg/dL (ref 0.0–149.0)
VLDL: 25 mg/dL (ref 0.0–40.0)

## 2017-04-22 LAB — BASIC METABOLIC PANEL
BUN: 11 mg/dL (ref 6–23)
CALCIUM: 8.9 mg/dL (ref 8.4–10.5)
CO2: 30 meq/L (ref 19–32)
CREATININE: 0.83 mg/dL (ref 0.40–1.50)
Chloride: 104 mEq/L (ref 96–112)
GFR: 98.95 mL/min (ref >60.00)
GLUCOSE: 105 mg/dL — AB (ref 70–99)
Potassium: 4.5 mEq/L (ref 3.5–5.1)
Sodium: 140 mEq/L (ref 135–145)

## 2017-04-22 LAB — PSA: PSA: 0.4 ng/mL (ref 0.10–4.00)

## 2017-04-22 LAB — TSH: TSH: 2.02 u[IU]/mL (ref 0.35–4.50)

## 2017-04-22 LAB — VITAMIN D 25 HYDROXY (VIT D DEFICIENCY, FRACTURES): VITD: 28.27 ng/mL — AB (ref 30.00–100.00)

## 2017-04-24 ENCOUNTER — Encounter: Payer: Self-pay | Admitting: Family Medicine

## 2017-04-24 ENCOUNTER — Ambulatory Visit (INDEPENDENT_AMBULATORY_CARE_PROVIDER_SITE_OTHER): Payer: BLUE CROSS/BLUE SHIELD | Admitting: Family Medicine

## 2017-04-24 VITALS — BP 130/80 | HR 84 | Temp 98.5°F | Ht 67.0 in | Wt 213.8 lb

## 2017-04-24 DIAGNOSIS — L989 Disorder of the skin and subcutaneous tissue, unspecified: Secondary | ICD-10-CM

## 2017-04-24 DIAGNOSIS — Z23 Encounter for immunization: Secondary | ICD-10-CM

## 2017-04-24 DIAGNOSIS — Z0000 Encounter for general adult medical examination without abnormal findings: Secondary | ICD-10-CM | POA: Diagnosis not present

## 2017-04-24 NOTE — Progress Notes (Signed)
Dr. Frederico Hamman T. Ziggy Reveles, MD, [facility] [place], [address] Phone: [phone] Fax: [phone]  04/24/2017  Patient: Richard Grimes, MRN: [id number], DOB: 17-Aug-1950, 66 y.o.  Primary Physician:  [name], MD   Chief Complaint  Patient presents with  . Annual Exam   Subjective:   Richard Grimes is a 66 y.o. pleasant patient who presents with the following:  Preventative Health Maintenance Visit:  Health Maintenance Summary Reviewed and updated, unless pt declines services.  Tobacco History Reviewed. Alcohol: No concerns, no excessive use Exercise Habits: Some activity, rec at least 30 mins 5 times a week STD concerns: no risk or activity to increase risk Drug Use: None Encouraged self-testicular check  Health Maintenance  Topic Date Due  . COLON CANCER SCREENING ANNUAL FOBT  11/01/2017  . Fecal DNA (Cologuard)  05/15/2019  . TETANUS/TDAP  04/23/2026  . INFLUENZA VACCINE  Completed  . Hepatitis C Screening  Completed  . HIV Screening  Completed   Immunization History  Administered Date(s) Administered  . Influenza Whole 05/24/2009  . Influenza,inj,Quad PF,6+ Mos 04/24/2017  . Pneumococcal Conjugate-13 01/20/2014  . Pneumococcal Polysaccharide-23 01/04/2011  . Tdap 04/23/2016  . Zoster 01/20/2014   Patient Active Problem List   Diagnosis Date Noted  . Allergic rhinitis due to pollen 07/18/2011  . DEPRESSION 05/24/2009  . COPD 05/24/2009  . GERD 05/24/2009   Past Medical History:  Diagnosis Date  . Allergic rhinitis due to pollen 07/18/2011  . COPD (chronic obstructive pulmonary disease) (HCC)    Severe, based on Spirometry.   . Depression   . GERD (gastroesophageal reflux disease)   . Iron deficiency anemia    Past Surgical History:  Procedure Laterality Date  . CARDIAC CATHETERIZATION  2000   Normal coronaries  . NASAL POLYP SURGERY     Social History   Social History  .  Marital status: Divorced    Spouse name: [name]  . Number of children: N/A  . 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 Smoking status: Former Smoker    Quit date: 07/02/2004  . Smokeless tobacco: Never Used  . Alcohol use 0.0 oz/week     Comment: occ glass on wine  . Drug use: No  . Sexual activity: Yes    Partners: Female   Other Topics Concern  . Not on file   Social History Narrative  . No narrative on file   No family history on file. Allergies  Allergen Reactions  . Penicillins Rash    Medication list has been reviewed and updated.   General: Denies fever, chills, sweats. No significant weight loss. Eyes: Denies blurring,significant itching ENT: Denies earache, sore throat, and hoarseness. Cardiovascular: Denies chest pains, palpitations, dyspnea on exertion Respiratory: Denies cough, dyspnea at rest,wheeezing Breast: no concerns about lumps GI: Denies nausea, vomiting, diarrhea, constipation, change in bowel habits, abdominal pain, melena, hematochezia GU: Denies penile discharge, ED, urinary flow / outflow problems. No STD concerns. Musculoskeletal: Denies back pain, joint pain Derm: Denies rash, itching Neuro: Denies  paresthesias, frequent falls, frequent headaches Psych: Denies depression, anxiety Endocrine: Denies cold intolerance, heat intolerance, polydipsia Heme: Denies enlarged lymph nodes Allergy: No hayfever  Objective:   BP 130/80   Pulse 84   Temp 98.5 F (36.9 C) (Oral)   Ht '5\' 7"'  (1.702 m)   Wt 213 lb 12 oz (97  kg)   BMI 33.48 kg/m  Ideal Body Weight: Weight in (lb) to have BMI = 25: 159.3  No exam data present  GEN: well developed, well nourished, no acute distress Eyes: conjunctiva and lids normal, PERRLA, EOMI ENT: TM clear, nares clear, oral exam WNL Neck: supple, no lymphadenopathy, no thyromegaly, no JVD Pulm: clear to auscultation and percussion,  respiratory effort normal CV: regular rate and rhythm, S1-S2, no murmur, rub or gallop, no bruits, peripheral pulses normal and symmetric, no cyanosis, clubbing, edema or varicosities GI: soft, non-tender; no hepatosplenomegaly, masses; active bowel sounds all quadrants GU: no hernia, testicular mass, penile discharge Lymph: no cervical, axillary or inguinal adenopathy MSK: gait normal, muscle tone and strength WNL, no joint swelling, effusions, discoloration, crepitus  SKIN: clear, good turgor, color WNL. R ear with small area, healing ulcer and crusty Neuro: normal mental status, normal strength, sensation, and motion Psych: alert; oriented to person, place and time, normally interactive and not anxious or depressed in appearance. All labs reviewed with patient.  Lipids:    Component Value Date/Time   CHOL 153 04/22/2017 0824   TRIG 125.0 04/22/2017 0824   HDL 54.10 04/22/2017 0824   LDLDIRECT 85.4 01/04/2011 1054   VLDL 25.0 04/22/2017 0824   CHOLHDL 3 04/22/2017 0824   CBC: CBC Latest Ref Rng & Units 04/22/2017 12/19/2016 09/10/2016  WBC 4.0 - 10.5 K/uL 7.2 4.7 7.9  Hemoglobin 13.0 - 17.0 g/dL 14.7 13.9 12.5(L)  Hematocrit 39.0 - 52.0 % 45.1 42.6 38.4(L)  Platelets 150.0 - 400.0 K/uL 234.0 262.0 009    Basic Metabolic Panel:    Component Value Date/Time   NA 140 04/22/2017 0824   K 4.5 04/22/2017 0824   CL 104 04/22/2017 0824   CO2 30 04/22/2017 0824   BUN 11 04/22/2017 0824   CREATININE 0.83 04/22/2017 0824   CREATININE 0.75 09/10/2016 1327   GLUCOSE 105 (H) 04/22/2017 0824   CALCIUM 8.9 04/22/2017 0824   Hepatic Function Latest Ref Rng & Units 04/22/2017 09/10/2016 04/20/2016  Total Protein 6.0 - 8.3 g/dL 6.0 6.5 6.7  Albumin 3.5 - 5.2 g/dL 3.7 3.4(L) 4.2  AST 0 - 37 U/L '11 12 17  ' ALT 0 - 53 U/L '15 14 22  ' Alk Phosphatase 39 - 117 U/L 99 93 127(H)  Total Bilirubin 0.2 - 1.2 mg/dL 0.4 0.5 0.6  Bilirubin, Direct 0.0 - 0.3 mg/dL 0.1 0.5(H) 0.1    Lab Results    Component Value Date   TSH 2.02 04/22/2017   Lab Results  Component Value Date   PSA 0.40 04/22/2017   PSA 0.47 04/20/2016   PSA 0.58 01/17/2015    Assessment and Plan:   Healthcare maintenance  Skin lesion - Plan: Ambulatory referral to Dermatology  Need for prophylactic vaccination and inoculation against influenza - Plan: Flu Vaccine QUAD 36+ mos IM  I'm worried that he may have a basal cell cancer on his right ear.  Consult dermatology.  Health Maintenance Exam: The patient's preventative maintenance and recommended screening tests for an annual wellness exam were reviewed in full today. Brought up to date unless services declined.  Counselled on the importance of diet, exercise, and its role in overall health and mortality. The patient's FH and SH was reviewed, including their home life, tobacco status, and drug and alcohol status.  Follow-up in 1 year for physical exam or additional follow-up below.  Follow-up: No Follow-up on file. Or follow-up in 1 year if not noted.  No orders of  the defined types were placed in this encounter.  Medications Discontinued During This Encounter  Medication Reason  . predniSONE (DELTASONE) 20 MG tablet Completed Course  . azithromycin (ZITHROMAX) 250 MG tablet Completed Course   Orders Placed This Encounter  Procedures  . Flu Vaccine QUAD 36+ mos IM  . Ambulatory referral to Dermatology    Signed,  Frederico Hamman T. Kaley Jutras, MD   Allergies as of 04/24/2017      Reactions   Penicillins Rash      Medication List       Accurate as of 04/24/17 11:59 PM. Always use your most recent med list.          DULERA 200-5 MCG/ACT Aero Generic drug:  mometasone-formoterol INHALE 2 PUFFS BY MOUTH TWICE DAILY   FLUoxetine 20 MG capsule Commonly known as:  PROZAC TAKE ONE CAPSULE BY MOUTH ONCE DAILY   omeprazole 20 MG capsule Commonly known as:  PRILOSEC Take 1 capsule (20 mg total) by mouth 2 (two) times daily before a meal.    PROVENTIL HFA 108 (90 Base) MCG/ACT inhaler Generic drug:  albuterol INHALE TWO PUFFS BY MOUTH EVERY 6 HOURS AS NEEDED FOR WHEEZING OR  SHORTNESS  OF  BREATH   SPIRIVA HANDIHALER 18 MCG inhalation capsule Generic drug:  tiotropium INHALE ONE PUFF BY MOUTH ONCE DAILY   triamcinolone cream 0.1 % Commonly known as:  KENALOG Apply 1 application topically 2 (two) times daily.

## 2017-04-24 NOTE — Patient Instructions (Signed)

## 2017-06-18 ENCOUNTER — Encounter: Payer: Self-pay | Admitting: Family Medicine

## 2017-06-19 ENCOUNTER — Other Ambulatory Visit: Payer: Self-pay | Admitting: Family Medicine

## 2017-06-19 DIAGNOSIS — J449 Chronic obstructive pulmonary disease, unspecified: Secondary | ICD-10-CM

## 2017-06-20 ENCOUNTER — Other Ambulatory Visit: Payer: Self-pay | Admitting: Family Medicine

## 2017-06-20 ENCOUNTER — Other Ambulatory Visit: Payer: Self-pay | Admitting: Physician Assistant

## 2017-06-20 DIAGNOSIS — D508 Other iron deficiency anemias: Secondary | ICD-10-CM

## 2017-07-19 ENCOUNTER — Institutional Professional Consult (permissible substitution): Payer: BLUE CROSS/BLUE SHIELD | Admitting: Pulmonary Disease

## 2017-07-23 ENCOUNTER — Ambulatory Visit (INDEPENDENT_AMBULATORY_CARE_PROVIDER_SITE_OTHER)
Admission: RE | Admit: 2017-07-23 | Discharge: 2017-07-23 | Disposition: A | Discharge status: Home / Self Care | Payer: BLUE CROSS/BLUE SHIELD | Source: Ambulatory Visit | Attending: Pulmonary Disease | Admitting: Pulmonary Disease

## 2017-07-23 ENCOUNTER — Encounter: Payer: Self-pay | Admitting: Pulmonary Disease

## 2017-07-23 ENCOUNTER — Ambulatory Visit: Payer: BLUE CROSS/BLUE SHIELD | Admitting: Pulmonary Disease

## 2017-07-23 VITALS — BP 134/76 | HR 108 | Ht 67.0 in | Wt 217.0 lb

## 2017-07-23 DIAGNOSIS — R0602 Shortness of breath: Secondary | ICD-10-CM

## 2017-07-23 NOTE — Progress Notes (Addendum)
Richard Grimes    [id number]    Nov 25, 1950  Primary Care Physician:Copland, Frederico Hamman, MD  Referring Physician: [name], MD [place], [place] [address]  Chief complaint: Consult for evaluation of severe COPD, interstitial lung disease  HPI: 67 year old with history of asthma as a child, COPD.  His mother is my patient with recent diagnosis of IPF.  He is here at the advice of her mother to get his lungs checked out as well. Diagnosed with COPD in 2010 with mild symptoms of dyspnea on exertion.  He has productive cough with white mucus, denies any fevers, chills.  He is currently maintained on Dulera and Spiriva with about 1 exacerbation every year.  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  Previous exposure to chemicals including fumes, polymers.  Denies any asbestos, Architect exposure.  No mold exposure, no hot tubs. Smoking history: 70-pack-year smoking history.  Quit in 2004. Travel History: No recent travel.  Outpatient Encounter Medications as of 07/23/2017  Medication Sig  . DULERA 200-5 MCG/ACT AERO INHALE 2 PUFFS BY MOUTH TWICE DAILY  . FLUoxetine (PROZAC) 20 MG capsule TAKE ONE CAPSULE BY MOUTH ONCE DAILY  . omeprazole (PRILOSEC) 20 MG capsule Take 1 capsule (20 mg total) by mouth 2 (two) times daily before a meal.  . PROVENTIL HFA 108 (90 Base) MCG/ACT inhaler INHALE TWO PUFFS BY MOUTH EVERY 6 HOURS AS NEEDED FOR WHEEZING OR  SHORTNESS  OF  BREATH  . SPIRIVA HANDIHALER 18 MCG inhalation capsule INHALE ONE PUFF BY MOUTH ONCE DAILY  . triamcinolone cream (KENALOG) 0.1 % Apply 1 application topically 2 (two) times daily.   Facility-Administered Encounter Medications as of 07/23/2017  Medication  . 0.9 %  sodium chloride infusion    Allergies as of 07/23/2017 - Review Complete 07/23/2017  Allergen Reaction Noted  . Penicillins Rash 01/09/2013     Past Medical History:  Diagnosis Date  . Allergic rhinitis due to pollen 07/18/2011  . COPD (chronic obstructive pulmonary disease) (HCC)    Severe, based on Spirometry.   . Depression   . GERD (gastroesophageal reflux disease)   . Iron deficiency anemia     Past Surgical History:  Procedure Laterality Date  . CARDIAC CATHETERIZATION  2000   Normal coronaries  . NASAL POLYP SURGERY      Family History  Problem Relation Age of Onset  . Pulmonary fibrosis Mother   . GER disease Mother   . Hypertension Mother   . Hypertension Father   . Deep vein thrombosis Maternal Grandfather   . Deep vein thrombosis Paternal Grandmother   . Deep vein thrombosis Paternal Grandfather     Social History   Socioeconomic History  . Marital status: Divorced    Spouse name: [name]  . Number of children: Not on file  . Years of education: Not on file  . Highest education level: Not on file  Social Needs  . Financial resource strain: Not on file  . Food insecurity - worry: Not on file  . Food insecurity - inability: Not on file  . Transportation needs - medical: Not on file  . Transportation needs - non-medical: Not on file  Occupational History  . Occupation: fumigation    Employer: [employer]  Tobacco Use  . Smoking status: Former Smoker    Last attempt to quit: 07/02/2004    Years since  quitting: 13.0  . Smokeless tobacco: Never Used  Substance and Sexual Activity  . Alcohol use: Yes    Alcohol/week: 0.0 oz    Comment: occ glass on wine  . Drug use: No  . Sexual activity: Yes    Partners: Female  Other Topics Concern  . Not on file  Social History Narrative  . Not on file    Review of systems: Review of Systems  Constitutional: Negative for fever and chills.  HENT: Negative.   Eyes: Negative for blurred vision.  Respiratory: as per HPI  Cardiovascular: Negative for chest pain and palpitations.  Gastrointestinal: Negative for vomiting, diarrhea, blood per  rectum. Genitourinary: Negative for dysuria, urgency, frequency and hematuria.  Musculoskeletal: Negative for myalgias, back pain and joint pain.  Skin: Negative for itching and rash.  Neurological: Negative for dizziness, tremors, focal weakness, seizures and loss of consciousness.  Endo/Heme/Allergies: Negative for environmental allergies.  Psychiatric/Behavioral: Negative for depression, suicidal ideas and hallucinations.  All other systems reviewed and are negative.  Physical Exam: Blood pressure 134/76, pulse (!) 108, height 5\' 7"  (1.702 m), weight 217 lb (98.4 kg), SpO2 94 %. Gen:      No acute distress HEENT:  EOMI, sclera anicteric Neck:     No masses; no thyromegaly Lungs:    Clear to auscultation bilaterally; normal respiratory effort CV:         Regular rate and rhythm; no murmurs Abd:      + bowel sounds; soft, non-tender; no palpable masses, no distension Ext:    No edema; adequate peripheral perfusion Skin:      Warm and dry; no rash Neuro: alert and oriented x 3 Psych: normal mood and affect  Data Reviewed: Chest x-ray 07/27/15-left basilar atelectasis Chest x-ray 08/30/15-hyperinflation.  No lung infiltrate or nodule I reviewed the images personally.  Assessment:  Emphysema Likely has COPD based on his smoking history and hyperinflation on chest x-ray.  Continues on [place].  He may benefit from switching to trelegy as he has high co-pay on 2 different inhalers.  However he has several month supply and would like to continue the current inhalers.  Reevaluate at next visit  Concern for interstitial lung disease as his mother was recently diagnosed with IPF.  We discussed getting a CT chest.  He would like to defer this for now.  Will evaluate with chest x-ray and PFTs.  Plan/Recommendations: - Continue Dulera and Spiriva - Chest x-ray, PFTs  Marshell Garfinkel MD [facility] Pager [phone] 07/23/2017, 10:53 AM  CC: [name], MD

## 2017-07-23 NOTE — Patient Instructions (Signed)
Continue using the Dulera and Spiriva. We will discuss at return visit you on an switch to trelegy inhaler We will schedule you for a chest x-ray and pulmonary function test Return to clinic in 2-3 months.

## 2017-07-24 ENCOUNTER — Other Ambulatory Visit: Payer: Self-pay | Admitting: Family Medicine

## 2017-07-25 ENCOUNTER — Telehealth: Payer: Self-pay | Admitting: Pulmonary Disease

## 2017-07-25 DIAGNOSIS — J849 Interstitial pulmonary disease, unspecified: Secondary | ICD-10-CM

## 2017-07-25 NOTE — Telephone Encounter (Signed)
Notes recorded by Marshell Garfinkel, MD on 07/24/2017 at 2:51 PM EST Please let the patient know that the CXR shows changes consistent with bronchitis. However I am unable to completely rule out lung fibrosis. There is a spot on lung that I would like evaluated as well.  Please order High res CT of chest. Indication- ILD ------------------ Spoke with pt, aware of results/recs.  Ct ordered.  Nothing further needed.

## 2017-08-07 ENCOUNTER — Ambulatory Visit: Payer: BLUE CROSS/BLUE SHIELD

## 2017-08-20 ENCOUNTER — Encounter: Payer: Self-pay | Admitting: Pulmonary Disease

## 2017-08-22 ENCOUNTER — Other Ambulatory Visit: Payer: Self-pay

## 2017-08-22 ENCOUNTER — Encounter: Payer: Self-pay | Admitting: Pulmonary Disease

## 2017-08-22 MED ORDER — FLUTICASONE-UMECLIDIN-VILANT 100-62.5-25 MCG/INH IN AEPB: 1.0000 | INHALATION_SPRAY | Freq: Every day | RESPIRATORY_TRACT | 5 refills | Status: DC

## 2017-08-22 NOTE — Telephone Encounter (Signed)
Ok to order trelegy instead of spiriva and dulera. Thanks

## 2017-08-22 NOTE — Telephone Encounter (Signed)
Dr. Vaughan Browner please advise if ok to switch patient to Trelegy- see below email.  Thanks!   Dr. Annitta Jersey  Please call in a prescription for Trelegy for me. I am almost out of Spiriva, and it has been refilled for 90 days in the past. Trelegy is a tier 3 drug, and would be cheaper for me than Spiriva (tier 3) plus Dulare (tier 4). My pharmacy is   [place], [place]  If you do not feel comfortable doing this, I will contact my PCP, Dr. Frederico Hamman Copland.  Please let me know.   Thank you,  Dellie Burns

## 2017-09-04 ENCOUNTER — Ambulatory Visit
Admission: RE | Admit: 2017-09-04 | Discharge: 2017-09-04 | Disposition: A | Discharge status: Home / Self Care | Payer: Medicare Other | Source: Ambulatory Visit | Attending: Pulmonary Disease | Admitting: Pulmonary Disease

## 2017-09-04 DIAGNOSIS — J849 Interstitial pulmonary disease, unspecified: Secondary | ICD-10-CM | POA: Diagnosis present

## 2017-09-04 DIAGNOSIS — I700 Atherosclerosis of aorta: Secondary | ICD-10-CM | POA: Diagnosis not present

## 2017-09-04 DIAGNOSIS — I2510 Atherosclerotic heart disease of native coronary artery without angina pectoris: Secondary | ICD-10-CM | POA: Insufficient documentation

## 2017-09-04 DIAGNOSIS — J439 Emphysema, unspecified: Secondary | ICD-10-CM | POA: Diagnosis not present

## 2017-09-12 ENCOUNTER — Other Ambulatory Visit: Payer: Self-pay

## 2017-09-12 ENCOUNTER — Encounter: Payer: Self-pay | Admitting: Family Medicine

## 2017-09-12 DIAGNOSIS — R911 Solitary pulmonary nodule: Secondary | ICD-10-CM

## 2017-09-12 NOTE — Telephone Encounter (Signed)
Can you help him see me next week face to face?  I do think this is reasonably important, but it is not an emergency.

## 2017-09-16 ENCOUNTER — Ambulatory Visit: Payer: Medicare Other | Admitting: Family Medicine

## 2017-09-16 ENCOUNTER — Other Ambulatory Visit: Payer: Self-pay

## 2017-09-16 ENCOUNTER — Encounter: Payer: Self-pay | Admitting: Family Medicine

## 2017-09-16 VITALS — BP 130/98 | HR 89 | Temp 98.4°F | Ht 67.0 in | Wt 220.2 lb

## 2017-09-16 DIAGNOSIS — I2510 Atherosclerotic heart disease of native coronary artery without angina pectoris: Secondary | ICD-10-CM | POA: Diagnosis not present

## 2017-09-16 DIAGNOSIS — I700 Atherosclerosis of aorta: Secondary | ICD-10-CM | POA: Insufficient documentation

## 2017-09-16 DIAGNOSIS — J431 Panlobular emphysema: Secondary | ICD-10-CM

## 2017-09-16 DIAGNOSIS — K219 Gastro-esophageal reflux disease without esophagitis: Secondary | ICD-10-CM | POA: Diagnosis not present

## 2017-09-16 DIAGNOSIS — Z23 Encounter for immunization: Secondary | ICD-10-CM

## 2017-09-16 NOTE — Progress Notes (Signed)
Cardiology Office Note  Date:  09/18/2017   ID:  Richard Grimes, DOB 08-10-1950, MRN [id number]  PCP:  Richard Loffler, MD   Chief Complaint  Patient presents with  . other    Ref by Dr. Lorelei Grimes for abnormal CT scan. Meds reviewed by the pt. verbally. Pt. c/o indigestion and occas. shortness of breath with over exertion.     HPI:  [name] is a 67 year old gentleman with past medical history of Smoking , quit 2004 Centrolobular emphysema, Aortic calcification, seen on CT scan Who presents by referral from Dr. Lorelei Grimes for consultation of his aortic atherosclerosis, coronary calcification  Reports having some chronic mild shortness of breath with exertion Attributes this to weight gain over the past several years, long history of smoking He has mild emphysema 15 pound weight gain in the past 12 months Eating the wrong foods, not exercising He feels his weight is up 40 pounds over several years Denies any dramatic worsening in his symptoms  Official report of the CT chest reads as follows Aortic atherosclerosis, as well as atherosclerosis of the great vessels of the mediastinum and the coronary arteries, including calcified atherosclerotic plaque in the left anterior descending and left circumflex coronary arteries. Calcifications of the aortic valve  We pulled up the CT scan images and reviewed these in detail Mild aortic atherosclerosis noted in the aortic arch Minimal in the descending aorta Minimal in the ascending aorta Very mild calcification in the LAD, more moderate calcification in the mid left circumflex Minimal on the aortic valve  Lab work reviewed with him Total chol 153, LDL 74 Not on a statin  EKG personally reviewed by myself on todays visit Shows normal sinus rhythm rate 81 bpm right bundle branch block borderline Q waves 3 and aVF, unable to exclude old inferior MI   PMH:   has a past medical history of Allergic rhinitis due to pollen (07/18/2011),  COPD (chronic obstructive pulmonary disease) ([place]), Depression, GERD (gastroesophageal reflux disease), and Iron deficiency anemia.  PSH:    Past Surgical History:  Procedure Laterality Date  . CARDIAC CATHETERIZATION  2000   Normal coronaries  . NASAL POLYP SURGERY      Current Outpatient Medications  Medication Sig Dispense Refill  . aspirin 81 MG tablet Take 81 mg by mouth daily.    Marland Kitchen FLUoxetine (PROZAC) 20 MG capsule TAKE 1 CAPSULE BY MOUTH ONCE DAILY 90 capsule 1  . Fluticasone-Umeclidin-Vilant (TRELEGY ELLIPTA) 100-62.5-25 MCG/INH AEPB Inhale 1 puff into the lungs daily. 60 each 5  . omeprazole (PRILOSEC) 20 MG capsule Take 1 capsule (20 mg total) by mouth 2 (two) times daily before a meal. 180 capsule 3  . PROVENTIL HFA 108 (90 Base) MCG/ACT inhaler INHALE TWO PUFFS BY MOUTH EVERY 6 HOURS AS NEEDED FOR WHEEZING OR  SHORTNESS  OF  BREATH 7 each 3  . triamcinolone cream (KENALOG) 0.1 % Apply 1 application topically 2 (two) times daily. 454 g 1  . atorvastatin (LIPITOR) 10 MG tablet Take 1 tablet (10 mg total) by mouth daily. 90 tablet 3   Current Facility-Administered Medications  Medication Dose Route Frequency Provider Last Rate Last Dose  . 0.9 %  sodium chloride infusion  500 mL Intravenous Continuous Danis, Kirke Corin, MD         Allergies:   Penicillins   Social History:  The patient  reports that he quit smoking about 13 years ago. His smoking use included cigarettes. He has a 70.00 pack-year  smoking history. he has never used smokeless tobacco. He reports that he drinks alcohol. He reports that he does not use drugs.   Family History:   family history includes Deep vein thrombosis in his maternal grandfather, paternal grandfather, and paternal grandmother; GER disease in his mother; Hypertension in his father and mother; Pulmonary fibrosis in his mother.    Review of Systems: Review of Systems  Constitutional: Negative.   Respiratory: Positive for shortness of  breath.   Cardiovascular: Negative.   Gastrointestinal: Negative.   Musculoskeletal: Negative.   Neurological: Negative.   Psychiatric/Behavioral: Negative.   All other systems reviewed and are negative.    PHYSICAL EXAM: VS:  BP (!) 130/92 (BP Location: Right Arm, Patient Position: Sitting, Cuff Size: Normal)   Pulse 79   Ht 5\' 7"  (1.702 m)   Wt 219 lb 4 oz (99.5 kg)   BMI 34.34 kg/m  , BMI Body mass index is 34.34 kg/m. GEN: Well nourished, well developed, in no acute distress  HEENT: normal  Neck: no JVD, carotid bruits, or masses Cardiac: RRR; no murmurs, rubs, or gallops,no edema  Respiratory:  clear to auscultation bilaterally, normal work of breathing GI: soft, nontender, nondistended, + BS MS: no deformity or atrophy  Skin: warm and dry, no rash Neuro:  Strength and sensation are intact Psych: euthymic mood, full affect    Recent Labs: 04/22/2017: ALT 15; BUN 11; Creatinine, Ser 0.83; Hemoglobin 14.7; Platelets 234.0; Potassium 4.5; Sodium 140; TSH 2.02    Lipid Panel Lab Results  Component Value Date   CHOL 153 04/22/2017   HDL 54.10 04/22/2017   LDLCALC 74 04/22/2017   TRIG 125.0 04/22/2017      Wt Readings from Last 3 Encounters:  09/18/17 219 lb 4 oz (99.5 kg)  09/16/17 220 lb 4 oz (99.9 kg)  07/23/17 217 lb (98.4 kg)       ASSESSMENT AND PLAN:  Aortic atherosclerosis (HCC) Mild aortic atherosclerosis noted After long discussion, Recommended aspirin and statin, Will push his LDL lower preferably less than 60  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 (HCC)  recommended weight loss, diet, lifestyle modification Much of his shortness of breath likely secondary to 40 pound weight gain over several years  Panlobular emphysema ([place]) Long history of smoking, Recommended he lose weight, work on his conditioning  Coronary artery calcification seen on CT scan - Plan: EKG 12-Lead Very mild in the LAD,  more moderate in the mid circumflex EKG with questionable inferior wall findings He feels he is asymptomatic, shortness of breath not from cardiac etiology Long discussion concerning whether to do stress testing For now we will treat aggressively and he will call us for any worsening shortness of breath or chest tightness.  For new symptoms would order pharmacologic Myoview  Smoker Reports that he stopped many years ago Underlying emphysema  Disposition:   F/U as needed   Total encounter time more than 60 minutes  Greater than 50% was spent in counseling and coordination of care with the patient    Orders Placed This Encounter  Procedures  . EKG 12-Lead     Signed, Esmond Plants, M.D., Ph.D. 09/18/2017  [place], [place]

## 2017-09-16 NOTE — Progress Notes (Signed)
Dr. Frederico Hamman T. Bacilio Abascal, MD, [facility] [place], [address] Phone: [phone] Fax: [phone]  09/16/2017  Patient: Richard Grimes, MRN: [id number], DOB: [birth year], 67 y.o.  Primary Physician:  [name], MD   Chief Complaint  Patient presents with  . Follow-up    CT results   Subjective:   Richard Grimes is a 67 y.o. very pleasant male patient who presents with the following:  CAD / f/u CT:  Ct Chest High Resolution  Result Date: 09/05/2017 CLINICAL DATA:  67 year old male with family history of interstitial lung disease and personal history of COPD. Evaluate for interstitial lung disease. EXAM: CT CHEST WITHOUT CONTRAST TECHNIQUE: Multidetector CT imaging of the chest was performed following the standard protocol without intravenous contrast. High resolution imaging of the lungs, as well as inspiratory and expiratory imaging, was performed. COMPARISON:  None. FINDINGS: Cardiovascular: Heart size is normal. There is aortic atherosclerosis, as well as atherosclerosis of the great vessels of the mediastinum and the coronary arteries, including calcified atherosclerotic plaque in the left anterior descending and left circumflex coronary arteries. Calcifications of the aortic valve. Mediastinum/Nodes: No pathologically enlarged mediastinal or hilar lymph nodes. Please note that accurate exclusion of hilar adenopathy is limited on noncontrast CT scans. Esophagus is unremarkable in appearance. No axillary lymphadenopathy. Lungs/Pleura: Diffuse bronchial wall thickening with mild to moderate centrilobular emphysema. High-resolution images demonstrate some patchy areas of architectural distortion and mild thickening of the peribronchovascular interstitium with some very mild cylindrical bronchiectasis most evident throughout the lung bases. No significant regions of ground-glass attenuation. No honeycombing. Inspiratory and  expiratory imaging demonstrates some mild air trapping indicative of small airways disease. No acute consolidative airspace disease. No pleural effusions. 7 x 6 mm nodule in the anterior aspect of the right lower lobe (axial image 101 of series 3). Several other scattered 2-4 mm pulmonary nodules are noted elsewhere in the lungs bilaterally, nonspecific. Upper Abdomen: 10 mm calcified gallstone in the fundus of the gallbladder. Aortic atherosclerosis. Musculoskeletal: There are no aggressive appearing lytic or blastic lesions noted in the visualized portions of the skeleton. IMPRESSION: 1. Although there are some mild fibrotic changes in the extreme lung bases, the pattern of these changes is highly nonspecific. This could simply reflect areas of scarring from prior infections or inflammation. The possibility of interstitial lung disease is not excluded, but is not strongly favored at this time. If there is persistent clinical concern for interstitial lung disease, repeat high-resolution chest CT should be obtained in 12 months to assess for temporal changes in the appearance of the lung parenchyma. 2. Aortic atherosclerosis, in addition to 2 vessel coronary artery disease. Please note that although the presence of coronary artery calcium documents the presence of coronary artery disease, the severity of this disease and any potential stenosis cannot be assessed on this non-gated CT examination. Assessment for potential risk factor modification, dietary therapy or pharmacologic therapy may be warranted, if clinically indicated. 3. Mild diffuse bronchial wall thickening with mild to moderate centrilobular emphysema; imaging findings compatible with reported clinical history of COPD. 4. There are calcifications of the aortic valve. Echocardiographic correlation for evaluation of potential valvular dysfunction may be warranted if clinically indicated. Aortic Atherosclerosis (ICD10-I70.0) and Emphysema (ICD10-J43.9).  Electronically Signed   By: Vinnie Langton M.D.   On: 09/05/2017 10:15    >25 minutes spent in face to face time with patient, >50% spent in counselling or coordination of care:  Long conversation with Richard Grimes and his wife about his CT, done for interstitial fibrosis work-up by Pulm.   1. He does have 2 vessel CAD based on CT. This is significant and not known until the CT. He has multiple risk factors, and I am going to have him see cardiology. Start ASA 81 mg now.  2. Aortic calcification, good lipid and BP control.  3. Centrolobular emphysema, known.  I answered all of his questions. Recommended holding off viagra until cardiac evaluation.  Orders Placed This Encounter  Procedures  . Pneumococcal polysaccharide vaccine 23-valent greater than or equal to 2yo subcutaneous/IM  . Ambulatory referral to Cardiology    Signed,  Frederico Hamman T. Micheil Klaus, MD   Allergies as of 09/16/2017      Reactions   Penicillins Rash      Medication List        Accurate as of 09/16/17  2:05 PM. Always use your most recent med list.          FLUoxetine 20 MG capsule Commonly known as:  PROZAC TAKE 1 CAPSULE BY MOUTH ONCE DAILY   Fluticasone-Umeclidin-Vilant 100-62.5-25 MCG/INH Aepb Commonly known as:  TRELEGY ELLIPTA Inhale 1 puff into the lungs daily.   omeprazole 20 MG capsule Commonly known as:  PRILOSEC Take 1 capsule (20 mg total) by mouth 2 (two) times daily before a meal.   PROVENTIL HFA 108 (90 Base) MCG/ACT inhaler Generic drug:  albuterol INHALE TWO PUFFS BY MOUTH EVERY 6 HOURS AS NEEDED FOR WHEEZING OR  SHORTNESS  OF  BREATH   triamcinolone cream 0.1 % Commonly known as:  KENALOG Apply 1 application topically 2 (two) times daily.

## 2017-09-18 ENCOUNTER — Ambulatory Visit: Payer: Medicare Other | Admitting: Cardiovascular Disease

## 2017-09-18 ENCOUNTER — Encounter: Payer: Self-pay | Admitting: Cardiovascular Disease

## 2017-09-18 VITALS — BP 130/92 | HR 79 | Ht 67.0 in | Wt 219.2 lb

## 2017-09-18 DIAGNOSIS — J431 Panlobular emphysema: Secondary | ICD-10-CM | POA: Diagnosis not present

## 2017-09-18 DIAGNOSIS — E662 Morbid (severe) obesity with alveolar hypoventilation: Secondary | ICD-10-CM | POA: Diagnosis not present

## 2017-09-18 DIAGNOSIS — I2510 Atherosclerotic heart disease of native coronary artery without angina pectoris: Secondary | ICD-10-CM | POA: Diagnosis not present

## 2017-09-18 DIAGNOSIS — F17200 Nicotine dependence, unspecified, uncomplicated: Secondary | ICD-10-CM | POA: Diagnosis not present

## 2017-09-18 DIAGNOSIS — I700 Atherosclerosis of aorta: Secondary | ICD-10-CM

## 2017-09-18 MED ORDER — ATORVASTATIN CALCIUM 10 MG PO TABS: 10.0000 mg | ORAL_TABLET | Freq: Every day | ORAL | 3 refills | Status: DC

## 2017-09-18 NOTE — Patient Instructions (Addendum)
Medication Instructions:   Please start atorvastatin one a day for cholesterol  Labwork:  No new labs needed  Testing/Procedures:  No further testing at this time   Follow-Up: It was a pleasure seeing you in the office today. Please call us if you have new issues that need to be addressed before your next appt.  [phone]  Your physician wants you to follow-up in:  As needed  If you need a refill on your cardiac medications before your next appointment, please call your pharmacy.  For educational health videos Log in to : [email or web address] Or : [email or web address], password : [email or web address]

## 2017-09-23 ENCOUNTER — Ambulatory Visit (INDEPENDENT_AMBULATORY_CARE_PROVIDER_SITE_OTHER): Payer: Medicare Other | Admitting: Pulmonary Disease

## 2017-09-23 ENCOUNTER — Ambulatory Visit: Payer: Medicare Other | Admitting: Pulmonary Disease

## 2017-09-23 ENCOUNTER — Other Ambulatory Visit: Payer: Medicare Other

## 2017-09-23 ENCOUNTER — Encounter: Payer: Self-pay | Admitting: Pulmonary Disease

## 2017-09-23 VITALS — BP 142/74 | HR 75 | Ht 67.5 in | Wt 221.0 lb

## 2017-09-23 DIAGNOSIS — J439 Emphysema, unspecified: Secondary | ICD-10-CM | POA: Diagnosis not present

## 2017-09-23 DIAGNOSIS — R0602 Shortness of breath: Secondary | ICD-10-CM | POA: Diagnosis not present

## 2017-09-23 LAB — PULMONARY FUNCTION TEST
DL/VA % pred: 56 %
DL/VA: 2.5 ml/min/mmHg/L
DLCO UNC: 16.04 ml/min/mmHg
DLCO unc % pred: 55 %
FEF 25-75 POST: 0.86 L/s
FEF 25-75 Pre: 0.79 L/sec
FEF2575-%Change-Post: 8 %
FEF2575-%Pred-Post: 34 %
FEF2575-%Pred-Pre: 31 %
FEV1-%Change-Post: 7 %
FEV1-%PRED-POST: 63 %
FEV1-%PRED-PRE: 59 %
FEV1-POST: 1.97 L
FEV1-PRE: 1.84 L
FEV1FVC-%Change-Post: 7 %
FEV1FVC-%PRED-PRE: 65 %
FEV6-%Change-Post: 0 %
FEV6-%PRED-POST: 91 %
FEV6-%PRED-PRE: 91 %
FEV6-POST: 3.62 L
FEV6-Pre: 3.63 L
FEV6FVC-%CHANGE-POST: 0 %
FEV6FVC-%PRED-POST: 101 %
FEV6FVC-%Pred-Pre: 101 %
FVC-%Change-Post: 0 %
FVC-%Pred-Post: 89 %
FVC-%Pred-Pre: 90 %
FVC-POST: 3.74 L
FVC-Pre: 3.77 L
POST FEV6/FVC RATIO: 97 %
PRE FEV6/FVC RATIO: 96 %
Post FEV1/FVC ratio: 53 %
Pre FEV1/FVC ratio: 49 %
RV % PRED: 120 %
RV: 2.65 L
TLC % PRED: 108 %
TLC: 7.08 L

## 2017-09-23 NOTE — Patient Instructions (Signed)
We will check your oxygen levels on exertion We will check blood test for alpha-1 antitrypsin levels and phenotype today Continue trelegy inhaler Your CT scan does not show any evidence of lung fibrosis.  There is some plaque in the blood vessels of the heart and a tiny pulmonary nodule that we will follow-up in 6 months with CT scan Follow-up in clinic in 6 months after CT scan.

## 2017-09-23 NOTE — Progress Notes (Signed)
Richard Grimes    [id number]    05/25/1951  Primary Care Physician:Copland, Frederico Hamman, MD  Referring Physician: [name], MD [place], [place] [address]  Chief complaint: Follow-up for COPD, r/o  interstitial lung disease  HPI: 67 year old with history of asthma as a child, COPD.  His mother is my patient with recent diagnosis of IPF.  He is here at the advice of her mother to get his lungs checked out as well. Diagnosed with COPD in 2010 with mild symptoms of dyspnea on exertion.  He has productive cough with white mucus, denies any fevers, chills.  He is currently maintained on Dulera and Spiriva with about 1 exacerbation every year.  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  Previous exposure to chemicals including fumes, polymers.  Denies any asbestos, Architect exposure.  No mold exposure, no hot tubs. Smoking history: 70-pack-year smoking history.  Quit in 2004. Travel History: No recent travel.  Interim history: He was switched from [facility] and Spiriva to trelegy feels this is working better He has seen Dr. Rockey Situ cardiologist for CT findings of coronary atherosclerosis Working on diet, weight loss and exercise with good results.  He is here to discuss CT which was done to evaluate interstitial lung disease and PFTs  Outpatient Encounter Medications as of 09/23/2017  Medication Sig  . aspirin 81 MG tablet Take 81 mg by mouth daily.  Marland Kitchen atorvastatin (LIPITOR) 10 MG tablet Take 1 tablet (10 mg total) by mouth daily.  Marland Kitchen FLUoxetine (PROZAC) 20 MG capsule TAKE 1 CAPSULE BY MOUTH ONCE DAILY  . Fluticasone-Umeclidin-Vilant (TRELEGY ELLIPTA) 100-62.5-25 MCG/INH AEPB Inhale 1 puff into the lungs daily.  Marland Kitchen omeprazole (PRILOSEC) 20 MG capsule Take 1 capsule (20 mg total) by mouth 2 (two) times daily before a meal.  . PROVENTIL HFA 108 (90 Base) MCG/ACT inhaler  INHALE TWO PUFFS BY MOUTH EVERY 6 HOURS AS NEEDED FOR WHEEZING OR  SHORTNESS  OF  BREATH  . triamcinolone cream (KENALOG) 0.1 % Apply 1 application topically 2 (two) times daily.   Facility-Administered Encounter Medications as of 09/23/2017  Medication  . 0.9 %  sodium chloride infusion    Allergies as of 09/23/2017 - Review Complete 09/23/2017  Allergen Reaction Noted  . Penicillins Rash 01/09/2013    Past Medical History:  Diagnosis Date  . Allergic rhinitis due to pollen 07/18/2011  . COPD (chronic obstructive pulmonary disease) (HCC)    Severe, based on Spirometry.   . Depression   . GERD (gastroesophageal reflux disease)   . Iron deficiency anemia     Past Surgical History:  Procedure Laterality Date  . CARDIAC CATHETERIZATION  2000   Normal coronaries  . NASAL POLYP SURGERY      Family History  Problem Relation Age of Onset  . Pulmonary fibrosis Mother   . GER disease Mother   . Hypertension Mother   . Hypertension Father   . Deep vein thrombosis Maternal Grandfather   . Deep vein thrombosis Paternal Grandmother   . Deep vein thrombosis Paternal Grandfather     Social History   Socioeconomic History  . Marital status: Married    Spouse name: [name]  . Number of children: Not on file  . Years of education: Not on file  . Highest education level: Not on file  Occupational History  . Occupation: Theatre manager:  [employer]  Social Needs  . Financial resource strain: Not on file  . Food insecurity:    Worry: Not on file    Inability: Not on file  . Transportation needs:    Medical: Not on file    Non-medical: Not on file  Tobacco Use  . Smoking status: Former Smoker    Packs/day: 2.00    Years: 35.00    Pack years: 70.00    Types: Cigarettes    Last attempt to quit: 07/02/2004    Years since quitting: 13.2  . Smokeless tobacco: Never Used  Substance and Sexual Activity  . Alcohol use: Yes    Alcohol/week: 0.0 oz    Comment: occ  glass on wine  . Drug use: No  . Sexual activity: Yes    Partners: Female  Lifestyle  . Physical activity:    Days per week: Not on file    Minutes per session: Not on file  . Stress: Not on file  Relationships  . Social connections:    Talks on phone: Not on file    Gets together: Not on file    Attends religious service: Not on file    Active member of club or organization: Not on file    Attends meetings of clubs or organizations: Not on file    Relationship status: Not on file  . Intimate partner violence:    Fear of current or ex partner: Not on file    Emotionally abused: Not on file    Physically abused: Not on file    Forced sexual activity: Not on file  Other Topics Concern  . Not on file  Social History Narrative  . Not on file   Review of systems: Review of Systems  Constitutional: Negative for fever and chills.  HENT: Negative.   Eyes: Negative for blurred vision.  Respiratory: as per HPI  Cardiovascular: Negative for chest pain and palpitations.  Gastrointestinal: Negative for vomiting, diarrhea, blood per rectum. Genitourinary: Negative for dysuria, urgency, frequency and hematuria.  Musculoskeletal: Negative for myalgias, back pain and joint pain.  Skin: Negative for itching and rash.  Neurological: Negative for dizziness, tremors, focal weakness, seizures and loss of consciousness.  Endo/Heme/Allergies: Negative for environmental allergies.  Psychiatric/Behavioral: Negative for depression, suicidal ideas and hallucinations.  All other systems reviewed and are negative.  Physical Exam: Blood pressure (!) 142/74, pulse 75, height 5' 7.5" (1.715 m), weight 221 lb (100.2 kg), SpO2 95 %. Gen:      No acute distress HEENT:  EOMI, sclera anicteric Neck:     No masses; no thyromegaly Lungs:    Clear to auscultation bilaterally; normal respiratory effort CV:         Regular rate and rhythm; no murmurs Abd:      + bowel sounds; soft, non-tender; no palpable  masses, no distension Ext:    No edema; adequate peripheral perfusion Skin:      Warm and dry; no rash Neuro: alert and oriented x 3 Psych: normal mood and affect  Data Reviewed: Chest x-ray 07/27/15-left basilar atelectasis Chest x-ray 08/30/15-hyperinflation.  No lung infiltrate or nodule  High-resolution CT 09/04/17-nonspecific basal reticulation.  Aortic, coronary atherosclerosis.  Mild to moderate emphysema.  7 mm right lower lobe nodule. I have reviewed the images personally.  PFTs 09/23/17 FVC 3.74 [89%], FEV1 1.97 [63%], F/F 53, TLC 108%, DLCO 55% Moderate obstruction and diffusion defect  Assessment:  Moderate COPD He is doing well on trelegy PFTs reviewed with moderate  obstruction. Did not desat on ambulation today He continues to work on diet, weight loss and exercise We discussed pulmonary rehab however he wants to try an exercise program at home by himself first.  He will call us in case he wants a referral to a rehab program at [facility].  SubCm pulmonary nodule Follow up CT in 6 months  R/O ILD Concern for interstitial lung disease as his mother was recently diagnosed with IPF.  High res CT scan reviewed which shows minimal changes that is not concerning for pulmonary fibrosis. This can be monitored on CT scan to be done for nodule follow up  Plan/Recommendations: - Continue trelegy - Follow up CT in 6 months for pulmonary nodule  More then 1/2 the time of the 40 min visit was spent in counseling and/or coordination of care with the patient and family. Marshell Garfinkel MD  Pulmonary and Critical Care Pager [phone] 09/23/2017, 12:10 PM  CC: [name], MD

## 2017-09-23 NOTE — Progress Notes (Signed)
PFT done today. 

## 2017-09-26 LAB — ALPHA-1 ANTITRYPSIN PHENOTYPE: A1 ANTITRYPSIN SER: 143 mg/dL (ref 83–199)

## 2017-10-15 ENCOUNTER — Other Ambulatory Visit: Payer: Self-pay | Admitting: Family Medicine

## 2017-10-15 NOTE — Telephone Encounter (Signed)
Last office visit 09/16/2017.  Last refilled 04/11/2017 for 454 g with 1 refill.  Ok to refill?

## 2017-10-23 ENCOUNTER — Encounter: Payer: Self-pay | Admitting: Gastroenterology

## 2017-10-31 ENCOUNTER — Encounter: Payer: Self-pay | Admitting: Gastroenterology

## 2018-01-16 ENCOUNTER — Other Ambulatory Visit: Payer: Self-pay | Admitting: Family Medicine

## 2018-01-21 ENCOUNTER — Encounter: Payer: Medicare Other | Admitting: Gastroenterology

## 2018-02-22 ENCOUNTER — Other Ambulatory Visit: Payer: Self-pay | Admitting: Pulmonary Disease

## 2018-02-27 ENCOUNTER — Other Ambulatory Visit: Payer: Self-pay | Admitting: Pulmonary Disease

## 2018-02-27 DIAGNOSIS — R911 Solitary pulmonary nodule: Secondary | ICD-10-CM

## 2018-03-17 ENCOUNTER — Inpatient Hospital Stay: Admission: RE | Admit: 2018-03-17 | Payer: Medicare Other | Source: Ambulatory Visit

## 2018-04-16 ENCOUNTER — Encounter: Payer: Self-pay | Admitting: Gastroenterology

## 2018-04-21 ENCOUNTER — Telehealth: Payer: Self-pay | Admitting: Family Medicine

## 2018-04-21 DIAGNOSIS — Z13220 Encounter for screening for lipoid disorders: Secondary | ICD-10-CM

## 2018-04-21 DIAGNOSIS — Z125 Encounter for screening for malignant neoplasm of prostate: Secondary | ICD-10-CM

## 2018-04-21 NOTE — Telephone Encounter (Signed)
-----   Message from Ellamae Sia sent at 04/15/2018  3:45 PM EDT ----- Regarding: Lab orders for Wednesday, 10.23.19 Patient is scheduled for CPX labs, please order future labs, Thanks , Karna Christmas

## 2018-04-22 ENCOUNTER — Telehealth: Payer: Self-pay | Admitting: Pulmonary Disease

## 2018-04-22 ENCOUNTER — Ambulatory Visit
Admission: RE | Admit: 2018-04-22 | Discharge: 2018-04-22 | Disposition: A | Discharge status: Home / Self Care | Payer: Medicare Other | Source: Ambulatory Visit | Attending: Pulmonary Disease | Admitting: Pulmonary Disease

## 2018-04-22 DIAGNOSIS — R918 Other nonspecific abnormal finding of lung field: Secondary | ICD-10-CM | POA: Diagnosis not present

## 2018-04-22 DIAGNOSIS — I700 Atherosclerosis of aorta: Secondary | ICD-10-CM | POA: Diagnosis not present

## 2018-04-22 DIAGNOSIS — R911 Solitary pulmonary nodule: Secondary | ICD-10-CM | POA: Insufficient documentation

## 2018-04-22 DIAGNOSIS — I2510 Atherosclerotic heart disease of native coronary artery without angina pectoris: Secondary | ICD-10-CM | POA: Diagnosis not present

## 2018-04-22 DIAGNOSIS — J432 Centrilobular emphysema: Secondary | ICD-10-CM | POA: Diagnosis not present

## 2018-04-22 NOTE — Telephone Encounter (Signed)
Received a call report from [facility] for a CT scan that the patient had today.   IMPRESSION: 1. Interval growth of irregular left upper lobe solid pulmonary nodule, now 0.8 cm. Primary bronchogenic carcinoma cannot be excluded in this high risk patient. Follow-up chest CT advised in 3 months. PET-CT could be considered at this time, although this nodule is at the lower limits of PET resolution. 2. Several new pulmonary nodules scattered in both lungs, largest 0.8 cm in the left upper lobe. Recommend attention on follow-up chest CT in 3 months. 3. Additional previously visualized scattered pulmonary nodules are stable since 09/04/2017 chest CT. 4. No thoracic adenopathy. 5. Moderate to severe centrilobular emphysema with mild diffuse bronchial wall thickening, compatible with the provided history of COPD. 6. Two vessel coronary atherosclerosis.  Radiologist is concerned about the first impression. Advised that I would let Dr. Vaughan Browner know about this.   Dr. Vaughan Browner, please advise. Thanks!

## 2018-04-23 ENCOUNTER — Other Ambulatory Visit (INDEPENDENT_AMBULATORY_CARE_PROVIDER_SITE_OTHER): Payer: Medicare Other

## 2018-04-23 DIAGNOSIS — Z125 Encounter for screening for malignant neoplasm of prostate: Secondary | ICD-10-CM | POA: Diagnosis not present

## 2018-04-23 DIAGNOSIS — Z13220 Encounter for screening for lipoid disorders: Secondary | ICD-10-CM | POA: Diagnosis not present

## 2018-04-23 LAB — LIPID PANEL
CHOLESTEROL: 126 mg/dL (ref 0–200)
HDL: 47.9 mg/dL (ref >39.00)
LDL CALC: 54 mg/dL (ref 0–99)
NonHDL: 77.74
TRIGLYCERIDES: 121 mg/dL (ref 0.0–149.0)
Total CHOL/HDL Ratio: 3
VLDL: 24.2 mg/dL (ref 0.0–40.0)

## 2018-04-23 LAB — COMPREHENSIVE METABOLIC PANEL
ALT: 20 U/L (ref 0–53)
AST: 15 U/L (ref 0–37)
Albumin: 4.2 g/dL (ref 3.5–5.2)
Alkaline Phosphatase: 122 U/L — ABNORMAL HIGH (ref 39–117)
BUN: 15 mg/dL (ref 6–23)
CO2: 30 mEq/L (ref 19–32)
Calcium: 9.5 mg/dL (ref 8.4–10.5)
Chloride: 102 mEq/L (ref 96–112)
Creatinine, Ser: 1.01 mg/dL (ref 0.40–1.50)
GFR: 78.65 mL/min (ref >60.00)
Glucose, Bld: 105 mg/dL — ABNORMAL HIGH (ref 70–99)
Potassium: 4.6 mEq/L (ref 3.5–5.1)
Sodium: 139 mEq/L (ref 135–145)
Total Bilirubin: 0.5 mg/dL (ref 0.2–1.2)
Total Protein: 6.7 g/dL (ref 6.0–8.3)

## 2018-04-23 LAB — PSA, MEDICARE: PSA: 0.67 ng/ml (ref 0.10–4.00)

## 2018-04-24 ENCOUNTER — Telehealth: Payer: Self-pay | Admitting: Pulmonary Disease

## 2018-04-24 NOTE — Telephone Encounter (Signed)
Dr Vaughan Browner pt is calling you back

## 2018-04-24 NOTE — Telephone Encounter (Signed)
Pt is calling back [phone]

## 2018-04-24 NOTE — Telephone Encounter (Signed)
Megan, CT, is returning call. Cb is [phone].

## 2018-04-24 NOTE — Telephone Encounter (Signed)
Spoke with Jinny Blossom from [facility] who stated they are able to make patient's CT into a super D disc for [place]. She will check with her director to see if they can send the disc to [place] via interoffice mail.  I am going to route this back to Dr. Vaughan Browner due to pt calling back in regards to the results.

## 2018-04-24 NOTE — Telephone Encounter (Signed)
I called pt and left a voice mail requesting call back. We will need to discuss with him when he calls back before ordering PET scan   Please call radiology and get super D disc made of the CT scan on 10/22 in case he needs a biopsy. Thanks  Marshell Garfinkel MD [facility] 04/24/2018, 10:38 AM

## 2018-04-24 NOTE — Telephone Encounter (Signed)
Sending message to [facility] for her to review due to still not hearing anything from Dr. Judy Pimple, please advise on the call report that we received 04/22/18. Thanks!

## 2018-04-24 NOTE — Telephone Encounter (Signed)
Spoke with Jinny Blossom at [facility] ct  I inquired about CT Chest being made into super D disc  She will check on this and call back  Will continue to hold in triage

## 2018-04-24 NOTE — Telephone Encounter (Signed)
Error

## 2018-04-25 ENCOUNTER — Telehealth: Payer: Self-pay | Admitting: Pulmonary Disease

## 2018-04-25 NOTE — Telephone Encounter (Signed)
Called pt and was able to speak with him. Pt apologized and stated the ringer had been cut off on his phone and that was why he had been missing all of our phone calls even the one from Dr. Vaughan Browner.  I stated to pt that Dr. Vaughan Browner stated he would go over results of ct at upcoming OV with him. Pt expressed understanding. Nothing further needed.

## 2018-04-25 NOTE — Telephone Encounter (Signed)
Patient returning phone call.  Phone number is [phone].

## 2018-04-25 NOTE — Telephone Encounter (Signed)
I called his phone number multiple times over the week and got the voice mail.  We can discuss CT scan when he comes to scheduled clinic follow up on 10/28

## 2018-04-25 NOTE — Telephone Encounter (Signed)
Attempted to call pt but no answer. Left message for pt to return call. 

## 2018-04-25 NOTE — Telephone Encounter (Signed)
Per previous phone message that had been closed, Dr. Vaughan Browner had been trying to reach out to pt to go over the results of his CT scan with him. Due to Dr. Vaughan Browner not able to reach pt after multiple attempts trying to call him, Dr. Vaughan Browner said that he would discuss results of pt's CT at [place] 04/28/18.  Attempted to call pt but unable to reach him. Left a message for pt to return call x1

## 2018-04-25 NOTE — Telephone Encounter (Signed)
Patient is returning call. CB is [phone].

## 2018-04-25 NOTE — Telephone Encounter (Signed)
Noted.  Will close encounter.  

## 2018-04-28 ENCOUNTER — Telehealth: Payer: Self-pay | Admitting: Pulmonary Disease

## 2018-04-28 ENCOUNTER — Encounter: Payer: BLUE CROSS/BLUE SHIELD | Admitting: Family Medicine

## 2018-04-28 ENCOUNTER — Ambulatory Visit: Payer: Medicare Other | Admitting: Pulmonary Disease

## 2018-04-28 ENCOUNTER — Encounter: Payer: Self-pay | Admitting: Pulmonary Disease

## 2018-04-28 VITALS — BP 140/88 | HR 77 | Ht 67.5 in | Wt 213.2 lb

## 2018-04-28 DIAGNOSIS — R911 Solitary pulmonary nodule: Secondary | ICD-10-CM | POA: Diagnosis not present

## 2018-04-28 DIAGNOSIS — J439 Emphysema, unspecified: Secondary | ICD-10-CM

## 2018-04-28 NOTE — Telephone Encounter (Signed)
Called and spoke with Ria Comment in regards to [place] requesting the Disc of pt's CT scan. Per Ria Comment, we either need to send a fax request to [phone] or we could call the front desk at [phone] and they could handle the request.  Called Radiology and spoke with  Butch Penny at the front desk in regards to [place] requesting a disc of pt's CT scan. Per Butch Penny, we could see the scan in pacs system. I stated to her that we have been trying to get the disc of pt's CT made into a super D disc. Butch Penny routed me back to [facility] stated to me that we would have to send a fax in regards to the request for the disc.  I have faxed the request to [facility] CT so we can be able to have the disc mailed to our office.  Routing to Dr. Vaughan Browner as an Juluis Rainier so he knows we are trying to get the disc of pt's scan.

## 2018-04-28 NOTE — Patient Instructions (Signed)
We will check a blood test called [facility]  to further evaluate the lung nodule We will order PET scan Follow-up in 1 month to review results.

## 2018-04-28 NOTE — Telephone Encounter (Signed)
[facility] calling stating recd fax for CT request.  She wants to be sure we are aware it will take 7-10 business days if they send this.  States that disc can be burned at [facility] if needed and we can get it there.  CB is [phone].

## 2018-04-28 NOTE — Telephone Encounter (Signed)
7-10 business days is too long of a turnaround time for this disk. Called CT, states that [facility] can burn the disk and have it ready for pickup today.   Forwarding to Dr. Vaughan Browner to make aware.

## 2018-04-28 NOTE — Progress Notes (Signed)
Richard Grimes    [id number]    1951-01-12  Primary Care Physician:Copland, Frederico Hamman, MD  Referring Physician: [name], MD [place], [place] [address]  Chief complaint: Follow-up for COPD, r/o  interstitial lung disease  HPI: 67 year old with history of asthma as a child, COPD.  His mother is my patient with recent diagnosis of IPF.  He is here at the advice of her mother to get his lungs checked out as well. Diagnosed with COPD in 2010 with mild symptoms of dyspnea on exertion.  He has productive cough with white mucus, denies any fevers, chills.  He is currently maintained on Dulera and Spiriva with about 1 exacerbation every year.  Switch to Trelegy in 2019 with improvement in symptoms He has seen Dr. 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  Previous exposure to chemicals including fumes, polymers.  Denies any asbestos, Architect exposure.  No mold exposure, no hot tubs. Smoking history: 70-pack-year smoking history.  Quit in 2004. Travel History: No recent travel.  Interim history: Continues on trelegy with no issues.  He has chronic dyspnea on exertion, occasional cough.  No wheezing, fevers, chills. He is here to discuss his follow-up CT scan.  Outpatient Encounter Medications as of 04/28/2018  Medication Sig  . aspirin 81 MG tablet Take 81 mg by mouth daily.  Marland Kitchen atorvastatin (LIPITOR) 10 MG tablet Take 1 tablet (10 mg total) by mouth daily.  Marland Kitchen FLUoxetine (PROZAC) 20 MG capsule TAKE 1 CAPSULE BY MOUTH ONCE DAILY  . omeprazole (PRILOSEC) 20 MG capsule Take 1 capsule (20 mg total) by mouth 2 (two) times daily before a meal.  . PROVENTIL HFA 108 (90 Base) MCG/ACT inhaler INHALE TWO PUFFS BY MOUTH EVERY 6 HOURS AS NEEDED FOR WHEEZING OR  SHORTNESS  OF  BREATH  . TRELEGY ELLIPTA  100-62.5-25 MCG/INH AEPB INHALE 1 PUFF ONCE DAILY  . triamcinolone cream (KENALOG) 0.1 %  APPLY TOPICALLY TWICE DAILY   Facility-Administered Encounter Medications as of 04/28/2018  Medication  . 0.9 %  sodium chloride infusion   Physical Exam: Blood pressure 140/88, pulse 77, height 5' 7.5" (1.715 m), weight 213 lb 3.2 oz (96.7 kg), SpO2 94 %. Gen:      No acute distress HEENT:  EOMI, sclera anicteric Neck:     No masses; no thyromegaly Lungs:    Clear to auscultation bilaterally; normal respiratory effort CV:         Regular rate and rhythm; no murmurs Abd:      + bowel sounds; soft, non-tender; no palpable masses, no distension Ext:    No edema; adequate peripheral perfusion Skin:      Warm and dry; no rash Neuro: alert and oriented x 3 Psych: normal mood and affect  Data Reviewed: Chest x-ray 07/27/15-left basilar atelectasis Chest x-ray 08/30/15-hyperinflation.  No lung infiltrate or nodule  High-resolution CT 09/04/17-nonspecific basal reticulation.  Aortic, coronary atherosclerosis.  Mild to moderate emphysema.  7 mm right lower lobe nodule. CT chest 04/22/2018-left upper lobe nodule is enlarged to 8 mm, several new pulmonary nodules in both lungs Moderate emphysema, two-vessel coronary atherosclerosis. I reviewed the images personally.  PFTs 09/23/17 FVC 3.74 [89%], FEV1 1.97 [63%], F/F 53, TLC 108%, DLCO 55% Moderate obstruction and diffusion defect  Assessment:  Moderate COPD Continues on trelegy with no issues  Did not desat on exertion Advised to work on diet, weight loss, exercise We discussed pulmonary rehab however he wants to try an exercise program at home by himself first.  He will call us in case he wants a referral to a rehab program at [facility].  Pulmonary nodule Left upper lobe nodule is now grown to 8 mm which is concerning.   Schedule PET scan although the nodule is at the limit of detection We will also get biodesix nodify test for further  evaluation.  R/O ILD Concern for interstitial lung disease as his mother was recently diagnosed with IPF.  High res CT scan reviewed which shows minimal changes that is not concerning for pulmonary fibrosis. This can be monitored on CT scan to be done for nodule follow up  Plan/Recommendations: - Continue trelegy - PET scan, Biodesix nodify xl2 test  Marshell Garfinkel MD [facility] 04/28/2018, 11:33 AM  CC: [name], MD

## 2018-05-01 ENCOUNTER — Encounter
Admission: RE | Admit: 2018-05-01 | Discharge: 2018-05-01 | Disposition: A | Discharge status: Home / Self Care | Payer: Medicare Other | Source: Ambulatory Visit | Attending: Pulmonary Disease | Admitting: Pulmonary Disease

## 2018-05-01 DIAGNOSIS — R911 Solitary pulmonary nodule: Secondary | ICD-10-CM | POA: Diagnosis not present

## 2018-05-01 DIAGNOSIS — R918 Other nonspecific abnormal finding of lung field: Secondary | ICD-10-CM | POA: Diagnosis not present

## 2018-05-01 LAB — GLUCOSE, CAPILLARY: Glucose-Capillary: 99 mg/dL (ref 70–99)

## 2018-05-01 MED ORDER — FLUDEOXYGLUCOSE F - 18 (FDG) INJECTION
11.0000 | Freq: Once | INTRAVENOUS | Status: AC | PRN
  Administered 2018-05-01: 11.73 via INTRAVENOUS

## 2018-05-09 NOTE — Telephone Encounter (Signed)
Has the CT disk been rec'd?  Routing message to PM and [facility] for update. Thank you.

## 2018-05-12 NOTE — Telephone Encounter (Signed)
Per Dr. Vaughan Browner CT disc is not needed, as PET scan was okay.  Will close encounter, as nothing further is needed.

## 2018-05-12 NOTE — Telephone Encounter (Signed)
Can you see if the follow up CT can be ordered as a super D CT

## 2018-05-13 ENCOUNTER — Ambulatory Visit (AMBULATORY_SURGERY_CENTER): Payer: Self-pay | Admitting: *Deleted

## 2018-05-13 ENCOUNTER — Encounter: Payer: Self-pay | Admitting: Gastroenterology

## 2018-05-13 VITALS — Ht 67.0 in | Wt 214.0 lb

## 2018-05-13 DIAGNOSIS — Z86010 Personal history of colonic polyps: Secondary | ICD-10-CM

## 2018-05-13 MED ORDER — NA SULFATE-K SULFATE-MG SULF 17.5-3.13-1.6 GM/177ML PO SOLN: ORAL | 0 refills | Status: DC

## 2018-05-13 NOTE — Progress Notes (Signed)
Patient denies any allergies to eggs or soy. Patient denies any problems with anesthesia/sedation. Patient denies any oxygen use at home. Patient denies taking any diet/weight loss medications or blood thinners. EMMI education offered, pt declined. 2 day prep given to pt. Suprep $50 coupon given to pt.

## 2018-05-13 NOTE — Telephone Encounter (Addendum)
Please see 05/01/18 result note.

## 2018-05-16 ENCOUNTER — Encounter: Payer: Self-pay | Admitting: Pulmonary Disease

## 2018-05-16 ENCOUNTER — Ambulatory Visit: Payer: Medicare Other | Admitting: Pulmonary Disease

## 2018-05-16 VITALS — BP 144/82 | HR 72 | Ht 67.0 in | Wt 213.0 lb

## 2018-05-16 DIAGNOSIS — R911 Solitary pulmonary nodule: Secondary | ICD-10-CM | POA: Diagnosis not present

## 2018-05-16 NOTE — Patient Instructions (Signed)
The PET scan does not show any uptake which is good news.  However will need to keep a close watch on the lung nodule I will order a follow-up CT in 3 months time We are still awaiting the results of the blood test done at last visit.  I will call and update you with results as soon as I get them Follow-up in 3 months.

## 2018-05-16 NOTE — Progress Notes (Signed)
Richard Grimes    [id number]    [birth year]  Primary Care Physician:Copland, Frederico Hamman, MD  Referring Physician: [name], MD [place],  [id number]  Chief complaint: Follow-up for lung nodule, COPD, r/o  interstitial lung disease  HPI: 67 year old with history of asthma as a child, COPD.  His mother is my patient with recent diagnosis of IPF.  He is here at the advice of her mother to get his lungs checked out as well. Diagnosed with COPD in 2010 with mild symptoms of dyspnea on exertion.  He has productive cough with white mucus, denies any fevers, chills.  He is currently maintained on Dulera and Spiriva with about 1 exacerbation every year.  Switch to Trelegy in 2019 with improvement in symptoms He has seen Dr. 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  Previous exposure to chemicals including fumes, polymers.  Denies any asbestos, Architect exposure.  No mold exposure, no hot tubs. Smoking history: 70-pack-year smoking history.  Quit in 2004. Travel History: No recent travel.  Interim history: Continues on trelegy with no issues He is here for nodules follow-up and review of recent PET scan  Outpatient Encounter Medications as of 05/16/2018  Medication Sig  . aspirin 81 MG tablet Take 81 mg by mouth daily.  Marland Kitchen atorvastatin (LIPITOR) 10 MG tablet Take 1 tablet (10 mg total) by mouth daily.  Marland Kitchen FLUoxetine (PROZAC) 20 MG capsule TAKE 1 CAPSULE BY MOUTH ONCE DAILY  . Na Sulfate-K Sulfate-Mg Sulf 17.5-3.13-1.6 GM/177ML SOLN Suprep (no substitutions)-TAKE AS DIRECTED.  Marland Kitchen omeprazole (PRILOSEC) 20 MG capsule Take 1 capsule (20 mg total) by mouth 2 (two) times daily before a meal.  . PROVENTIL HFA 108 (90 Base) MCG/ACT inhaler INHALE TWO PUFFS BY MOUTH EVERY 6 HOURS AS NEEDED FOR WHEEZING OR   SHORTNESS  OF  BREATH  . TRELEGY ELLIPTA 100-62.5-25 MCG/INH AEPB INHALE 1 PUFF ONCE DAILY  . triamcinolone cream (KENALOG) 0.1 %  APPLY TOPICALLY TWICE DAILY   No facility-administered encounter medications on file as of 05/16/2018.    Physical Exam: Blood pressure (!) 144/82, pulse 72, height 5\' 7"  (1.702 m), weight 213 lb (96.6 kg), SpO2 94 %. Gen:      No acute distress HEENT:  EOMI, sclera anicteric Neck:     No masses; no thyromegaly Lungs:    Clear to auscultation bilaterally; normal respiratory effort CV:         Regular rate and rhythm; no murmurs Abd:      + bowel sounds; soft, non-tender; no palpable masses, no distension Ext:    No edema; adequate peripheral perfusion Skin:      Warm and dry; no rash Neuro: alert and oriented x 3 Psych: normal mood and affect  Data Reviewed: Imaging Chest x-ray 07/27/15-left basilar atelectasis Chest x-ray 08/30/15-hyperinflation.  No lung infiltrate or nodule  High-resolution CT 09/04/17-nonspecific basal reticulation.  Aortic, coronary atherosclerosis.  Mild to moderate emphysema.  7 mm right lower lobe nodule. CT chest 04/22/2018-left upper lobe nodule is enlarged to 8 mm, several new pulmonary nodules in both lungs Moderate emphysema, two-vessel coronary atherosclerosis. PET scan 05/01/2018- FDG uptake in the lung nodules.  Faint stranding in the mesentery. I reviewed the images personally.  PFTs 09/23/17 FVC 3.74 [89%], FEV1 1.97 [63%], F/F 53, TLC 108%, DLCO 55% Moderate obstruction  and diffusion defect  Assessment:  Moderate COPD Continues on trelegy with no issues Did not desat on exertion Advised to work on diet, weight loss, exercise We discussed pulmonary rehab however he wants to try an exercise program at home by himself first.  He will call us in case he wants a referral to a rehab program at [facility].  Pulmonary nodule Left upper lobe nodule is now grown to 8 mm which is concerning.   PET scan does not show uptake but  this could be due to small size of the lung nodule Follow-up CT without contrast ordered for 3 months time to monitor closely We are also awaiting the result of the biodesix nodify test for further evaluation (blood was drawn on 11/28).  R/O ILD Concern for interstitial lung disease as his mother was recently diagnosed with IPF.  High res CT scan reviewed which shows minimal changes that is not concerning for pulmonary fibrosis. This can be monitored on CT scan to be done for nodule follow up  Plan/Recommendations: - Continue trelegy - 66-month follow-up CT, Biodesix nodify xl2 test   Marshell Garfinkel MD [facility] 05/16/2018, 11:00 AM  CC: [name], MD

## 2018-05-22 ENCOUNTER — Encounter: Payer: Self-pay | Admitting: Pulmonary Disease

## 2018-05-22 NOTE — Progress Notes (Signed)
Biodesix results Nodify XL2 04/28/18 Results reviewed.  Posttest probability of malignancy of 3%. Will be scanned into computer  Marshell Garfinkel MD [facility] 05/22/2018, 3:47 PM

## 2018-05-23 ENCOUNTER — Ambulatory Visit (AMBULATORY_SURGERY_CENTER): Payer: Medicare Other | Admitting: Gastroenterology

## 2018-05-23 ENCOUNTER — Encounter: Payer: Self-pay | Admitting: Gastroenterology

## 2018-05-23 VITALS — BP 118/88 | HR 57 | Temp 98.3°F | Resp 13 | Ht 67.0 in | Wt 214.0 lb

## 2018-05-23 DIAGNOSIS — D124 Benign neoplasm of descending colon: Secondary | ICD-10-CM | POA: Diagnosis not present

## 2018-05-23 DIAGNOSIS — Z1211 Encounter for screening for malignant neoplasm of colon: Secondary | ICD-10-CM | POA: Diagnosis not present

## 2018-05-23 DIAGNOSIS — D122 Benign neoplasm of ascending colon: Secondary | ICD-10-CM

## 2018-05-23 DIAGNOSIS — Z86010 Personal history of colonic polyps: Secondary | ICD-10-CM

## 2018-05-23 DIAGNOSIS — D123 Benign neoplasm of transverse colon: Secondary | ICD-10-CM | POA: Diagnosis not present

## 2018-05-23 DIAGNOSIS — D125 Benign neoplasm of sigmoid colon: Secondary | ICD-10-CM

## 2018-05-23 MED ORDER — SODIUM CHLORIDE 0.9 % IV SOLN: 500.0000 mL | Freq: Once | INTRAVENOUS | Status: DC

## 2018-05-23 NOTE — Progress Notes (Signed)
Called to room to assist during endoscopic procedure.  Patient ID and intended procedure confirmed with present staff. Received instructions for my participation in the procedure from the performing physician.  

## 2018-05-23 NOTE — Patient Instructions (Signed)
Impression/Recommendations:  Polyp handout given to patient.  Resume previous diet. Continue present medications.  Repeat colonoscopy recommended for surveillance.  Date to be determined after pathology results reviewed.  YOU HAD AN ENDOSCOPIC PROCEDURE TODAY AT [facility]:   Refer to the procedure report that was given to you for any specific questions about what was found during the examination.  If the procedure report does not answer your questions, please call your gastroenterologist to clarify.  If you requested that your care partner not be given the details of your procedure findings, then the procedure report has been included in a sealed envelope for you to review at your convenience later.  YOU SHOULD EXPECT: Some feelings of bloating in the abdomen. Passage of more gas than usual.  Walking can help get rid of the air that was put into your GI tract during the procedure and reduce the bloating. If you had a lower endoscopy (such as a colonoscopy or flexible sigmoidoscopy) you may notice spotting of blood in your stool or on the toilet paper. If you underwent a bowel prep for your procedure, you may not have a normal bowel movement for a few days.  Please Note:  You might notice some irritation and congestion in your nose or some drainage.  This is from the oxygen used during your procedure.  There is no need for concern and it should clear up in a day or so.  SYMPTOMS TO REPORT IMMEDIATELY:   Following lower endoscopy (colonoscopy or flexible sigmoidoscopy):  Excessive amounts of blood in the stool  Significant tenderness or worsening of abdominal pains  Swelling of the abdomen that is new, acute  Fever of 100F or higher For urgent or emergent issues, a gastroenterologist can be reached at any hour by calling [phone].   DIET:  We do recommend a small meal at first, but then you may proceed to your regular diet.  Drink plenty of fluids but you should  avoid alcoholic beverages for 24 hours.  ACTIVITY:  You should plan to take it easy for the rest of today and you should NOT DRIVE or use heavy machinery until tomorrow (because of the sedation medicines used during the test).    FOLLOW UP: Our staff will call the number listed on your records the next business day following your procedure to check on you and address any questions or concerns that you may have regarding the information given to you following your procedure. If we do not reach you, we will leave a message.  However, if you are feeling well and you are not experiencing any problems, there is no need to return our call.  We will assume that you have returned to your regular daily activities without incident.  If any biopsies were taken you will be contacted by phone or by letter within the next 1-3 weeks.  Please call us at [phone] if you have not heard about the biopsies in 3 weeks.    SIGNATURES/CONFIDENTIALITY: You and/or your care partner have signed paperwork which will be entered into your electronic medical record.  These signatures attest to the fact that that the information above on your After Visit Summary has been reviewed and is understood.  Full responsibility of the confidentiality of this discharge information lies with you and/or your care-partner.

## 2018-05-23 NOTE — Op Note (Signed)
[facility] Patient Name: Richard Grimes Procedure Date: 05/23/2018 3:10 PM MRN: [id number] Endoscopist: Mallie Mussel L. 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 The patient's tolerance of                            previous anesthesia was also reviewed. The risks                            and benefits of the procedure and the sedation                            options and risks were discussed with the patient.                            All questions were answered, and informed consent                            was obtained. Anticoagulants: The patient has taken                            aspirin. It was decided not to withhold this                            medication prior to the procedure. ASA Grade                            Assessment: II - A patient with mild systemic                            disease. After reviewing the risks and benefits,  the patient was deemed in satisfactory condition to                            undergo the procedure.                           After obtaining informed consent, the colonoscope                            was passed under direct vision. Throughout the                            procedure, the patient's blood pressure, pulse, and                            oxygen saturations were monitored continuously. The   Colonoscope was introduced through the anus and                            advanced to the the cecum, identified by                            appendiceal orifice and ileocecal valve. The                            colonoscopy was performed without difficulty. The                            patient tolerated the procedure well. The quality                            of the bowel preparation was good after lavage.                            2-day prep with Miralax and Suprep. The ileocecal                            valve, appendiceal orifice, and rectum were                            photographed. Scope In: 3:16:19 PM Scope Out: 4:54:09 PM Scope Withdrawal Time: 0 hours 25 minutes 42 seconds  Total Procedure Duration: 0 hours 30 minutes 12 seconds  Findings:                 The perianal and digital rectal examinations were                            normal.                           Five sessile polyps were found in the transverse                            colon and ascending colon. The polyps were 2 to 6  mm in size. Three of these polyps were removed with                            a cold snare, two were removed with cold biopsy                            forceps. Resection was complete, and all but one                            polyp were retrieved.                           Two sessile polyps were found in the descending                            colon. The polyps were 4 to 6 mm in size. These                            polyps were removed with a cold snare. Resection                            and retrieval were complete.                           Three sessile polyps were found in the sigmoid                            colon. The polyps were 4 to 8 mm in size. These                            polyps were removed with a cold snare. Resection                            and retrieval were complete.                           The exam was otherwise without  abnormality on                            direct and retroflexion views. Complications:            No immediate complications. Estimated Blood Loss:     Estimated blood loss was minimal. Impression:               - Five 2 to 6 mm polyps in the transverse colon and                            in the ascending colon, removed with a cold snare                            and cold biopsy. Complete resection. Partial  retrieval.                           - Two 4 to 6 mm polyps in the descending colon,                            removed with a cold snare. Resected and retrieved.                           - Three 4 to 8 mm polyps in the sigmoid colon,                            removed with a cold snare. Resected and retrieved.                           - The examination was otherwise normal on direct                            and retroflexion views. Recommendation:           - Patient has a contact number available for                            emergencies. The signs and symptoms of potential                            delayed complications were discussed with the                            patient. Return to normal activities tomorrow.                            Written discharge instructions were provided to the                            patient.                           - Resume previous diet.                           - Continue present medications.                           - Await pathology results.                           - Repeat colonoscopy is recommended for                            surveillance. The colonoscopy date will be                            determined after pathology results from today's  exam become available for review. Henry L. Loletha Carrow, MD 05/23/2018 3:55:25 PM This report has been signed electronically.

## 2018-05-23 NOTE — Progress Notes (Signed)
PT taken to PACU. Monitors in place. VSS. Report given to RN. 

## 2018-05-26 ENCOUNTER — Telehealth: Payer: Self-pay

## 2018-05-26 ENCOUNTER — Telehealth: Payer: Self-pay | Admitting: *Deleted

## 2018-05-26 NOTE — Telephone Encounter (Signed)
  Follow up Call-  Call back number 05/23/2018 11/01/2016  Post procedure Call Back phone  # [phone]  Permission to leave phone message Yes Yes  Some recent data might be hidden     Patient questions:  Do you have a fever, pain , or abdominal swelling? No. Pain Score  0 *  Have you tolerated food without any problems? Yes.    Have you been able to return to your normal activities? Yes.    Do you have any questions about your discharge instructions: Diet   No. Medications  No. Follow up visit  No.  Do you have questions or concerns about your Care? No.  Actions: * If pain score is 4 or above: No action needed, pain <4.

## 2018-05-26 NOTE — Telephone Encounter (Signed)
No answer. Name identifier. Message left to call if questions or concerns and we would make an attempt to call later in the day.

## 2018-06-02 ENCOUNTER — Encounter: Payer: Self-pay | Admitting: Gastroenterology

## 2018-06-15 NOTE — Progress Notes (Signed)
Dr. Frederico Hamman T. Lucienne Sawyers, MD, [facility] [place], [address] Phone: [phone] Fax: [phone]  06/16/2018  Patient: Richard Grimes, MRN: [id number], DOB: [birth year], 67 y.o.  Primary Physician:  [name], MD   Chief Complaint  Patient presents with  . Welcome to Medicare   Subjective:   Richard Grimes is a 67 y.o. pleasant patient who presents for a Welcome to Medicare Physical:  Preventative Health Maintenance Visit:  Health Maintenance Summary Reviewed and updated, unless pt declines services.  Tobacco History Reviewed. 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  Cards and pulm notes reviewed  Health Maintenance  Topic Date Due  . INFLUENZA VACCINE  07/10/2018 (Originally 01/30/2018)  . Fecal DNA (Cologuard)  05/15/2019  . COLON CANCER SCREENING ANNUAL FOBT  05/24/2019  . TETANUS/TDAP  04/23/2026  . Hepatitis C Screening  Completed  . HIV Screening  Completed  . PNA vac Low Risk Adult  Completed    Immunization History  Administered Date(s) Administered  . Influenza Whole 05/24/2009  . Influenza,inj,Quad PF,6+ Mos 04/24/2017  . Pneumococcal Conjugate-13 01/20/2014  . Pneumococcal Polysaccharide-23 01/04/2011, 09/16/2017  . Tdap 04/23/2016  . Zoster 01/20/2014    Patient Active Problem List   Diagnosis Date Noted  . Coronary artery calcification seen on CT scan 09/18/2017  . 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 Aortic atherosclerosis ([place]) 09/16/2017  . Allergic rhinitis due to  pollen 07/18/2011  . DEPRESSION 05/24/2009  . COPD (chronic obstructive pulmonary disease) with emphysema ([place]) 05/24/2009  . GERD 05/24/2009   Past Medical History:  Diagnosis Date  . Allergic rhinitis due to pollen 07/18/2011  . COPD (chronic obstructive pulmonary disease) ([facility])    Severe, based on Spirometry.   . Depression   . GERD (gastroesophageal reflux disease)   . Iron deficiency anemia    Past Surgical History:  Procedure Laterality Date  . CARDIAC CATHETERIZATION  2000   Normal coronaries  . COLONOSCOPY    . NASAL POLYP SURGERY    . POLYPECTOMY    . UPPER GASTROINTESTINAL ENDOSCOPY     Social History   Socioeconomic History  . Marital status: Married    Spouse name: [name]  . Number of children: Not on file  . Years of education: Not on file  . Highest education level: Not on file  Occupational History  . Occupation: fumigation    Employer: [employer]  . Financial resource strain: Not on file  . Food insecurity:    Worry: Not on file    Inability: Not on file  . Transportation needs:    Medical: Not on file    Non-medical: Not on file  Tobacco Use  . Smoking status: Former Smoker    Packs/day: 2.00    Years: 35.00    Pack years: 70.00    Types: Cigarettes    Last attempt to quit: 07/02/2004    Years since quitting: 13.9  . Smokeless tobacco: Never Used  Substance and Sexual Activity  . Alcohol use: Yes    Alcohol/week: 0.0 standard drinks    Comment: occ glass on wine  . Drug use: No  . Sexual activity: Yes    Partners: Female  Lifestyle  . Physical activity:    Days per week: Not on file    Minutes per session: Not on file  . Stress: Not on file  Relationships  . Social connections:    Talks on phone: Not on file    Gets together: Not on file    Attends religious service: Not on file    Active member of club or organization: Not on file    Attends meetings of clubs or organizations: Not on file    Relationship  status: Not on file  . Intimate partner violence:    Fear of current or ex partner: Not on file    Emotionally abused: Not on file    Physically abused: Not on file    Forced sexual activity: Not on file  Other Topics Concern  . Not on file  Social History Narrative  . Not on file   Family History  Problem Relation Age of Onset  . Pulmonary fibrosis Mother   . GER disease Mother   . Hypertension Mother   . Colon polyps Mother   . Hypertension Father   . Deep vein thrombosis Maternal Grandfather   . Deep vein thrombosis Paternal Grandmother   . Deep vein thrombosis Paternal Grandfather   . Colon cancer Neg Hx   . Esophageal cancer Neg Hx   . Rectal cancer Neg Hx   . Stomach cancer Neg Hx    Allergies  Allergen Reactions  . Penicillins Rash    Medication list has been reviewed and updated.   General: Denies fever, chills, sweats. No significant weight loss. Eyes: Denies blurring,significant itching ENT: Denies earache, sore throat, and hoarseness. Cardiovascular: Denies chest pains, palpitations, dyspnea on exertion Respiratory: Denies cough, dyspnea at rest,wheeezing Breast: no concerns about lumps GI: Denies nausea, vomiting, diarrhea, constipation, change in bowel habits, abdominal pain, melena, hematochezia GU: Denies penile discharge, ED, urinary flow / outflow problems. No STD concerns. Musculoskeletal: Denies back pain, joint pain Derm: Denies rash, itching Neuro: Denies  paresthesias, frequent falls, frequent headaches Psych: Denies depression, anxiety Endocrine: Denies cold intolerance, heat intolerance, polydipsia Heme: Denies enlarged lymph nodes Allergy: No hayfever  Objective:   BP 140/90   Pulse 68   Temp 97.9 F (36.6 C) (Oral)   Ht '5\' 7"'  (1.702 m)   Wt 201 lb 8 oz (91.4 kg)   BMI 31.56 kg/m    Weight in (lb) to have BMI = 25: 159.3   The patient completed a fall screen and PHQ-2 and PHQ-9 if necessary, which is documented in the EHR. The  CMA/LPN/RN who assisted the patient verbally completed with them and documented results in [facility].   Hearing Screening   Method: Audiometry   '125Hz'  '250Hz'  '500Hz'  '1000Hz'  '2000Hz'  '3000Hz'  '4000Hz'  '6000Hz'  '8000Hz'   Right ear:   '20 20 20  20    ' Left ear:   '20 20 20  ' 0      Visual Acuity Screening   Right eye Left eye Both eyes  Without correction:     With correction: '20/13 20/15 20/13 '    GEN: well developed, well nourished, no acute distress Eyes: conjunctiva and lids normal, PERRLA, EOMI ENT: TM clear, nares clear, oral exam WNL Neck: supple, no lymphadenopathy, no thyromegaly,  no JVD Pulm: clear to auscultation and percussion, respiratory effort normal CV: regular rate and rhythm, S1-S2, no murmur, rub or gallop, no bruits, peripheral pulses normal and symmetric, no cyanosis, clubbing, edema or varicosities GI: soft, non-tender; no hepatosplenomegaly, masses; active bowel sounds all quadrants GU: no hernia, testicular mass, penile discharge Lymph: no cervical, axillary or inguinal adenopathy MSK: gait normal, muscle tone and strength WNL, no joint swelling, effusions, discoloration, crepitus  SKIN: clear, good turgor, color WNL, no rashes, lesions, or ulcerations Neuro: normal mental status, normal strength, sensation, and motion Psych: alert; oriented to person, place and time, normally interactive and not anxious or depressed in appearance.  All labs reviewed with patient.  Lipids: Lab Results  Component Value Date   CHOL 126 04/23/2018   Lab Results  Component Value Date   HDL 47.90 04/23/2018   Lab Results  Component Value Date   LDLCALC 54 04/23/2018   Lab Results  Component Value Date   TRIG 121.0 04/23/2018   Lab Results  Component Value Date   CHOLHDL 3 04/23/2018   CBC: CBC Latest Ref Rng & Units 04/22/2017 12/19/2016 09/10/2016  WBC 4.0 - 10.5 K/uL 7.2 4.7 7.9  Hemoglobin 13.0 - 17.0 g/dL 14.7 13.9 12.5(L)  Hematocrit 39.0 - 52.0 % 45.1 42.6 38.4(L)    Platelets 150.0 - 400.0 K/uL 234.0 262.0 726    Basic Metabolic Panel:    Component Value Date/Time   NA 139 04/23/2018 1032   K 4.6 04/23/2018 1032   CL 102 04/23/2018 1032   CO2 30 04/23/2018 1032   BUN 15 04/23/2018 1032   CREATININE 1.01 04/23/2018 1032   CREATININE 0.75 09/10/2016 1327   GLUCOSE 105 (H) 04/23/2018 1032   CALCIUM 9.5 04/23/2018 1032   Hepatic Function Latest Ref Rng & Units 04/23/2018 04/22/2017 09/10/2016  Total Protein 6.0 - 8.3 g/dL 6.7 6.0 6.5  Albumin 3.5 - 5.2 g/dL 4.2 3.7 3.4(L)  AST 0 - 37 U/L '15 11 12  ' ALT 0 - 53 U/L '20 15 14  ' Alk Phosphatase 39 - 117 U/L 122(H) 99 93  Total Bilirubin 0.2 - 1.2 mg/dL 0.5 0.4 0.5  Bilirubin, Direct 0.0 - 0.3 mg/dL - 0.1 0.5(H)    Lab Results  Component Value Date   TSH 2.02 04/22/2017   Lab Results  Component Value Date   PSA 0.67 04/23/2018   PSA 0.40 04/22/2017   PSA 0.47 04/20/2016    Assessment and Plan:   Healthcare maintenance  Goal 15 pound wt loss Watch BP's  Health Maintenance Exam: The patient's preventative maintenance and recommended screening tests for an annual wellness exam were reviewed in full today. Brought up to date unless services declined.  Counselled on the importance of diet, exercise, and its role in overall health and mortality. The patient's FH and SH was reviewed, including their home life, tobacco status, and drug and alcohol status.  Follow-up in 1 year for physical exam or additional follow-up below.  I have personally reviewed the Medicare Annual Wellness questionnaire and have noted 1. The patient's medical and social history 2. Their use of alcohol, tobacco or illicit drugs 3. Their current medications and supplements 4. The patient's functional ability including ADL's, fall risks, home safety risks and hearing or visual             impairment. 5. Diet and physical activities 6. Evidence for depression or mood disorders 7. Reviewed Updated provider list, see  scanned forms and [facility] Snapshot.   Reviewed  HCPOA and living will - recommended that he discuss with wife and set up docs.  The patients weight, height, BMI and visual acuity have been recorded in the chart I have made referrals, counseling and provided education to the patient based review of the above and I have provided the pt with a written personalized care plan for preventive services.  I have provided the patient with a copy of your personalized plan for preventive services. Instructed to take the time to review along with their updated medication list.  Follow-up: No follow-ups on file. Or follow-up in 1 year if not noted.  Signed,  Maud Deed. Jenet Durio, MD   Allergies as of 06/16/2018      Reactions   Penicillins Rash      Medication List       Accurate as of June 16, 2018  8:23 AM. Always use your most recent med list.        aspirin 81 MG tablet Take 81 mg by mouth daily.   atorvastatin 10 MG tablet Commonly known as:  LIPITOR Take 1 tablet (10 mg total) by mouth daily.   FLUoxetine 20 MG capsule Commonly known as:  PROZAC TAKE 1 CAPSULE BY MOUTH ONCE DAILY   omeprazole 20 MG capsule Commonly known as:  PRILOSEC Take 1 capsule (20 mg total) by mouth 2 (two) times daily before a meal.   PROVENTIL HFA 108 (90 Base) MCG/ACT inhaler Generic drug:  albuterol INHALE TWO PUFFS BY MOUTH EVERY 6 HOURS AS NEEDED FOR WHEEZING OR  SHORTNESS  OF  BREATH   TRELEGY ELLIPTA 100-62.5-25 MCG/INH Aepb Generic drug:  Fluticasone-Umeclidin-Vilant INHALE 1 PUFF ONCE DAILY   triamcinolone cream 0.1 % Commonly known as:  KENALOG APPLY TOPICALLY TWICE DAILY

## 2018-06-16 ENCOUNTER — Encounter: Payer: Self-pay | Admitting: Family Medicine

## 2018-06-16 ENCOUNTER — Ambulatory Visit (INDEPENDENT_AMBULATORY_CARE_PROVIDER_SITE_OTHER): Payer: Medicare Other | Admitting: Family Medicine

## 2018-06-16 VITALS — BP 140/90 | HR 68 | Temp 97.9°F | Ht 67.0 in | Wt 201.5 lb

## 2018-06-16 DIAGNOSIS — Z0000 Encounter for general adult medical examination without abnormal findings: Secondary | ICD-10-CM | POA: Diagnosis not present

## 2018-06-16 DIAGNOSIS — Z23 Encounter for immunization: Secondary | ICD-10-CM | POA: Diagnosis not present

## 2018-06-16 NOTE — Addendum Note (Signed)
Addended by: Carter Kitten on: 06/16/2018 09:25 AM   Modules accepted: Orders

## 2018-07-18 ENCOUNTER — Other Ambulatory Visit: Payer: Self-pay | Admitting: Family Medicine

## 2018-07-21 ENCOUNTER — Other Ambulatory Visit: Payer: Self-pay | Admitting: Pulmonary Disease

## 2018-07-21 DIAGNOSIS — Z0120 Encounter for dental examination and cleaning without abnormal findings: Secondary | ICD-10-CM | POA: Diagnosis not present

## 2018-07-21 DIAGNOSIS — R911 Solitary pulmonary nodule: Secondary | ICD-10-CM

## 2018-07-21 NOTE — Progress Notes (Signed)
ct 

## 2018-07-27 ENCOUNTER — Encounter: Payer: Self-pay | Admitting: Family Medicine

## 2018-07-27 DIAGNOSIS — N486 Induration penis plastica: Secondary | ICD-10-CM

## 2018-08-18 ENCOUNTER — Other Ambulatory Visit: Payer: Medicare Other

## 2018-08-18 ENCOUNTER — Ambulatory Visit
Admission: RE | Admit: 2018-08-18 | Discharge: 2018-08-18 | Disposition: A | Discharge status: Home / Self Care | Payer: Medicare Other | Source: Ambulatory Visit | Attending: Pulmonary Disease | Admitting: Pulmonary Disease

## 2018-08-18 DIAGNOSIS — R911 Solitary pulmonary nodule: Secondary | ICD-10-CM | POA: Diagnosis not present

## 2018-08-18 DIAGNOSIS — R918 Other nonspecific abnormal finding of lung field: Secondary | ICD-10-CM | POA: Diagnosis not present

## 2018-08-19 ENCOUNTER — Ambulatory Visit: Payer: Medicare Other | Admitting: Pulmonary Disease

## 2018-08-19 ENCOUNTER — Encounter: Payer: Self-pay | Admitting: Pulmonary Disease

## 2018-08-19 VITALS — BP 142/90 | HR 74 | Ht 67.0 in | Wt 206.8 lb

## 2018-08-19 DIAGNOSIS — J449 Chronic obstructive pulmonary disease, unspecified: Secondary | ICD-10-CM

## 2018-08-19 DIAGNOSIS — R911 Solitary pulmonary nodule: Secondary | ICD-10-CM

## 2018-08-19 NOTE — Patient Instructions (Signed)
I am glad you are doing well with your breathing Continue the Trelegy inhaler Your CT scan shows improvement in lung nodules which is good news  I will order CT chest without contrast in 1 year for follow-up. Return to clinic in 6 months.

## 2018-08-19 NOTE — Progress Notes (Signed)
Richard Grimes    [id number]    1950-09-07  Primary Care Physician:[place], Frederico Hamman, MD  Referring Physician: [name], MD [place], [place] [address]  Chief complaint: Follow-up for lung nodule, COPD, r/o  interstitial lung disease  HPI: 68 year old with history of asthma as a child, COPD.  His mother is my patient with recent diagnosis of IPF.  He is here at the advice of her mother to get his lungs checked out as well. Diagnosed with COPD in 2010 with mild symptoms of dyspnea on exertion.  He has productive cough with white mucus, denies any fevers, chills.  He is currently maintained on Dulera and Spiriva with about 1 exacerbation every year.  Switch to Trelegy in 2019 with improvement in symptoms He has seen Dr. 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  Previous exposure to chemicals including fumes, polymers.  Denies any asbestos, Architect exposure.  No mold exposure, no hot tubs. Smoking history: 70-pack-year smoking history.  Quit in 2004. Travel History: No recent travel.  Interim history: Continues on Trelegy with no issues Recent episode of acute sinusitis.  He has ordered doxycycline and prednisone from an online pharmacy from [place] and is using them Breathing is stable.  No dyspnea, cough, sputum production.  Outpatient Encounter Medications as of 08/19/2018  Medication Sig  . aspirin 81 MG tablet Take 81 mg by mouth daily.  Marland Kitchen atorvastatin (LIPITOR) 10 MG tablet Take 1 tablet (10 mg total) by mouth daily.  Marland Kitchen FLUoxetine (PROZAC) 20 MG capsule TAKE 1 CAPSULE BY MOUTH ONCE DAILY  . omeprazole (PRILOSEC) 20 MG capsule Take 1 capsule (20 mg total) by mouth 2 (two) times daily before a meal.  . PROVENTIL HFA 108 (90 Base) MCG/ACT inhaler INHALE TWO PUFFS BY MOUTH EVERY 6 HOURS AS  NEEDED FOR WHEEZING OR  SHORTNESS  OF  BREATH  . TRELEGY ELLIPTA 100-62.5-25 MCG/INH AEPB INHALE 1 PUFF ONCE DAILY  . triamcinolone cream (KENALOG) 0.1 %  APPLY TOPICALLY TWICE DAILY   No facility-administered encounter medications on file as of 08/19/2018.    Physical Exam: Blood pressure (!) 142/90, pulse 74, height 5\' 7"  (1.702 m), weight 206 lb 12.8 oz (93.8 kg), SpO2 95 %. Gen:      No acute distress HEENT:  EOMI, sclera anicteric Neck:     No masses; no thyromegaly Lungs:    Clear to auscultation bilaterally; normal respiratory effort CV:         Regular rate and rhythm; no murmurs Abd:      + bowel sounds; soft, non-tender; no palpable masses, no distension Ext:    No edema; adequate peripheral perfusion Skin:      Warm and dry; no rash Neuro: alert and oriented x 3 Psych: normal mood and affect  Data Reviewed: Imaging Chest x-ray 07/27/15-left basilar atelectasis Chest x-ray 08/30/15-hyperinflation.  No lung infiltrate or nodule  High-resolution CT 09/04/17-nonspecific basal reticulation.  Aortic, coronary atherosclerosis.  Mild to moderate emphysema.  7 mm right lower lobe nodule. CT chest 04/22/2018-left upper lobe nodule is enlarged to 8 mm, several new pulmonary nodules in both lungs Moderate emphysema, two-vessel coronary atherosclerosis. PET scan 05/01/2018- FDG uptake in the lung nodules.  Faint stranding in the mesentery. CT chest 08/18/2018- decrease in size and number of previously noted pulmonary nodule.  The  left upper lobe nodule has decreased to 6 mm. I reviewed the images personally.  PFTs 09/23/17 FVC 3.74 [89%], FEV1 1.97 [63%], F/F 53, TLC 108%, DLCO 55% Moderate obstruction and diffusion defect  Biodesix Nodify XL 2 April 28, 2018 Pretest probability 16% risk of malignancy Test result- reduced risk of malignancy.  3% risk of malignancy  Assessment:  Moderate COPD Continues on trelegy with no issues Did not desat on exertion Advised to work on diet,  weight loss, exercise We discussed pulmonary rehab however he wants to try an exercise program at home by himself first.  He will call us in case he wants a referral to a rehab program at [facility].  Pulmonary nodule We are following 8 mm left upper lobe nodule. PET scan does not show uptake he has reduced risk of malignancy on beta 6 test Follow-up CT scan reviewed which showed improvement in lung nodules which is reassuring. Repeat CT scan without contrast in 1 year.  R/O ILD Concern for interstitial lung disease as his mother was recently diagnosed with IPF.  High res CT scan reviewed which shows minimal changes that is not concerning for pulmonary fibrosis. This can be monitored on CT scan to be done for nodule follow up  Plan/Recommendations: - Continue trelegy - CT scan without contrast in 1 year.  Marshell Garfinkel MD [facility] 08/19/2018, 9:03 AM  CC: [name], MD

## 2018-08-26 ENCOUNTER — Other Ambulatory Visit: Payer: Self-pay

## 2018-08-26 MED ORDER — FLUTICASONE-UMECLIDIN-VILANT 100-62.5-25 MCG/INH IN AEPB: 1.0000 | INHALATION_SPRAY | Freq: Every day | RESPIRATORY_TRACT | 5 refills | Status: DC

## 2018-09-08 ENCOUNTER — Other Ambulatory Visit: Payer: Self-pay | Admitting: Cardiovascular Disease

## 2018-09-08 ENCOUNTER — Other Ambulatory Visit: Payer: Self-pay | Admitting: Physician Assistant

## 2018-09-08 DIAGNOSIS — D508 Other iron deficiency anemias: Secondary | ICD-10-CM

## 2018-09-08 DIAGNOSIS — N5201 Erectile dysfunction due to arterial insufficiency: Secondary | ICD-10-CM | POA: Diagnosis not present

## 2018-09-08 DIAGNOSIS — N486 Induration penis plastica: Secondary | ICD-10-CM | POA: Diagnosis not present

## 2018-12-06 ENCOUNTER — Other Ambulatory Visit: Payer: Self-pay | Admitting: Cardiovascular Disease

## 2018-12-06 ENCOUNTER — Other Ambulatory Visit: Payer: Self-pay | Admitting: Gastroenterology

## 2018-12-06 DIAGNOSIS — D508 Other iron deficiency anemias: Secondary | ICD-10-CM

## 2018-12-09 ENCOUNTER — Other Ambulatory Visit: Payer: Self-pay

## 2018-12-09 MED ORDER — ATORVASTATIN CALCIUM 10 MG PO TABS: 10.0000 mg | ORAL_TABLET | Freq: Every day | ORAL | 0 refills | Status: DC

## 2019-01-08 DIAGNOSIS — Z0120 Encounter for dental examination and cleaning without abnormal findings: Secondary | ICD-10-CM | POA: Diagnosis not present

## 2019-01-11 ENCOUNTER — Other Ambulatory Visit: Payer: Self-pay | Admitting: Family Medicine

## 2019-02-08 ENCOUNTER — Encounter: Payer: Self-pay | Admitting: Family Medicine

## 2019-02-15 ENCOUNTER — Other Ambulatory Visit: Payer: Self-pay | Admitting: Pulmonary Disease

## 2019-03-14 ENCOUNTER — Other Ambulatory Visit: Payer: Self-pay | Admitting: Gastroenterology

## 2019-03-14 DIAGNOSIS — D508 Other iron deficiency anemias: Secondary | ICD-10-CM

## 2019-04-07 ENCOUNTER — Other Ambulatory Visit: Payer: Self-pay | Admitting: Family Medicine

## 2019-04-08 NOTE — Telephone Encounter (Signed)
Last office visit 06/16/2018 for CPE.  Last refilled 10/15/2017 for 454 g with 1 refill.  No future appointments.

## 2019-04-11 ENCOUNTER — Other Ambulatory Visit: Payer: Self-pay | Admitting: Cardiovascular Disease

## 2019-04-13 NOTE — Telephone Encounter (Signed)
Please contact pt overdue for 12 month f/u. Pt needs appointment.

## 2019-04-13 NOTE — Telephone Encounter (Signed)
No ans no vm   °

## 2019-04-15 ENCOUNTER — Encounter: Payer: Self-pay | Admitting: Gastroenterology

## 2019-04-17 NOTE — Telephone Encounter (Signed)
Per patient he is doing fine avs says as needed.  He will come in if he has to do so but  Wants to check the necessity

## 2019-05-21 ENCOUNTER — Other Ambulatory Visit: Payer: Self-pay | Admitting: Pulmonary Disease

## 2019-06-10 ENCOUNTER — Other Ambulatory Visit: Payer: Self-pay | Admitting: Gastroenterology

## 2019-06-10 DIAGNOSIS — D508 Other iron deficiency anemias: Secondary | ICD-10-CM

## 2019-07-03 HISTORY — PX: COLONOSCOPY: SHX174

## 2019-07-14 ENCOUNTER — Other Ambulatory Visit: Payer: Self-pay | Admitting: Cardiovascular Disease

## 2019-07-14 ENCOUNTER — Telehealth: Payer: Self-pay | Admitting: Family Medicine

## 2019-07-14 NOTE — Telephone Encounter (Signed)
Please schedule MWV with nurse and CPE with Dr. Lorelei Pont sometime in the next couple of months.

## 2019-07-22 ENCOUNTER — Encounter: Payer: Self-pay | Admitting: Family Medicine

## 2019-07-22 NOTE — Telephone Encounter (Signed)
Richard Grimes you help Richard Grimes with his father.  I am planning to not take any primary care patients for the remainder of my career.

## 2019-07-27 DIAGNOSIS — Z0120 Encounter for dental examination and cleaning without abnormal findings: Secondary | ICD-10-CM | POA: Diagnosis not present

## 2019-07-29 NOTE — Telephone Encounter (Signed)
Labs/medicare wellness 2/17 cpx 2/22 Pt aware

## 2019-07-31 ENCOUNTER — Other Ambulatory Visit: Payer: Self-pay

## 2019-07-31 DIAGNOSIS — R918 Other nonspecific abnormal finding of lung field: Secondary | ICD-10-CM

## 2019-08-12 ENCOUNTER — Other Ambulatory Visit: Payer: Self-pay | Admitting: Family Medicine

## 2019-08-12 DIAGNOSIS — E785 Hyperlipidemia, unspecified: Secondary | ICD-10-CM

## 2019-08-12 DIAGNOSIS — Z125 Encounter for screening for malignant neoplasm of prostate: Secondary | ICD-10-CM

## 2019-08-12 DIAGNOSIS — Z79899 Other long term (current) drug therapy: Secondary | ICD-10-CM

## 2019-08-17 ENCOUNTER — Telehealth: Payer: Self-pay

## 2019-08-17 ENCOUNTER — Other Ambulatory Visit: Payer: Self-pay | Admitting: Pulmonary Disease

## 2019-08-17 ENCOUNTER — Other Ambulatory Visit: Payer: Self-pay | Admitting: Cardiovascular Disease

## 2019-08-17 NOTE — Telephone Encounter (Signed)
LVM w COVID screen, front door and back lab info 2.15.2021 TLJ

## 2019-08-19 ENCOUNTER — Other Ambulatory Visit (INDEPENDENT_AMBULATORY_CARE_PROVIDER_SITE_OTHER): Payer: Medicare Other

## 2019-08-19 ENCOUNTER — Ambulatory Visit (INDEPENDENT_AMBULATORY_CARE_PROVIDER_SITE_OTHER): Payer: Medicare Other

## 2019-08-19 ENCOUNTER — Other Ambulatory Visit: Payer: Self-pay

## 2019-08-19 DIAGNOSIS — Z125 Encounter for screening for malignant neoplasm of prostate: Secondary | ICD-10-CM

## 2019-08-19 DIAGNOSIS — E785 Hyperlipidemia, unspecified: Secondary | ICD-10-CM | POA: Diagnosis not present

## 2019-08-19 DIAGNOSIS — Z0000 Encounter for general adult medical examination without abnormal findings: Secondary | ICD-10-CM

## 2019-08-19 DIAGNOSIS — Z79899 Other long term (current) drug therapy: Secondary | ICD-10-CM | POA: Diagnosis not present

## 2019-08-19 LAB — BASIC METABOLIC PANEL
BUN: 12 mg/dL (ref 6–23)
CO2: 31 mEq/L (ref 19–32)
Calcium: 8.8 mg/dL (ref 8.4–10.5)
Chloride: 105 mEq/L (ref 96–112)
Creatinine, Ser: 0.9 mg/dL (ref 0.40–1.50)
GFR: 84.19 mL/min (ref >60.00)
Glucose, Bld: 111 mg/dL — ABNORMAL HIGH (ref 70–99)
Potassium: 4.4 mEq/L (ref 3.5–5.1)
Sodium: 140 mEq/L (ref 135–145)

## 2019-08-19 LAB — CBC WITH DIFFERENTIAL/PLATELET
Basophils Absolute: 0.1 10*3/uL (ref 0.0–0.1)
Basophils Relative: 1.3 % (ref 0.0–3.0)
Eosinophils Absolute: 0.2 10*3/uL (ref 0.0–0.7)
Eosinophils Relative: 4.9 % (ref 0.0–5.0)
HCT: 43.9 % (ref 39.0–52.0)
Hemoglobin: 14.4 g/dL (ref 13.0–17.0)
Lymphocytes Relative: 28.3 % (ref 12.0–46.0)
Lymphs Abs: 1.4 10*3/uL (ref 0.7–4.0)
MCHC: 32.8 g/dL (ref 30.0–36.0)
MCV: 83.6 fl (ref 78.0–100.0)
Monocytes Absolute: 0.6 10*3/uL (ref 0.1–1.0)
Monocytes Relative: 12.4 % — ABNORMAL HIGH (ref 3.0–12.0)
Neutro Abs: 2.7 10*3/uL (ref 1.4–7.7)
Neutrophils Relative %: 53.1 % (ref 43.0–77.0)
Platelets: 187 10*3/uL (ref 150.0–400.0)
RBC: 5.25 Mil/uL (ref 4.22–5.81)
RDW: 14.1 % (ref 11.5–15.5)
WBC: 5.1 10*3/uL (ref 4.0–10.5)

## 2019-08-19 LAB — LIPID PANEL
Cholesterol: 102 mg/dL (ref 0–200)
HDL: 42 mg/dL (ref >39.00)
LDL Cholesterol: 48 mg/dL (ref 0–99)
NonHDL: 60.13
Total CHOL/HDL Ratio: 2
Triglycerides: 63 mg/dL (ref 0.0–149.0)
VLDL: 12.6 mg/dL (ref 0.0–40.0)

## 2019-08-19 LAB — HEPATIC FUNCTION PANEL
ALT: 25 U/L (ref 0–53)
AST: 19 U/L (ref 0–37)
Albumin: 3.8 g/dL (ref 3.5–5.2)
Alkaline Phosphatase: 93 U/L (ref 39–117)
Bilirubin, Direct: 0.1 mg/dL (ref 0.0–0.3)
Total Bilirubin: 0.5 mg/dL (ref 0.2–1.2)
Total Protein: 5.7 g/dL — ABNORMAL LOW (ref 6.0–8.3)

## 2019-08-19 NOTE — Progress Notes (Signed)
Subjective:   Richard Grimes is a 69 y.o. male who presents for Medicare Annual/Subsequent preventive examination.  Review of Systems: N/A   This visit is being conducted through telemedicine via telephone at the nurse health advisor's home address due to the COVID-19 pandemic. This patient has given me verbal consent via doximity to conduct this visit, patient states they are participating from their home address. Patient and myself are on the telephone call. There is no referral for this visit. Some vital signs may be absent or patient reported.    Patient identification: identified by name, DOB, and current address   Cardiac Risk Factors include: advanced age (>61men, >83 women);male gender     Objective:    Vitals: There were no vitals taken for this visit.  There is no height or weight on file to calculate BMI.  Advanced Directives 08/19/2019  Does Patient Have a Medical Advance Directive? No  Would patient like information on creating a medical advance directive? No - Patient declined    Tobacco Social History   Tobacco Use  Smoking Status Former Smoker  . Packs/day: 2.00  . Years: 35.00  . Pack years: 70.00  . Types: Cigarettes  . Quit date: 07/02/2004  . Years since quitting: 15.1  Smokeless Tobacco Never Used     Counseling given: Not Answered   Clinical Intake:  Pre-visit preparation completed: Yes  Pain : No/denies pain     Nutritional Risks: None Diabetes: No  How often do you need to have someone help you when you read instructions, pamphlets, or other written materials from your doctor or pharmacy?: 1 - Never What is the last grade level you completed in school?: Masters  Interpreter Needed?: No  Information entered by :: CJohnson, LPN  Past Medical History:  Diagnosis Date  . Allergic rhinitis due to pollen 07/18/2011  . COPD (chronic obstructive pulmonary disease) (HCC)    Severe, based on Spirometry.   . Depression   . GERD  (gastroesophageal reflux disease)   . Iron deficiency anemia    Past Surgical History:  Procedure Laterality Date  . CARDIAC CATHETERIZATION  2000   Normal coronaries  . COLONOSCOPY    . NASAL POLYP SURGERY    . POLYPECTOMY    . UPPER GASTROINTESTINAL ENDOSCOPY     Family History  Problem Relation Age of Onset  . Pulmonary fibrosis Mother   . GER disease Mother   . Hypertension Mother   . Colon polyps Mother   . Hypertension Father   . Deep vein thrombosis Maternal Grandfather   . Deep vein thrombosis Paternal Grandmother   . Deep vein thrombosis Paternal Grandfather   . Colon cancer Neg Hx   . Esophageal cancer Neg Hx   . Rectal cancer Neg Hx   . Stomach cancer Neg Hx    Social History   Socioeconomic History  . Marital status: Married    Spouse name: [name]  . Number of children: Not on file  . Years of education: Not on file  . Highest education level: Not on file  Occupational History  . Occupation: fumigation    Employer: [employer]  Tobacco Use  . Smoking status: Former Smoker    Packs/day: 2.00    Years: 35.00    Pack years: 70.00    Types: Cigarettes    Quit date: 07/02/2004    Years since quitting: 15.1  . Smokeless tobacco: Never Used  Substance and Sexual Activity  . Alcohol use:  Yes    Alcohol/week: 0.0 standard drinks    Comment: occ glass on wine  . Drug use: No  . Sexual activity: Yes    Partners: Female  Other Topics Concern  . Not on file  Social History Narrative  . Not on file   Social Determinants of Health   Financial Resource Strain: Low Risk   . Difficulty of Paying Living Expenses: Not hard at all  Food Insecurity: No Food Insecurity  . Worried About Charity fundraiser in the Last Year: Never true  . Ran Out of Food in the Last Year: Never true  Transportation Needs: No Transportation Needs  . Lack of Transportation (Medical): No  . Lack of Transportation (Non-Medical): No  Physical Activity: Inactive  . Days of  Exercise per Week: 0 days  . Minutes of Exercise per Session: 0 min  Stress: No Stress Concern Present  . Feeling of Stress : Not at all  Social Connections:   . Frequency of Communication with Friends and Family: Not on file  . Frequency of Social Gatherings with Friends and Family: Not on file  . Attends Religious Services: Not on file  . Active Member of Clubs or Organizations: Not on file  . Attends Archivist Meetings: Not on file  . Marital Status: Not on file    Outpatient Encounter Medications as of 08/19/2019  Medication Sig  . aspirin 81 MG tablet Take 81 mg by mouth daily.  Marland Kitchen atorvastatin (LIPITOR) 10 MG tablet TAKE 1 TABLET BY MOUTH ONCE DAILY (CONTACT  OFFICE  TO  SCHEDULE  FUTURE  APOINTMENT  AND  REFILLS)  . doxycycline (DORYX) 100 MG EC tablet Take 100 mg by mouth 2 (two) times daily.  Marland Kitchen omeprazole (PRILOSEC) 20 MG capsule TAKE 1 CAPSULE BY MOUTH TWICE DAILY BEFORE MEAL(S)  . predniSONE (DELTASONE) 20 MG tablet Take 20 mg by mouth 2 (two) times daily with a meal.  . PROVENTIL HFA 108 (90 Base) MCG/ACT inhaler INHALE TWO PUFFS BY MOUTH EVERY 6 HOURS AS NEEDED FOR WHEEZING OR  SHORTNESS  OF  BREATH  . TRELEGY ELLIPTA 100-62.5-25 MCG/INH AEPB INHALE 1 PUFF ONCE DAILY  . triamcinolone cream (KENALOG) 0.1 % APPLY  CREAM EXTERNALLY TO AFFECTED AREA TWICE DAILY  . FLUoxetine (PROZAC) 20 MG capsule Take 1 capsule by mouth once daily (Patient not taking: Reported on 08/19/2019)   No facility-administered encounter medications on file as of 08/19/2019.    Activities of Daily Living In your present state of health, do you have any difficulty performing the following activities: 08/19/2019  Hearing? N  Vision? N  Difficulty concentrating or making decisions? N  Walking or climbing stairs? N  Dressing or bathing? N  Doing errands, shopping? N  Preparing Food and eating ? N  Using the Toilet? N  In the past six months, have you accidently leaked urine? N  Do you have  problems with loss of bowel control? N  Managing your Medications? N  Managing your Finances? N  Housekeeping or managing your Housekeeping? N  Some recent data might be hidden    Patient Care Team: [name], MD as PCP - General   Assessment:   This is a routine wellness examination for [place].  Exercise Activities and Dietary recommendations Current Exercise Habits: The patient does not participate in regular exercise at present, Exercise limited by: None identified  Goals    . Patient Stated     08/19/2019, I will try to  start walking at least 1 mile a day.         Fall Risk Fall Risk  08/19/2019 06/16/2018  Falls in the past year? 0 0  Number falls in past yr: 0 -  Injury with Fall? 0 -  Risk for fall due to : No Fall Risks -  Follow up Falls evaluation completed;Falls prevention discussed -   Is the patient's home free of loose throw rugs in walkways, pet beds, electrical cords, etc?   yes       Grab bars in the bathroom? no      Handrails on the stairs?   yes      Adequate lighting?   yes  Timed Get Up and Go Performed: N/A  Depression Screen PHQ 2/9 Scores 08/19/2019 06/16/2018 04/24/2017  PHQ - 2 Score 0 0 0  PHQ- 9 Score 0 - -    Cognitive Function MMSE - Mini Mental State Exam 08/19/2019  Orientation to time 5  Orientation to Place 5  Registration 3  Attention/ Calculation 5  Recall 3  Language- repeat 1       Mini Cog  Mini-Cog screen was completed. Maximum score is 22. A value of 0 denotes this part of the MMSE was not completed or the patient failed this part of the Mini-Cog screening.  Immunization History  Administered Date(s) Administered  . Influenza Whole 05/24/2009  . Influenza, High Dose Seasonal PF 06/16/2018  . Influenza,inj,Quad PF,6+ Mos 04/24/2017  . Pneumococcal Conjugate-13 01/20/2014  . Pneumococcal Polysaccharide-23 01/04/2011, 09/16/2017  . Tdap 04/23/2016  . Zoster 01/20/2014    Qualifies for Shingles Vaccine: Yes    Screening Tests Health Maintenance  Topic Date Due  . INFLUENZA VACCINE  01/31/2019  . Fecal DNA (Cologuard)  05/15/2019  . COLON CANCER SCREENING ANNUAL FOBT  05/24/2019  . TETANUS/TDAP  04/23/2026  . Hepatitis C Screening  Completed  . PNA vac Low Risk Adult  Completed   Cancer Screenings: Lung: Low Dose CT Chest recommended if Age 75-80 years, 30 pack-year currently smoking OR have quit w/in 15 years. Patient does not qualify. Colorectal: due, Patient wants appointment setup  Additional Screenings:  Hepatitis C Screening: 04/20/2016      Plan:   Patient will try to start walking at least 1 mile a day.  I have personally reviewed and noted the following in the patient's chart:   . Medical and social history . Use of alcohol, tobacco or illicit drugs  . Current medications and supplements . Functional ability and status . Nutritional status . Physical activity . Advanced directives . List of other physicians . Hospitalizations, surgeries, and ER visits in previous 12 months . Vitals . Screenings to include cognitive, depression, and falls . Referrals and appointments  In addition, I have reviewed and discussed with patient certain preventive protocols, quality metrics, and best practice recommendations. A written personalized care plan for preventive services as well as general preventive health recommendations were provided to patient.     Denson, Julian, LPN  [id number]

## 2019-08-19 NOTE — Progress Notes (Signed)
PCP notes:  Health Maintenance: Needs flu vaccine Colonoscopy- due, Patient wants appointment setup   Abnormal Screenings: none   Patient concerns: Discuss callous on right foot   Nurse concerns: none   Next PCP appt.: 08/24/2019 @ 10:20 am

## 2019-08-19 NOTE — Patient Instructions (Addendum)
Richard Grimes , Thank you for taking time to come for your Medicare Wellness Visit. I appreciate your ongoing commitment to your health goals. Please review the following plan we discussed and let me know if I can assist you in the future.   Screening recommendations/referrals: Colonoscopy: due, Patient wants appointment setup.  Recommended yearly ophthalmology/optometry visit for glaucoma screening and checkup. Recommended yearly dental visit for hygiene and checkup.   Vaccinations: Influenza vaccine: will get at physical  Pneumococcal vaccine: Completed series Tdap vaccine: Up to date, completed 04/23/2016 Shingles vaccine: discussed     Advanced directives: Advance directive discussed with you today. Even though you declined this today please call our office should you change your mind and we can give you the proper paperwork for you to fill out.  Conditions/risks identified: none  Next appointment: 08/24/2019 @ 10:20 am   Preventive Care 65 Years and Older, Male Preventive care refers to lifestyle choices and visits with your health care provider that can promote health and wellness. What does preventive care include?  A yearly physical exam. This is also called an annual well check.  Dental exams once or twice a year.  Routine eye exams. Ask your health care provider how often you should have your eyes checked.  Personal lifestyle choices, including:  Daily care of your teeth and gums.  Regular physical activity.  Eating a healthy diet.  Avoiding tobacco and drug use.  Limiting alcohol use.  Practicing safe sex.  Taking low doses of aspirin every day.  Taking vitamin and mineral supplements as recommended by your health care provider. What happens during an annual well check? The services and screenings done by your health care provider during your annual well check will depend on your age, overall health, lifestyle risk factors, and family history of  disease. Counseling  Your health care provider may ask you questions about your:  Alcohol use.  Tobacco use.  Drug use.  Emotional well-being.  Home and relationship well-being.  Sexual activity.  Eating habits.  History of falls.  Memory and ability to understand (cognition).  Work and work Statistician. Screening  You may have the following tests or measurements:  Height, weight, and BMI.  Blood pressure.  Lipid and cholesterol levels. These may be checked every 5 years, or more frequently if you are over 4 years old.  Skin check.  Lung cancer screening. You may have this screening every year starting at age 29 if you have a 30-pack-year history of smoking and currently smoke or have quit within the past 15 years.  Fecal occult blood test (FOBT) of the stool. You may have this test every year starting at age 36.  Flexible sigmoidoscopy or colonoscopy. You may have a sigmoidoscopy every 5 years or a colonoscopy every 10 years starting at age 59.  Prostate cancer screening. Recommendations will vary depending on your family history and other risks.  Hepatitis C blood test.  Hepatitis B blood test.  Sexually transmitted disease (STD) testing.  Diabetes screening. This is done by checking your blood sugar (glucose) after you have not eaten for a while (fasting). You may have this done every 1-3 years.  Abdominal aortic aneurysm (AAA) screening. You may need this if you are a current or former smoker.  Osteoporosis. You may be screened starting at age 70 if you are at high risk. Talk with your health care provider about your test results, treatment options, and if necessary, the need for more tests. Vaccines  Your  health care provider may recommend certain vaccines, such as:  Influenza vaccine. This is recommended every year.  Tetanus, diphtheria, and acellular pertussis (Tdap, Td) vaccine. You may need a Td booster every 10 years.  Zoster vaccine. You may  need this after age 61.  Pneumococcal 13-valent conjugate (PCV13) vaccine. One dose is recommended after age 47.  Pneumococcal polysaccharide (PPSV23) vaccine. One dose is recommended after age 73. Talk to your health care provider about which screenings and vaccines you need and how often you need them. This information is not intended to replace advice given to you by your health care provider. Make sure you discuss any questions you have with your health care provider. Document Released: 07/15/2015 Document Revised: 03/07/2016 Document Reviewed: 04/19/2015 Elsevier Interactive Patient Education  2017 [place] Prevention in the Home Falls can cause injuries. They can happen to people of all ages. There are many things you can do to make your home safe and to help prevent falls. What can I do on the outside of my home?  Regularly fix the edges of walkways and driveways and fix any cracks.  Remove anything that might make you trip as you walk through a door, such as a raised step or threshold.  Trim any bushes or trees on the path to your home.  Use bright outdoor lighting.  Clear any walking paths of anything that might make someone trip, such as rocks or tools.  Regularly check to see if handrails are loose or broken. Make sure that both sides of any steps have handrails.  Any raised decks and porches should have guardrails on the edges.  Have any leaves, snow, or ice cleared regularly.  Use sand or salt on walking paths during winter.  Clean up any spills in your garage right away. This includes oil or grease spills. What can I do in the bathroom?  Use night lights.  Install grab bars by the toilet and in the tub and shower. Do not use towel bars as grab bars.  Use non-skid mats or decals in the tub or shower.  If you need to sit down in the shower, use a plastic, non-slip stool.  Keep the floor dry. Clean up any water that spills on the floor as soon as it  happens.  Remove soap buildup in the tub or shower regularly.  Attach bath mats securely with double-sided non-slip rug tape.  Do not have throw rugs and other things on the floor that can make you trip. What can I do in the bedroom?  Use night lights.  Make sure that you have a light by your bed that is easy to reach.  Do not use any sheets or blankets that are too big for your bed. They should not hang down onto the floor.  Have a firm chair that has side arms. You can use this for support while you get dressed.  Do not have throw rugs and other things on the floor that can make you trip. What can I do in the kitchen?  Clean up any spills right away.  Avoid walking on wet floors.  Keep items that you use a lot in easy-to-reach places.  If you need to reach something above you, use a strong step stool that has a grab bar.  Keep electrical cords out of the way.  Do not use floor polish or wax that makes floors slippery. If you must use wax, use non-skid floor wax.  Do not  have throw rugs and other things on the floor that can make you trip. What can I do with my stairs?  Do not leave any items on the stairs.  Make sure that there are handrails on both sides of the stairs and use them. Fix handrails that are broken or loose. Make sure that handrails are as long as the stairways.  Check any carpeting to make sure that it is firmly attached to the stairs. Fix any carpet that is loose or worn.  Avoid having throw rugs at the top or bottom of the stairs. If you do have throw rugs, attach them to the floor with carpet tape.  Make sure that you have a light switch at the top of the stairs and the bottom of the stairs. If you do not have them, ask someone to add them for you. What else can I do to help prevent falls?  Wear shoes that:  Do not have high heels.  Have rubber bottoms.  Are comfortable and fit you well.  Are closed at the toe. Do not wear sandals.  If you  use a stepladder:  Make sure that it is fully opened. Do not climb a closed stepladder.  Make sure that both sides of the stepladder are locked into place.  Ask someone to hold it for you, if possible.  Clearly mark and make sure that you can see:  Any grab bars or handrails.  First and last steps.  Where the edge of each step is.  Use tools that help you move around (mobility aids) if they are needed. These include:  Canes.  Walkers.  Scooters.  Crutches.  Turn on the lights when you go into a dark area. Replace any light bulbs as soon as they burn out.  Set up your furniture so you have a clear path. Avoid moving your furniture around.  If any of your floors are uneven, fix them.  If there are any pets around you, be aware of where they are.  Review your medicines with your doctor. Some medicines can make you feel dizzy. This can increase your chance of falling. Ask your doctor what other things that you can do to help prevent falls. This information is not intended to replace advice given to you by your health care provider. Make sure you discuss any questions you have with your health care provider. Document Released: 04/14/2009 Document Revised: 11/24/2015 Document Reviewed: 07/23/2014 Elsevier Interactive Patient Education  2017 [employer].

## 2019-08-20 LAB — PSA, TOTAL WITH REFLEX TO PSA, FREE: PSA, Total: 0.3 ng/mL (ref <=4.0)

## 2019-08-21 ENCOUNTER — Ambulatory Visit
Admission: RE | Admit: 2019-08-21 | Discharge: 2019-08-21 | Disposition: A | Discharge status: Home / Self Care | Payer: Medicare Other | Source: Ambulatory Visit | Attending: Pulmonary Disease | Admitting: Pulmonary Disease

## 2019-08-21 ENCOUNTER — Other Ambulatory Visit: Payer: Self-pay

## 2019-08-21 ENCOUNTER — Other Ambulatory Visit: Payer: Medicare Other

## 2019-08-21 DIAGNOSIS — R918 Other nonspecific abnormal finding of lung field: Secondary | ICD-10-CM | POA: Diagnosis not present

## 2019-08-21 DIAGNOSIS — R911 Solitary pulmonary nodule: Secondary | ICD-10-CM | POA: Diagnosis not present

## 2019-08-24 ENCOUNTER — Encounter: Payer: Self-pay | Admitting: Gastroenterology

## 2019-08-24 ENCOUNTER — Encounter: Payer: Self-pay | Admitting: Family Medicine

## 2019-08-24 ENCOUNTER — Other Ambulatory Visit: Payer: Self-pay

## 2019-08-24 ENCOUNTER — Ambulatory Visit (INDEPENDENT_AMBULATORY_CARE_PROVIDER_SITE_OTHER): Payer: Medicare Other | Admitting: Family Medicine

## 2019-08-24 VITALS — BP 140/76 | HR 84 | Temp 97.8°F | Ht 66.5 in | Wt 205.8 lb

## 2019-08-24 DIAGNOSIS — Z0000 Encounter for general adult medical examination without abnormal findings: Secondary | ICD-10-CM | POA: Diagnosis not present

## 2019-08-24 DIAGNOSIS — Z1211 Encounter for screening for malignant neoplasm of colon: Secondary | ICD-10-CM

## 2019-08-24 MED ORDER — NYSTATIN 100000 UNIT/GM EX CREA: 1.0000 "application " | TOPICAL_CREAM | Freq: Two times a day (BID) | CUTANEOUS | 2 refills | Status: DC

## 2019-08-24 NOTE — Patient Instructions (Signed)
Get some corn cream, like salicyclic acid and soften it up, then gently try to work it off. You can use a pumice stone gently.

## 2019-08-24 NOTE — Progress Notes (Signed)
Alysha Doolan T. Allan Bacigalupi, MD Primary Care and [place] at [facility] [place], [address] Phone: [phone]  FAX: [place] - 69 y.o. male  MRN [id number]  Date of Birth: 1950/09/22  Visit Date: 08/24/2019  PCP: [name], MD  Referred by: [name], MD  Chief Complaint  Patient presents with  . Annual Exam    Part 2    This visit occurred during the SARS-CoV-2 public health emergency.  Safety protocols were in place, including screening questions prior to the visit, additional usage of staff PPE, and extensive cleaning of exam room while observing appropriate contact time as indicated for disinfecting solutions.   Patient Care Team: [name], MD as PCP - General Subjective:   Richard Grimes is a 69 y.o. pleasant patient who presents with the following:  Preventative Health Maintenance Visit:  Health Maintenance Summary Reviewed and updated, unless pt declines services.  Tobacco History Reviewed. Alcohol: No concerns, no excessive use Exercise Habits: Some activity, rec at least 30 mins 5 times a week STD concerns: no risk or activity to increase risk Drug Use: None  F/u colon - he will call him to set up a colon ([place])  Lipids. Ok.  Rash in groin? Yeast.  He has had this off and on for some time, and he generally has used some over-the-counter clotrimazole, but this is not helping quite as much right now.  R toe?  He also has a corn between his fourth and fifth toes on the right.  Stopped his prozac. 07/14/2019.   Health Maintenance  Topic Date Due  . INFLUENZA VACCINE  01/31/2019  . Fecal DNA (Cologuard)  05/15/2019  . COLON CANCER SCREENING ANNUAL FOBT  05/24/2019  . TETANUS/TDAP  04/23/2026  . Hepatitis C Screening  Completed  . PNA vac Low Risk Adult  Completed   Immunization History  Administered Date(s) Administered  . Influenza Whole 05/24/2009  .  Influenza, High Dose Seasonal PF 06/16/2018  . Influenza,inj,Quad PF,6+ Mos 04/24/2017  . Pneumococcal Conjugate-13 01/20/2014  . Pneumococcal Polysaccharide-23 01/04/2011, 09/16/2017  . Tdap 04/23/2016  . Zoster 01/20/2014   Patient Active Problem List   Diagnosis Date Noted  . Coronary artery calcification seen on CT scan 09/18/2017  . 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 Aortic atherosclerosis ([place]) 09/16/2017  . Allergic rhinitis due to pollen 07/18/2011  . DEPRESSION 05/24/2009  . COPD (chronic obstructive pulmonary disease) with emphysema ([place]) 05/24/2009  . GERD 05/24/2009    Past Medical History:  Diagnosis Date  . Allergic rhinitis due to pollen 07/18/2011  . COPD (chronic obstructive pulmonary disease) (HCC)    Severe, based on Spirometry.   . Depression   . GERD (gastroesophageal reflux disease)   . Iron deficiency anemia     Past Surgical History:  Procedure Laterality Date  . CARDIAC CATHETERIZATION  2000   Normal coronaries  . COLONOSCOPY    . NASAL POLYP SURGERY    . POLYPECTOMY    . UPPER GASTROINTESTINAL ENDOSCOPY      Family History  Problem Relation Age of Onset  . Pulmonary fibrosis Mother   . GER disease Mother   . Hypertension Mother   . Colon polyps Mother   . Hypertension Father   . Deep vein thrombosis Maternal Grandfather   . Deep vein thrombosis Paternal Grandmother   . Deep  vein thrombosis Paternal Grandfather   . Colon cancer Neg Hx   . Esophageal cancer Neg Hx   . Rectal cancer Neg Hx   . Stomach cancer Neg Hx     Past Medical History, Surgical History, Social History, Family History, Problem List, Medications, and Allergies have been reviewed and updated if relevant.  Review of Systems: Pertinent positives are listed above.  Otherwise, a full 14 point review of systems has been done in full and it is negative except where it is noted positive.  Objective:   BP  140/76   Pulse 84   Temp 97.8 F (36.6 C) (Temporal)   Ht 5' 6.5" (1.689 m)   Wt 205 lb 12 oz (93.3 kg)   SpO2 98%   BMI 32.71 kg/m  Ideal Body Weight: Weight in (lb) to have BMI = 25: 156.9  Ideal Body Weight: Weight in (lb) to have BMI = 25: 156.9 No exam data present Depression screen [facility] 2/9 08/19/2019 06/16/2018 04/24/2017  Decreased Interest 0 0 0  Down, Depressed, Hopeless 0 0 0  PHQ - 2 Score 0 0 0  Altered sleeping 0 - -  Tired, decreased energy 0 - -  Change in appetite 0 - -  Feeling bad or failure about yourself  0 - -  Trouble concentrating 0 - -  Moving slowly or fidgety/restless 0 - -  Suicidal thoughts 0 - -  PHQ-9 Score 0 - -  Difficult doing work/chores Not difficult at all - -     GEN: well developed, well nourished, no acute distress Eyes: conjunctiva and lids normal, PERRLA, EOMI ENT: TM clear, nares clear, oral exam WNL Neck: supple, no lymphadenopathy, no thyromegaly, no JVD Pulm: clear to auscultation and percussion, respiratory effort normal CV: regular rate and rhythm, S1-S2, no murmur, rub or gallop, no bruits, peripheral pulses normal and symmetric, no cyanosis, clubbing, edema or varicosities GI: soft, non-tender; no hepatosplenomegaly, masses; active bowel sounds all quadrants GU: no hernia, testicular mass, penile discharge Lymph: no cervical, axillary or inguinal adenopathy MSK: gait normal, muscle tone and strength WNL, no joint swelling, effusions, discoloration, crepitus. SKIN: He does have a flat pinkish rash on his left groin with easily demarcated borders.  There is a corn present between the fourth and fifth on the right toe.   Neuro: normal mental status, normal strength, sensation, and motion Psych: alert; oriented to person, place and time, normally interactive and not anxious or depressed in appearance.  All labs reviewed with patient. Results for orders placed or performed in visit on 08/19/19  PSA, Total with Reflex to PSA, Free   Result Value Ref Range   PSA, Total 0.3 < OR = 4.0 ng/mL  Basic metabolic panel  Result Value Ref Range   Sodium 140 135 - 145 mEq/L   Potassium 4.4 3.5 - 5.1 mEq/L   Chloride 105 96 - 112 mEq/L   CO2 31 19 - 32 mEq/L   Glucose, Bld 111 (H) 70 - 99 mg/dL   BUN 12 6 - 23 mg/dL   Creatinine, Ser 0.90 0.40 - 1.50 mg/dL   GFR 84.19 >60.00 mL/min   Calcium 8.8 8.4 - 10.5 mg/dL  Hepatic function panel  Result Value Ref Range   Total Bilirubin 0.5 0.2 - 1.2 mg/dL   Bilirubin, Direct 0.1 0.0 - 0.3 mg/dL   Alkaline Phosphatase 93 39 - 117 U/L   AST 19 0 - 37 U/L   ALT 25 0 - 53 U/L  Total Protein 5.7 (L) 6.0 - 8.3 g/dL   Albumin 3.8 3.5 - 5.2 g/dL  CBC with Differential/Platelet  Result Value Ref Range   WBC 5.1 4.0 - 10.5 K/uL   RBC 5.25 4.22 - 5.81 Mil/uL   Hemoglobin 14.4 13.0 - 17.0 g/dL   HCT 43.9 39.0 - 52.0 %   MCV 83.6 78.0 - 100.0 fl   MCHC 32.8 30.0 - 36.0 g/dL   RDW 14.1 11.5 - 15.5 %   Platelets 187.0 150.0 - 400.0 K/uL   Neutrophils Relative % 53.1 43.0 - 77.0 %   Lymphocytes Relative 28.3 12.0 - 46.0 %   Monocytes Relative 12.4 (H) 3.0 - 12.0 %   Eosinophils Relative 4.9 0.0 - 5.0 %   Basophils Relative 1.3 0.0 - 3.0 %   Neutro Abs 2.7 1.4 - 7.7 K/uL   Lymphs Abs 1.4 0.7 - 4.0 K/uL   Monocytes Absolute 0.6 0.1 - 1.0 K/uL   Eosinophils Absolute 0.2 0.0 - 0.7 K/uL   Basophils Absolute 0.1 0.0 - 0.1 K/uL  Lipid panel  Result Value Ref Range   Cholesterol 102 0 - 200 mg/dL   Triglycerides 63.0 0.0 - 149.0 mg/dL   HDL 42.00 >39.00 mg/dL   VLDL 12.6 0.0 - 40.0 mg/dL   LDL Cholesterol 48 0 - 99 mg/dL   Total CHOL/HDL Ratio 2    NonHDL 60.13     Assessment and Plan:     ICD-10-CM   1. Healthcare maintenance  Z00.00   2. Screen for colon cancer  Z12.11    He will schedule his own colonoscopy. I did my best to try to convince him to get his COVID-19 vaccine.  Patient Instructions  Get some corn cream, like salicyclic acid and soften it up, then gently try  to work it off. You can use a pumice stone gently.    Health Maintenance Exam: The patient's preventative maintenance and recommended screening tests for an annual wellness exam were reviewed in full today. Brought up to date unless services declined.  Counselled on the importance of diet, exercise, and its role in overall health and mortality. The patient's FH and SH was reviewed, including their home life, tobacco status, and drug and alcohol status.  Follow-up in 1 year for physical exam or additional follow-up below.  Follow-up: No follow-ups on file. Or follow-up in 1 year if not noted.  Meds ordered this encounter  Medications  . nystatin cream (MYCOSTATIN)    Sig: Apply 1 application topically 2 (two) times daily.    Dispense:  30 g    Refill:  2   Medications Discontinued During This Encounter  Medication Reason  . doxycycline (DORYX) 100 MG EC tablet Completed Course  . FLUoxetine (PROZAC) 20 MG capsule Completed Course  . predniSONE (DELTASONE) 20 MG tablet Completed Course   No orders of the defined types were placed in this encounter.   Signed,  Maud Deed. Yaroslav Gombos, MD   Allergies as of 08/24/2019      Reactions   Penicillins Rash      Medication List       Accurate as of August 24, 2019 11:11 AM. If you have any questions, ask your nurse or doctor.        STOP taking these medications   doxycycline 100 MG EC tablet Commonly known as: DORYX Stopped by: [name], MD   FLUoxetine 20 MG capsule Commonly known as: PROZAC Stopped by: [name], MD   predniSONE 20  MG tablet Commonly known as: DELTASONE Stopped by: [name], MD     TAKE these medications   aspirin 81 MG tablet Take 81 mg by mouth daily.   atorvastatin 10 MG tablet Commonly known as: LIPITOR TAKE 1 TABLET BY MOUTH ONCE DAILY (CONTACT  OFFICE  TO  SCHEDULE  FUTURE  APOINTMENT  AND  REFILLS)   nystatin cream Commonly known as: MYCOSTATIN Apply 1 application  topically 2 (two) times daily. Started by: [name], MD   omeprazole 20 MG capsule Commonly known as: PRILOSEC TAKE 1 CAPSULE BY MOUTH TWICE DAILY BEFORE MEAL(S)   Proventil HFA 108 (90 Base) MCG/ACT inhaler Generic drug: albuterol INHALE TWO PUFFS BY MOUTH EVERY 6 HOURS AS NEEDED FOR WHEEZING OR  SHORTNESS  OF  BREATH   Trelegy Ellipta 100-62.5-25 MCG/INH Aepb Generic drug: Fluticasone-Umeclidin-Vilant INHALE 1 PUFF ONCE DAILY   triamcinolone cream 0.1 % Commonly known as: KENALOG APPLY  CREAM EXTERNALLY TO AFFECTED AREA TWICE DAILY

## 2019-08-25 ENCOUNTER — Telehealth: Payer: Self-pay

## 2019-08-25 DIAGNOSIS — R918 Other nonspecific abnormal finding of lung field: Secondary | ICD-10-CM

## 2019-08-25 NOTE — Telephone Encounter (Signed)
-----   Message from Marshell Garfinkel, MD sent at 08/25/2019 10:14 AM EST ----- Order CT chest without contrast in 1 year for lung nodule

## 2019-09-02 ENCOUNTER — Other Ambulatory Visit: Payer: Self-pay

## 2019-09-02 ENCOUNTER — Encounter: Payer: Self-pay | Admitting: Pulmonary Disease

## 2019-09-02 ENCOUNTER — Ambulatory Visit: Payer: Medicare Other | Admitting: Pulmonary Disease

## 2019-09-02 VITALS — BP 122/78 | HR 80 | Temp 98.3°F | Ht 66.5 in | Wt 211.2 lb

## 2019-09-02 DIAGNOSIS — J449 Chronic obstructive pulmonary disease, unspecified: Secondary | ICD-10-CM

## 2019-09-02 DIAGNOSIS — R918 Other nonspecific abnormal finding of lung field: Secondary | ICD-10-CM

## 2019-09-02 NOTE — Progress Notes (Signed)
Richard Grimes    [id number]    [birth year]  Primary Care Physician:Richard Grimes  Referring Physician: [name], Grimes Richard Grimes  Chief complaint: Follow-up for lung nodule, COPD, r/o  interstitial lung disease  HPI: 69 year old with history of asthma as a child, COPD.  His mother is my patient with recent diagnosis of IPF.  He is here at the advice of her mother to get his lungs checked out as well. Diagnosed with COPD in 2010 with mild symptoms of dyspnea on exertion.  He has productive cough with white mucus, denies any fevers, chills.  He is currently maintained on Dulera and Spiriva with about 1 exacerbation every year.  Switch to Trelegy in 2019 with improvement in symptoms He has seen Dr. 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  Previous exposure to chemicals including fumes, polymers.  Denies any asbestos, Architect exposure.  No mold exposure, no hot tubs. Smoking history: 70-pack-year smoking history.  Quit in 2004. Travel History: No recent travel.  Interim history: Breathing is doing well, continues on Trelegy inhaler Is working on weight loss and exercise.  Outpatient Encounter Medications as of 09/02/2019  Medication Sig  . aspirin 81 MG tablet Take 81 mg by mouth daily.  Marland Kitchen atorvastatin (LIPITOR) 10 MG tablet TAKE 1 TABLET BY MOUTH ONCE DAILY (CONTACT  OFFICE  TO  SCHEDULE  FUTURE  APOINTMENT  AND  REFILLS)  . nystatin cream (MYCOSTATIN) Apply 1 application topically 2 (two) times daily.  Marland Kitchen omeprazole (PRILOSEC) 20 MG capsule TAKE 1 CAPSULE BY MOUTH TWICE DAILY BEFORE MEAL(S)  . PROVENTIL HFA 108 (90 Base) MCG/ACT inhaler INHALE TWO PUFFS BY MOUTH EVERY 6 HOURS AS NEEDED FOR WHEEZING OR  SHORTNESS  OF  BREATH  . TRELEGY ELLIPTA 100-62.5-25 MCG/INH AEPB  INHALE 1 PUFF ONCE DAILY  . triamcinolone cream (KENALOG) 0.1 % APPLY  CREAM EXTERNALLY TO AFFECTED AREA TWICE DAILY   No facility-administered encounter medications on file as of 09/02/2019.   Physical Exam: Blood pressure 122/78, pulse 80, temperature 98.3 F (36.8 C), temperature source Temporal, height 5' 6.5" (1.689 m), weight 211 lb 3.2 oz (95.8 kg), SpO2 97 %. Gen:      No acute distress HEENT:  EOMI, sclera anicteric Neck:     No masses; no thyromegaly Lungs:    Clear to auscultation bilaterally; normal respiratory effort CV:         Regular rate and rhythm; no murmurs Abd:      + bowel sounds; soft, non-tender; no palpable masses, no distension Ext:    No edema; adequate peripheral perfusion Skin:      Warm and dry; no rash Neuro: alert and oriented x 3 Psych: normal mood and affect  Data Reviewed: Imaging Chest x-ray 07/27/15-left basilar atelectasis Chest x-ray 08/30/15-hyperinflation.  No lung infiltrate or nodule  High-resolution CT 09/04/17-nonspecific basal reticulation.  Aortic, coronary atherosclerosis.  Mild to moderate emphysema.  7 mm right lower lobe nodule. CT chest 04/22/2018-left upper lobe nodule is enlarged to 8 mm, several new pulmonary nodules in both lungs Moderate emphysema, two-vessel coronary atherosclerosis. PET scan 05/01/2018- FDG uptake in the lung nodules.  Faint stranding in the mesentery. CT chest 08/18/2018- decrease in size and number of previously noted pulmonary nodule.  The left upper lobe nodule has  decreased to 6 mm. CT chest 20/21-new 5 mm left upper lobe nodule.  Other subcentimeter pulmonary nodules remain stable I have reviewed the images personally  PFTs 09/23/17 FVC 3.74 [89%], FEV1 1.97 [63%], F/F 53, TLC 108%, DLCO 55% Moderate obstruction and diffusion defect  Biodesix Nodify XL 2 April 28, 2018 Pretest probability 16% risk of malignancy Test result- reduced risk of malignancy.  3% risk of malignancy  Assessment:  Moderate  COPD Continues on trelegy with no issues Did not desat on exertion Advised to work on diet, weight loss, exercise We discussed pulmonary rehab however he wants to try an exercise program at home by himself first.  He will call us in case he wants a referral to a [facility]  Pulmonary nodule We are following 8 mm left upper lobe nodule. PET scan does not show uptake he has reduced risk of malignancy on biodesix Follow-up CT scan reviewed which shows new 5 mm nodule that can be followed in 1 year.  Repeat CT scan without contrast in 1 year.  R/O ILD Concern for interstitial lung disease as his mother was recently diagnosed with IPF.  High res CT scan reviewed which shows minimal changes that is not concerning for pulmonary fibrosis. This can be monitored on CT scan to be done for nodule follow up  Plan/Recommendations: - Continue trelegy - CT scan without contrast in 1 year.  Richard Grimes Richard Grimes [facility] 09/02/2019, 10:16 AM  CC: Richard Grimes

## 2019-09-02 NOTE — Patient Instructions (Signed)
Glad you are doing well with regard to the breathing Continue Trelegy inhaler Work on weight loss Your CT scan shows small lung nodules that can be followed up with CT scan in 1 year  Follow-up in clinic in 6 months.

## 2019-09-02 NOTE — Addendum Note (Signed)
Addended by: Hildred Alamin I on: 09/02/2019 10:32 AM   Modules accepted: Orders

## 2019-09-16 ENCOUNTER — Other Ambulatory Visit: Payer: Self-pay | Admitting: Family Medicine

## 2019-09-16 ENCOUNTER — Other Ambulatory Visit: Payer: Self-pay | Admitting: Gastroenterology

## 2019-09-16 ENCOUNTER — Other Ambulatory Visit: Payer: Self-pay | Admitting: Cardiovascular Disease

## 2019-09-16 DIAGNOSIS — D508 Other iron deficiency anemias: Secondary | ICD-10-CM

## 2019-09-16 NOTE — Telephone Encounter (Signed)
Last office visit 08/24/2019.  Last refilled 04/08/2019 for 454 g with no refills.  No future appointments with PCP.

## 2019-09-20 ENCOUNTER — Other Ambulatory Visit: Payer: Self-pay | Admitting: Gastroenterology

## 2019-09-20 DIAGNOSIS — D508 Other iron deficiency anemias: Secondary | ICD-10-CM

## 2019-09-20 NOTE — Progress Notes (Signed)
Cardiology Office Note  Date:  09/21/2019   ID:  Richard Grimes, DOB 12-Dec-1950, MRN [id number]  PCP:  [name], MD   Chief Complaint  Patient presents with  . office visit    F/U appointment. Meds verbally reviewed with patient.    HPI:  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  PSH:    Past Surgical History:  Procedure Laterality Date  . CARDIAC CATHETERIZATION  2000   Normal coronaries  . COLONOSCOPY    . NASAL POLYP SURGERY    . POLYPECTOMY    . UPPER GASTROINTESTINAL ENDOSCOPY      Current Outpatient Medications  Medication Sig Dispense Refill  . aspirin 81 MG tablet Take 81 mg by mouth daily.    Marland Kitchen atorvastatin (LIPITOR) 10 MG tablet Take 1 tablet (10 mg total) by mouth daily. Please keep upcoming appointment for further refills. Thank you! 30 tablet 0  . nystatin cream (MYCOSTATIN) Apply 1 application topically 2 (two) times daily. (Patient taking differently: Apply 1 application topically 2 (two) times daily as needed. ) 30 g 2  . omeprazole (PRILOSEC) 20 MG capsule TAKE 1 CAPSULE BY MOUTH TWICE DAILY BEFORE  MEAL(S) 180 capsule 0  . PROVENTIL HFA 108 (90 Base) MCG/ACT inhaler INHALE TWO PUFFS BY MOUTH EVERY 6 HOURS AS NEEDED FOR WHEEZING OR  SHORTNESS  OF  BREATH 7 each 3  . TRELEGY ELLIPTA 100-62.5-25 MCG/INH AEPB INHALE 1 PUFF ONCE DAILY 180 each 0  . triamcinolone cream (KENALOG) 0.1 % APPLY  CREAM EXTERNALLY TO AFFECTED AREA TWICE DAILY (Patient taking differently: Apply 1 application topically 2 (two) times daily as needed. ) 454 g 0   No current facility-administered medications for this visit.     Allergies:   Penicillins   Social History:  The patient  reports that he quit smoking about 15 years ago. His smoking use included cigarettes. He has a 70.00 pack-year smoking history. He has never used smokeless tobacco. He reports current alcohol use. He reports that he does not use drugs.   Family History:   family history includes Colon polyps in his mother; Deep vein thrombosis in his maternal grandfather, paternal grandfather, and paternal grandmother; GER disease in his mother; Hypertension in his father and mother; Pulmonary fibrosis in his mother.    Review of Systems: Review of Systems  Constitutional: Negative.   Respiratory: Positive for shortness of breath.   Cardiovascular: Negative.   Gastrointestinal: Negative.   Musculoskeletal: Negative.   Neurological: Negative.   Psychiatric/Behavioral: Negative.   All other systems reviewed and are negative.    PHYSICAL EXAM: VS:  BP 140/80 (BP Location: Left Arm,  Patient Position: Sitting, Cuff Size: Normal)   Pulse 83   Ht 5' 6.5" (1.689 m)   Wt 209 lb 2 oz (94.9 kg)   SpO2 95%   BMI 33.25 kg/m  , BMI Body mass index is 33.25 kg/m. Constitutional:  oriented to person, place, and time. No distress.  HENT:  Head: Grossly normal Eyes:  no discharge. No scleral icterus.  Neck: No JVD, no carotid bruits  Cardiovascular: Regular rate and rhythm, no murmurs appreciated Pulmonary/Chest: Clear to auscultation bilaterally, no  wheezes or rails Abdominal: Soft.  no distension.  no tenderness.  Musculoskeletal: Normal range of motion Neurological:  normal muscle tone. Coordination normal. 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 EKG 12-Lead     Signed, Esmond Plants, M.D., Ph.D. 09/21/2019  [place], [place]

## 2019-09-21 ENCOUNTER — Encounter: Payer: Self-pay | Admitting: Cardiovascular Disease

## 2019-09-21 ENCOUNTER — Other Ambulatory Visit: Payer: Self-pay

## 2019-09-21 ENCOUNTER — Ambulatory Visit (INDEPENDENT_AMBULATORY_CARE_PROVIDER_SITE_OTHER): Payer: Medicare Other | Admitting: Cardiovascular Disease

## 2019-09-21 VITALS — BP 140/80 | HR 83 | Ht 66.5 in | Wt 209.1 lb

## 2019-09-21 DIAGNOSIS — I700 Atherosclerosis of aorta: Secondary | ICD-10-CM | POA: Diagnosis not present

## 2019-09-21 DIAGNOSIS — I2510 Atherosclerotic heart disease of native coronary artery without angina pectoris: Secondary | ICD-10-CM | POA: Diagnosis not present

## 2019-09-21 DIAGNOSIS — D508 Other iron deficiency anemias: Secondary | ICD-10-CM

## 2019-09-21 DIAGNOSIS — E782 Mixed hyperlipidemia: Secondary | ICD-10-CM | POA: Diagnosis not present

## 2019-09-21 NOTE — Patient Instructions (Signed)

## 2019-09-22 MED ORDER — OMEPRAZOLE 20 MG PO CPDR: DELAYED_RELEASE_CAPSULE | ORAL | 0 refills | Status: DC

## 2019-10-06 ENCOUNTER — Encounter: Payer: Medicare Other | Admitting: Gastroenterology

## 2019-10-17 ENCOUNTER — Other Ambulatory Visit: Payer: Self-pay | Admitting: Cardiovascular Disease

## 2019-10-19 ENCOUNTER — Encounter: Payer: Self-pay | Admitting: Family Medicine

## 2019-10-22 ENCOUNTER — Ambulatory Visit (AMBULATORY_SURGERY_CENTER): Payer: Self-pay | Admitting: *Deleted

## 2019-10-22 ENCOUNTER — Other Ambulatory Visit: Payer: Self-pay

## 2019-10-22 VITALS — Temp 96.9°F | Ht 66.5 in | Wt 200.0 lb

## 2019-10-22 DIAGNOSIS — Z86010 Personal history of colonic polyps: Secondary | ICD-10-CM

## 2019-10-22 DIAGNOSIS — Z01818 Encounter for other preprocedural examination: Secondary | ICD-10-CM

## 2019-10-22 NOTE — Progress Notes (Signed)
No egg or soy allergy known to patient  No issues with past sedation with any surgeries  or procedures, no intubation problems  No diet pills per patient No home 02 use per patient  No blood thinners per patient  Pt denies issues with constipation  No A fib or A flutter  EMMI video sent to pt's e mail   Dr Izora Gala day miralax prep- 3 days before [t will do 1 capful of miraalx bid starting 5-3 Monday   Due to the COVID-19 pandemic we are asking patients to follow these guidelines. Please only bring one care partner. Please be aware that your care partner may wait in the car in the parking lot or if they feel like they will be too hot to wait in the car, they may wait in the lobby on the 4th floor. All care partners are required to wear a mask the entire time (we do not have any that we can provide them), they need to practice social distancing, and we will do a Covid check for all patient's and care partners when you arrive. Also we will check their temperature and your temperature. If the care partner waits in their car they need to stay in the parking lot the entire time and we will call them on their cell phone when the patient is ready for discharge so they can bring the car to the front of the building. Also all patient's will need to wear a mask into building.

## 2019-11-02 ENCOUNTER — Ambulatory Visit (INDEPENDENT_AMBULATORY_CARE_PROVIDER_SITE_OTHER): Payer: Medicare Other

## 2019-11-02 ENCOUNTER — Encounter: Payer: Self-pay | Admitting: Gastroenterology

## 2019-11-02 ENCOUNTER — Other Ambulatory Visit: Payer: Self-pay | Admitting: Gastroenterology

## 2019-11-02 DIAGNOSIS — Z1159 Encounter for screening for other viral diseases: Secondary | ICD-10-CM

## 2019-11-03 LAB — SARS CORONAVIRUS 2 (TAT 6-24 HRS): SARS Coronavirus 2: NEGATIVE

## 2019-11-05 ENCOUNTER — Other Ambulatory Visit: Payer: Self-pay

## 2019-11-05 ENCOUNTER — Encounter: Payer: Self-pay | Admitting: Gastroenterology

## 2019-11-05 ENCOUNTER — Ambulatory Visit (AMBULATORY_SURGERY_CENTER): Payer: Medicare Other | Admitting: Gastroenterology

## 2019-11-05 VITALS — BP 116/75 | HR 54 | Temp 96.8°F | Resp 16 | Ht 66.0 in | Wt 200.0 lb

## 2019-11-05 DIAGNOSIS — D124 Benign neoplasm of descending colon: Secondary | ICD-10-CM | POA: Diagnosis not present

## 2019-11-05 DIAGNOSIS — D120 Benign neoplasm of cecum: Secondary | ICD-10-CM | POA: Diagnosis not present

## 2019-11-05 DIAGNOSIS — D123 Benign neoplasm of transverse colon: Secondary | ICD-10-CM | POA: Diagnosis not present

## 2019-11-05 DIAGNOSIS — I2510 Atherosclerotic heart disease of native coronary artery without angina pectoris: Secondary | ICD-10-CM | POA: Diagnosis not present

## 2019-11-05 DIAGNOSIS — Z86010 Personal history of colonic polyps: Secondary | ICD-10-CM | POA: Diagnosis not present

## 2019-11-05 DIAGNOSIS — Z1210 Encounter for screening for malignant neoplasm of intestinal tract, unspecified: Secondary | ICD-10-CM

## 2019-11-05 DIAGNOSIS — D122 Benign neoplasm of ascending colon: Secondary | ICD-10-CM | POA: Diagnosis not present

## 2019-11-05 DIAGNOSIS — J449 Chronic obstructive pulmonary disease, unspecified: Secondary | ICD-10-CM | POA: Diagnosis not present

## 2019-11-05 MED ORDER — SODIUM CHLORIDE 0.9 % IV SOLN
500.0000 mL | Freq: Once | INTRAVENOUS | Status: DC
Start: 2019-11-05 — End: 2019-11-05

## 2019-11-05 MED ORDER — SODIUM CHLORIDE 0.9 % IV SOLN: 500.0000 mL | Freq: Once | INTRAVENOUS | Status: DC

## 2019-11-05 NOTE — Patient Instructions (Signed)
Handout on polyps given to you today  Await pathology results   YOU HAD AN ENDOSCOPIC PROCEDURE TODAY AT THE [facility]:   Refer to the procedure report that was given to you for any specific questions about what was found during the examination.  If the procedure report does not answer your questions, please call your gastroenterologist to clarify.  If you requested that your care partner not be given the details of your procedure findings, then the procedure report has been included in a sealed envelope for you to review at your convenience later.  YOU SHOULD EXPECT: Some feelings of bloating in the abdomen. Passage of more gas than usual.  Walking can help get rid of the air that was put into your GI tract during the procedure and reduce the bloating. If you had a lower endoscopy (such as a colonoscopy or flexible sigmoidoscopy) you may notice spotting of blood in your stool or on the toilet paper. If you underwent a bowel prep for your procedure, you may not have a normal bowel movement for a few days.  Please Note:  You might notice some irritation and congestion in your nose or some drainage.  This is from the oxygen used during your procedure.  There is no need for concern and it should clear up in a day or so.  SYMPTOMS TO REPORT IMMEDIATELY:   Following lower endoscopy (colonoscopy or flexible sigmoidoscopy):  Excessive amounts of blood in the stool  Significant tenderness or worsening of abdominal pains  Swelling of the abdomen that is new, acute  Fever of 100F or higher  For urgent or emergent issues, a gastroenterologist can be reached at any hour by calling [phone]. Do not use MyChart messaging for urgent concerns.    DIET:  We do recommend a small meal at first, but then you may proceed to your regular diet.  Drink plenty of fluids but you should avoid alcoholic beverages for 24 hours.  ACTIVITY:  You should plan to take it easy for the rest of today and  you should NOT DRIVE or use heavy machinery until tomorrow (because of the sedation medicines used during the test).    FOLLOW UP: Our staff will call the number listed on your records 48-72 hours following your procedure to check on you and address any questions or concerns that you may have regarding the information given to you following your procedure. If we do not reach you, we will leave a message.  We will attempt to reach you two times.  During this call, we will ask if you have developed any symptoms of COVID 19. If you develop any symptoms (ie: fever, flu-like symptoms, shortness of breath, cough etc.) before then, please call [phone].  If you test positive for Covid 19 in the 2 weeks post procedure, please call and report this information to us.    If any biopsies were taken you will be contacted by phone or by letter within the next 1-3 weeks.  Please call us at [phone] if you have not heard about the biopsies in 3 weeks.    SIGNATURES/CONFIDENTIALITY: You and/or your care partner have signed paperwork which will be entered into your electronic medical record.  These signatures attest to the fact that that the information above on your After Visit Summary has been reviewed and is understood.  Full responsibility of the confidentiality of this discharge information lies with you and/or your care-partner. 

## 2019-11-05 NOTE — Op Note (Signed)
[facility] Patient Name: Richard Grimes Procedure Date: 11/05/2019 8:57 AM MRN: [id number] Endoscopist: Mallie Mussel L. Loletha Carrow , MD Age: 69 Referring MD:  Date of Birth: 01-22-1951 Gender: Male Account #: [id number] Procedure:                Colonoscopy Indications:              Surveillance: History of numerous (> 10) adenomas                            on last colonoscopy (< 3 yrs) - TA x 15 10/2016, TA                            x 10 05/2018 Medicines:                Monitored Anesthesia Care Procedure:                Pre-Anesthesia Assessment:                           - Prior to the procedure, a History and Physical                            was performed, and patient medications and                            allergies were reviewed. The patient's tolerance of                            previous anesthesia was also reviewed. The risks                            and benefits of the procedure and the sedation                            options and risks were discussed with the patient.                            All questions were answered, and informed consent                            was obtained. Prior Anticoagulants: The patient has                            taken no previous anticoagulant or antiplatelet                            agents. ASA Grade Assessment: III - A patient with                            severe systemic disease. After reviewing the risks                            and benefits, the patient was deemed in  satisfactory condition to undergo the procedure.                           After obtaining informed consent, the colonoscope                            was passed under direct vision. Throughout the                            procedure, the patient's blood pressure, pulse, and                            oxygen saturations were monitored continuously. The                            Colonoscope was introduced through the anus and                             advanced to the the cecum, identified by                            appendiceal orifice and ileocecal valve. The                            colonoscopy was performed without difficulty. The                            patient tolerated the procedure well. The quality                            of the bowel preparation was good after lavage. The                            ileocecal valve, appendiceal orifice, and rectum                            were photographed. The bowel preparation used was 2                            day Suprep/Miralax. Scope In: 9:11:04 AM Scope Out: 9:40:43 AM Scope Withdrawal Time: 0 hours 26 minutes 6 seconds  Total Procedure Duration: 0 hours 29 minutes 39 seconds  Findings:                 The perianal and digital rectal examinations were                            normal.                           Four sessile polyps were found in the proximal                            transverse colon, ascending colon and cecum. The  polyps were diminutive in size. These polyps were                            removed with a cold biopsy forceps. Resection and                            retrieval were complete. (Jar 1)                           A 12 mm polyp was found in the transverse colon.                            The polyp was semi-sessile. The polyp was removed                            with a piecemeal technique using a cold snare.                            Resection and retrieval were complete. (Jar 2)                           A diminutive polyp was found in the proximal                            descending colon. The polyp was sessile. The polyp                            was removed with a cold biopsy forceps. Resection                            and retrieval were complete. (Jar 2)                           A diminutive polyp was found in the descending                            colon. The polyp was  sessile. The polyp was removed                            with a cold biopsy forceps. Resection and retrieval                            were complete. (Jar 3)                           The exam was otherwise without abnormality on                            direct and retroflexion views. Complications:            No immediate complications. Estimated Blood Loss:     Estimated blood loss was minimal. Impression:               - Four diminutive polyps in  the proximal transverse                            colon, in the ascending colon and in the cecum,                            removed with a cold biopsy forceps. Resected and                            retrieved.                           - One 12 mm polyp in the transverse colon, removed                            piecemeal using a cold snare. Resected and                            retrieved.                           - One diminutive polyp in the proximal descending                            colon, removed with a cold biopsy forceps. Resected                            and retrieved.                           - One diminutive polyp in the descending colon,                            removed with a cold biopsy forceps. Resected and                            retrieved.                           - The examination was otherwise normal on direct                            and retroflexion views. Recommendation:           - Patient has a contact number available for                            emergencies. The signs and symptoms of potential                            delayed complications were discussed with the                            patient. Return to normal activities tomorrow.  Written discharge instructions were provided to the                            patient.                           - Resume previous diet.                           - Continue present medications.                           - Await  pathology results.                           - Repeat colonoscopy in 1 year for surveillance                            (multiple polyps every exam, prep quality).                           - Refer to a genetics counselor at appointment to                            be scheduled. 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 Loletha Carrow, MD 11/05/2019 9:50:51 AM This report has been signed electronically.

## 2019-11-05 NOTE — Progress Notes (Signed)
Report given to PACU, vss 

## 2019-11-06 ENCOUNTER — Other Ambulatory Visit: Payer: Self-pay

## 2019-11-06 ENCOUNTER — Telehealth: Payer: Self-pay

## 2019-11-06 DIAGNOSIS — Z86010 Personal history of colonic polyps: Secondary | ICD-10-CM

## 2019-11-06 NOTE — Telephone Encounter (Signed)
Referral sent to genetics will await for appointment.

## 2019-11-06 NOTE — Telephone Encounter (Signed)
-----   Message from Doran Stabler, MD sent at 11/05/2019 11:03 AM EDT ----- Please send a referral to [facility] [place] Counselor Indication:  Multiple colon polyps - ? Attenuated FAP  Patient aware of referral and is agreeable to the evaluation.  Thank you.  - HD

## 2019-11-06 NOTE — Progress Notes (Signed)
amb  

## 2019-11-09 ENCOUNTER — Telehealth: Payer: Self-pay

## 2019-11-09 ENCOUNTER — Telehealth: Payer: Self-pay | Admitting: *Deleted

## 2019-11-09 NOTE — Telephone Encounter (Signed)
Left message on follow up call. 

## 2019-11-09 NOTE — Telephone Encounter (Signed)
Attempted f/u phone call. No answer. Left message. °

## 2019-11-10 ENCOUNTER — Telehealth: Payer: Self-pay | Admitting: Genetic Counselor

## 2019-11-10 NOTE — Telephone Encounter (Signed)
Received a genetic counseling referral from dr. Loletha Carrow for Hx of colonic polyps. Richard Grimes returned my call and has been scheduled to see [name] on 5/18 at 9am. Pt aware to arrive 15 minutes early.

## 2019-11-10 NOTE — Telephone Encounter (Signed)
Patient is scheduled for appointment with Genetic counseling on 11/17/19 at 9:00 am.

## 2019-11-17 ENCOUNTER — Encounter: Payer: Self-pay | Admitting: Gastroenterology

## 2019-11-17 ENCOUNTER — Inpatient Hospital Stay: Payer: Medicare Other

## 2019-11-17 ENCOUNTER — Encounter: Payer: Self-pay | Admitting: Genetic Counselor

## 2019-11-17 ENCOUNTER — Inpatient Hospital Stay: Payer: Medicare Other | Attending: Genetic Counselor | Admitting: Genetic Counselor

## 2019-11-17 ENCOUNTER — Other Ambulatory Visit: Payer: Self-pay

## 2019-11-17 DIAGNOSIS — Z86010 Personal history of colonic polyps: Secondary | ICD-10-CM | POA: Diagnosis not present

## 2019-11-17 DIAGNOSIS — Z807 Family history of other malignant neoplasms of lymphoid, hematopoietic and related tissues: Secondary | ICD-10-CM | POA: Insufficient documentation

## 2019-11-17 DIAGNOSIS — Z83719 Family history of colon polyps, unspecified: Secondary | ICD-10-CM | POA: Insufficient documentation

## 2019-11-17 DIAGNOSIS — Z87891 Personal history of nicotine dependence: Secondary | ICD-10-CM | POA: Diagnosis not present

## 2019-11-17 DIAGNOSIS — Z801 Family history of malignant neoplasm of trachea, bronchus and lung: Secondary | ICD-10-CM | POA: Diagnosis not present

## 2019-11-17 DIAGNOSIS — Z8371 Family history of colonic polyps: Secondary | ICD-10-CM | POA: Insufficient documentation

## 2019-11-17 DIAGNOSIS — Z315 Encounter for genetic counseling: Secondary | ICD-10-CM

## 2019-11-17 DIAGNOSIS — Z808 Family history of malignant neoplasm of other organs or systems: Secondary | ICD-10-CM | POA: Insufficient documentation

## 2019-11-17 HISTORY — DX: Personal history of colonic polyps: Z86.010

## 2019-11-17 NOTE — Progress Notes (Signed)
REFERRING PROVIDER: Doran Stabler, MD [address] [place],  [place] [address]  PRIMARY PROVIDER:  [name], MD  PRIMARY REASON FOR VISIT:  1. History of colonic polyps   2. Family history of colonic polyps   3. Family history of skin cancer   4. Family history of melanoma   5. Family history of lymphoma   6. Family history of lung cancer      HISTORY OF PRESENT ILLNESS:   Mr. Gabay, a 69 y.o. male, was seen for a [facility] cancer genetics consultation at the request of Dr. Loletha Carrow due to a personal history of colon polyps.  Mr. Kirsh presents to clinic today to discuss the possibility of a hereditary predisposition to colon polyps/cancer, genetic testing, and to further clarify his future cancer risks, as well as potential cancer risks for family members.   Mr. Stenseth does not have a personal history of cancer. He has had 32 adenomatous colon polyps. His first colonoscopy (11/01/16) revealed 16 polyps, his second colonoscopy (05/23/18) revealed 9 polyps, and his most recent colonoscopy (11/05/19) revealed 7 polyps.    Past Medical History:  Diagnosis Date  . Allergic rhinitis due to pollen 07/18/2011  . Allergy   . COPD (chronic obstructive pulmonary disease) (HCC)    Severe, based on Spirometry.   . Depression   . Family history of colonic polyps   . Family history of lung cancer   . Family history of lymphoma   . Family history of melanoma   . Family history of skin cancer   . GERD (gastroesophageal reflux disease)   . History of colonic polyps 11/17/2019  . Hx of adenomatous colonic polyps   . Hyperlipidemia   . Iron deficiency anemia     Past Surgical History:  Procedure Laterality Date  . CARDIAC CATHETERIZATION  2000   Normal coronaries  . COLONOSCOPY     [id number]  . [place] and 1999  . POLYPECTOMY    . UPPER GASTROINTESTINAL ENDOSCOPY      Social History   Socioeconomic History  . Marital status: Married    Spouse  name: [name]  . Number of children: Not on file  . Years of education: Not on file  . Highest education level: Not on file  Occupational History  . Occupation: fumigation    Employer: [employer]  Tobacco Use  . Smoking status: Former Smoker    Packs/day: 2.00    Years: 35.00    Pack years: 70.00    Types: Cigarettes    Quit date: 07/02/2004    Years since quitting: 15.3  . Smokeless tobacco: Never Used  Substance and Sexual Activity  . Alcohol use: Yes    Alcohol/week: 0.0 standard drinks    Comment: occ glass on wine, or beer  . Drug use: No  . Sexual activity: Yes    Partners: Female  Other Topics Concern  . Not on file  Social History Narrative  . Not on file   Social Determinants of Health   Financial Resource Strain: Low Risk   . Difficulty of Paying Living Expenses: Not hard at all  Food Insecurity: No Food Insecurity  . Worried About Charity fundraiser in the Last Year: Never true  . Ran Out of Food in the Last Year: Never true  Transportation Needs: No Transportation Needs  . Lack of Transportation (Medical): No  . Lack of Transportation (Non-Medical): No  Physical Activity: Inactive  . Days of Exercise per Week: 0 days  . Minutes of Exercise per Session: 0 min  Stress: No Stress Concern Present  . Feeling of Stress : Not at all  Social Connections:   . Frequency of Communication with Friends and Family:   . Frequency of Social Gatherings with Friends and Family:   . Attends Religious Services:   . Active Member of Clubs or Organizations:   . Attends Archivist Meetings:   Marland Kitchen Marital Status:      FAMILY HISTORY:  We obtained a detailed, 4-generation family history.  Significant diagnoses are listed below: Family History  Problem Relation Age of Onset  . Pulmonary fibrosis Mother   . GER disease Mother   . Hypertension Mother   . Colon polyps Mother   . Hypertension Father   . Melanoma Father 93       lower back  . Skin cancer  Father 31       lip  . Alzheimer's disease Maternal Grandmother   . Deep vein thrombosis Maternal Grandfather   . Lymphoma Maternal Grandfather 35  . Deep vein thrombosis Paternal Grandmother   . Deep vein thrombosis Paternal Grandfather   . Skin cancer Paternal Grandfather        lip; dx. in his 23s  . Colon polyps Maternal Uncle   . Lung cancer Cousin 49       smoker (maternal cousin)  . Colon cancer Neg Hx   . Esophageal cancer Neg Hx   . Rectal cancer Neg Hx   . Stomach cancer Neg Hx    Mr. Borghi has one son (age 66) and two fraternal twin daughters (ages 31). One of his daughters, [name], had a colonoscopy within the past 3 or 4 years that was normal. He does not have any siblings.  Mr. Ghrist mother died at the age of 4 and had a history of colon polyps (unknown number), although Mr. Waibel states she had fewer polyps than him. Mr. Huseby had three maternal aunts and one maternal uncle. His uncle has a history of colon polyps, although he does not know how many. He has one maternal cousin who was diagnosed with lung cancer at the age of 102 and has a history of smoking. His maternal grandmother died at the age of 56 with Alzheimers, and his maternal father died at the age of 28 from [place] that was diagnosed when he was 40. There are no other known diagnoses of cancer/polyps on the maternal side of the family.  Mr. Eddinger father is 58 and has a history of melanoma removed from his lower back at the age of 18, and skin cancer removed from his lip at the age of 62. Mr. Dobek father has not had a colonoscopy. He has two paternal aunts who are in their 41s and have not had cancer. His paternal grandmother died at the age of 27 and his paternal grandfather died at the age of 61 and had a history of skin cancer of the lip diagnosed in his 56s, which was attributed to sun exposure. There are no other known diagnoses of cancer/polyps on the paternal side of the family.  Mr. Bart is  unaware of previous family history of genetic testing for hereditary cancer risks. His maternal ancestors are of [place] descent, and paternal ancestors are of [place] descent. There is no reported Ashkenazi Jewish ancestry. There is no known consanguinity among his parents. He reports that his maternal  grandparents may have been distant cousins as they had the same last name Carman Ching).  GENETIC COUNSELING ASSESSMENT: Mr. Eustice is a 69 y.o. male with a personal history of 25 adenomatous colon polyps, which is somewhat suggestive of a hereditary polyposis syndrome and predisposition to colon polyps/cancer. We, therefore, discussed and recommended the following at today's visit.   DISCUSSION: We discussed that polyps in general are common, however, most people have fewer than 5 lifetime polyps. When an individual has 10 or more polyps we become concerned about an underlying polyposis syndrome. The most common hereditary polyposis syndromes are Familial Adenomatous Polyposis (FAP), caused by mutations in the APC gene, and MUTYH-Associated Polyposis (MAP), caused by mutations in the MUTYH gene. There are other genes that are associated with polyposis, such as NTHL1 and MSH3. We discussed that testing is beneficial for several reasons, including knowing about cancer risks, identifying potential screening and risk-reduction options that may be appropriate, and to understand if other family members could be at risk for colon polyps and/or cancer and allow them to undergo genetic testing.   We reviewed the characteristics, features and inheritance patterns of hereditary cancer syndromes. We also discussed genetic testing, including the appropriate family members to test, the process of testing, insurance coverage and turn-around-time for results. We discussed the implications of a negative, positive and/or variant of uncertain significant result. We recommended Mr. Horrigan pursue genetic testing for the [facility]  CustomNext-Cancer + RNAinsight panel.   The CustomNext-Cancer+RNAinsight panel offered by [facility] includes sequencing and rearrangement analysis for up to 91 genes, which will include the following 47 genes for Mr. Lyness:  APC*, ATM*, AXIN2, BARD1, BMPR1A, BRCA1*, BRCA2*, BRIP1*, CDH1*, CDK4, CDKN2A, CHEK2*, DICER1, EPCAM, GREM1, HOXB13, MEN1, MLH1*, MSH2*, MSH3, MSH6*, MUTYH*, NBN, NF1*, NF2, NTHL1, PALB2*, PMS2*, POLD1, POLE, PTEN*, RAD51C*, RAD51D*, RECQL, RET, SDHA, SDHAF2, SDHB, SDHC, SDHD, SMAD4, SMARCA4, STK11, TP53*, TSC1, TSC2, and VHL.  DNA and RNA analyses performed for * genes.  Based on Mr. Barsch's personal history of 32 adenomatous colon polyps, he meets medical criteria for genetic testing. Despite that he meets criteria, he may still have an out of pocket cost. We discussed that if his out of pocket cost for testing is over $100, the laboratory will reach out to let him know. If the out of pocket cost of testing is less than $100 he will be billed by the genetic testing laboratory.   We discussed that some people do not want to undergo genetic testing due to fear of genetic discrimination.  A federal law called the Genetic Information Non-Discrimination Act (GINA) of 2008 helps protect individuals against genetic discrimination based on their genetic test results.  It impacts both health insurance and employment.  With health insurance, it protects against increased premiums, being kicked off insurance or being forced to take a test in order to be insured.  For employment it protects against hiring, firing and promoting decisions based on genetic test results.  Health status due to a cancer diagnosis is not protected under GINA.  Additionally, life, disability, and long-term care insurance is not protected under GINA.   PLAN: After considering the risks, benefits, and limitations, Mr. Konopka provided informed consent to pursue genetic testing and the blood sample was sent to [facility] for analysis of the CustomNext-Cancer + RNAinsight panel. Results should be available within approximately two-three weeks' time, at which point they will be disclosed by telephone to Mr. Whitehurst, as will any additional recommendations warranted by these results.  Mr. Boyd will receive a summary of his genetic counseling visit and a copy of his results once available. This information will also be available in Epic.   Mr. Hevener questions were answered to his satisfaction today. Our contact information was provided should additional questions or concerns arise. Thank you for the referral and allowing [place] to share in the care of your patient.   Clint Guy, [place], [facility] Licensed, Certified Dispensing [email or web address] Phone: [phone]  The patient was seen for a total of 40 minutes in face-to-face genetic counseling.  This patient was discussed with Drs. Magrinat, Lindi Adie and/or Burr Medico who agrees with the above.    _______________________________________________________________________ For Office Staff:  Number of people involved in session: 1 Was an Intern/ student involved with case: no

## 2019-11-18 ENCOUNTER — Other Ambulatory Visit: Payer: Self-pay | Admitting: Pulmonary Disease

## 2019-11-18 ENCOUNTER — Other Ambulatory Visit: Payer: Self-pay | Admitting: *Deleted

## 2019-12-04 ENCOUNTER — Telehealth: Payer: Self-pay | Admitting: Genetic Counselor

## 2019-12-04 NOTE — Telephone Encounter (Signed)
LVM that his genetic test results are available and requested that he call back to discuss them.  

## 2019-12-11 NOTE — Telephone Encounter (Signed)
LVM that his genetic test results are available and requested that he call back to discuss them.  

## 2019-12-21 ENCOUNTER — Encounter: Payer: Self-pay | Admitting: Genetic Counselor

## 2019-12-21 NOTE — Telephone Encounter (Signed)
LVM that his genetic test results are available and requested that he call back to discuss them.  

## 2019-12-25 DIAGNOSIS — L720 Epidermal cyst: Secondary | ICD-10-CM | POA: Diagnosis not present

## 2019-12-28 ENCOUNTER — Encounter: Payer: Self-pay | Admitting: Genetic Counselor

## 2019-12-28 ENCOUNTER — Ambulatory Visit: Payer: Self-pay | Admitting: Genetic Counselor

## 2019-12-28 ENCOUNTER — Telehealth: Payer: Self-pay | Admitting: Genetic Counselor

## 2019-12-28 DIAGNOSIS — Z1379 Encounter for other screening for genetic and chromosomal anomalies: Secondary | ICD-10-CM | POA: Insufficient documentation

## 2019-12-28 NOTE — Progress Notes (Signed)
HPI:  [name] was previously seen in the [facility] due to a personal history of colon polyps and concerns regarding a hereditary predisposition to polyps/cancer. Please refer to our prior cancer genetics clinic note for more information regarding our discussion, assessment and recommendations, at the time. [name] recent genetic test results were disclosed to him, as were recommendations warranted by these results. These results and recommendations are discussed in more detail below.  FAMILY HISTORY:  We obtained a detailed, 4-generation family history.  Significant diagnoses are listed below: Family History  Problem Relation Age of Onset  . Pulmonary fibrosis Mother   . GER disease Mother   . Hypertension Mother   . Colon polyps Mother   . Hypertension Father   . Melanoma Father 52       lower back  . Skin cancer Father 72       lip  . Alzheimer's disease Maternal Grandmother   . Deep vein thrombosis Maternal Grandfather   . Lymphoma Maternal Grandfather 71  . Deep vein thrombosis Paternal Grandmother   . Deep vein thrombosis Paternal Grandfather   . Skin cancer Paternal Grandfather        lip; dx. in his 45s  . Colon polyps Maternal Uncle   . Lung cancer Cousin 40       smoker (maternal cousin)  . Colon cancer Neg Hx   . Esophageal cancer Neg Hx   . Rectal cancer Neg Hx   . Stomach cancer Neg Hx    [name] has one son (age 60) and two fraternal twin daughters (ages 59). One of his daughters, Richard Grimes, had a colonoscopy within the past 3 or 4 years that was normal. He does not have any siblings.  [name] mother died at the age of 37 and had a history of colon polyps (unknown number), although [name] states she had fewer polyps than him. [name] had three maternal aunts and one maternal uncle. His uncle has a history of colon polyps, although he does not know how many. He has one maternal cousin who was diagnosed with lung cancer at the  age of 66 and has a history of smoking. His maternal grandmother died at the age of 18 with Alzheimers, and his maternal father died at the age of 83 from Scurry that was diagnosed when he was 39. There are no other known diagnoses of cancer/polyps on the maternal side of the family.  [name] father is 78 and has a history of melanoma removed from his lower back at the age of 85, and skin cancer removed from his lip at the age of 86. [name] father has not had a colonoscopy. He has two paternal aunts who are in their 84s and have not had cancer. His paternal grandmother died at the age of 46 and his paternal grandfather died at the age of 88 and had a history of skin cancer of the lip diagnosed in his 40s, which was attributed to sun exposure. There are no other known diagnoses of cancer/polyps on the paternal side of the family.  [name] is unaware of previous family history of genetic testing for hereditary cancer risks. His maternal ancestors are of [place] descent, and paternal ancestors are of English descent. There is no reported Ashkenazi Jewish ancestry. There is no known consanguinity among his parents. He reports that his maternal grandparents may have been distant cousins as they had the same last name Richard Grimes).  GENETIC TEST RESULTS: Genetic testing reported out on 11/28/2019 through the [facility] CustomNext-Cancer + RNAinsight panel. No pathogenic variants were detected.   The CustomNext-Cancer+RNAinsight panel offered by [facility] includes sequencing and rearrangement analysis for up to 91 genes, which will include the following 47 genes for [name]:  APC*, ATM*, AXIN2, BARD1, BMPR1A, BRCA1*, BRCA2*, BRIP1*, CDH1*, CDK4, CDKN2A, CHEK2*, DICER1, EPCAM, GREM1, HOXB13, MEN1, MLH1*, MSH2*, MSH3, MSH6*, MUTYH*, NBN, NF1*, NF2, NTHL1, PALB2*, PMS2*, POLD1, POLE, PTEN*, RAD51C*, RAD51D*, RECQL, RET, SDHA, SDHAF2, SDHB, SDHC, SDHD, SMAD4, SMARCA4, STK11, TP53*, TSC1, TSC2, and VHL.   DNA and RNA analyses performed for * genes. The test report will be scanned into EPIC and located under the Molecular Pathology section of the Results Review tab.  A portion of the result report is included below for reference.     We discussed with [name] that because current genetic testing is not perfect, it is possible there may be a gene mutation in one of these genes that current testing cannot detect, but that chance is small. We also discussed that there could be another gene that has not yet been discovered, or that we have not yet tested, that is responsible for his personal history of colon polyps. Therefore, it is important to remain in touch with cancer genetics in the future so that we can continue to offer [name] the most up to date genetic testing.   CANCER SCREENING RECOMMENDATIONS: [name] test result is considered negative (normal).  This means that we have not identified a hereditary cause for his personal history of colon polyps at this time. While reassuring, this does not definitively rule out a hereditary predisposition to colon polyps/cancer. It is still possible that there could be genetic mutations that are undetectable by current technology. There could be genetic mutations in genes that have not been tested or identified to increase cancer risk. Therefore, it is recommended he continue to follow the cancer management and screening guidelines provided by his GI and primary healthcare providers.   An individual's cancer risk and medical management are not determined by genetic test results alone. Overall cancer risk assessment incorporates additional factors, including personal medical history, family history, and any available genetic information that may result in a personalized plan for cancer prevention and surveillance.  According to NCCN Guidelines (Genetic/Familial High-Risk Assessment: Colorectal Version 1.2021), individuals who have had more than 20 but less  than 100 adenomas and normal genetic testing should have a colonoscopy and polypectomy every 1-2 years. These individuals may also consider a baseline upper endoscopy (including complete visualization of the ampulla of Vater) and repeat as indicated.  RECOMMENDATIONS FOR FAMILY MEMBERS:  Individuals in this family might be at some increased risk of developing colon polyps/cancer, over the general population risk, simply due to the family history of colon polyps. According to NCCN Guidelines (Genetic/Familial High-Risk Assessment: Colorectal Version 1.2021), all first-degree relatives should have colonoscopies beginning in their late teens, then every 2 years. Initial initiation age and frequency of colonoscopy may be modified based on clinical judgment.   FOLLOW-UP: Lastly, we discussed with [name] that cancer genetics is a rapidly advancing field and it is possible that new genetic tests will be appropriate for him and/or his family members in the future. We encouraged him to remain in contact with cancer genetics on an annual basis so we can update his personal and family histories and let him know of advances in cancer genetics that may benefit this family.  Our contact number was provided. [name] questions were answered to his satisfaction, and he knows he is welcome to call us at anytime with additional questions or concerns.   Richard Guy, MS, [facility] Genetic Counselor [email or web address] Phone: [phone]

## 2019-12-28 NOTE — Telephone Encounter (Signed)
Revealed negative genetic testing. Discussed that we do not know why he had multiple colon polyps. It is possible that there could be a mutation in a different gene that we are not testing, or our current technology may not be able detect certain mutations. It will therefore be important for him to stay in contact with genetics to keep up with whether additional testing may be appropriate in the future.

## 2020-01-10 ENCOUNTER — Other Ambulatory Visit: Payer: Self-pay | Admitting: Family Medicine

## 2020-01-11 NOTE — Telephone Encounter (Signed)
Last office visit 08/24/2019 for CPE.  Last refilled 09/16/2019 for 454 g with no refills.  No future appointments.

## 2020-03-14 ENCOUNTER — Other Ambulatory Visit: Payer: Self-pay | Admitting: Gastroenterology

## 2020-03-14 DIAGNOSIS — D508 Other iron deficiency anemias: Secondary | ICD-10-CM

## 2020-03-18 ENCOUNTER — Encounter: Payer: Self-pay | Admitting: Oncology

## 2020-03-18 ENCOUNTER — Telehealth: Payer: Self-pay | Admitting: Oncology

## 2020-03-18 NOTE — Telephone Encounter (Signed)
Called to Discuss with patient about Covid symptoms and the use of regeneron, a monoclonal antibody infusion for those with mild to moderate Covid symptoms and at a high risk of hospitalization.     Pt is qualified for this infusion at the [facility] due to co-morbid conditions and/or a member of an at-risk group.   Past Medical History:  Diagnosis Date  . Allergic rhinitis due to pollen 07/18/2011  . Allergy   . COPD (chronic obstructive pulmonary disease) (HCC)    Severe, based on Spirometry.   . Depression   . Family history of colonic polyps   . Family history of lung cancer   . Family history of lymphoma   . Family history of melanoma   . Family history of skin cancer   . GERD (gastroesophageal reflux disease)   . History of colonic polyps 11/17/2019  . Hx of adenomatous colonic polyps   . Hyperlipidemia   . Iron deficiency anemia    Unable to reach pt. Left VM and mychart message.   Rulon Abide, AGNP-C [phone] ([place])

## 2020-03-23 ENCOUNTER — Telehealth: Payer: Self-pay | Admitting: Oncology

## 2020-03-23 ENCOUNTER — Encounter: Payer: Self-pay | Admitting: Oncology

## 2020-03-23 ENCOUNTER — Ambulatory Visit (HOSPITAL_COMMUNITY)
Admission: RE | Admit: 2020-03-23 | Discharge: 2020-03-23 | Disposition: A | Discharge status: Home / Self Care | Payer: Medicare Other | Source: Ambulatory Visit | Attending: Pulmonary Disease | Admitting: Pulmonary Disease

## 2020-03-23 ENCOUNTER — Other Ambulatory Visit: Payer: Self-pay | Admitting: Oncology

## 2020-03-23 DIAGNOSIS — Z23 Encounter for immunization: Secondary | ICD-10-CM | POA: Diagnosis not present

## 2020-03-23 DIAGNOSIS — U071 COVID-19: Secondary | ICD-10-CM | POA: Diagnosis present

## 2020-03-23 MED ORDER — SODIUM CHLORIDE 0.9 % IV SOLN
1200.0000 mg | Freq: Once | INTRAVENOUS | Status: AC
  Administered 2020-03-23: 1200 mg via INTRAVENOUS

## 2020-03-23 MED ORDER — FAMOTIDINE IN NACL 20-0.9 MG/50ML-% IV SOLN: 20.0000 mg | Freq: Once | INTRAVENOUS | Status: DC | PRN

## 2020-03-23 MED ORDER — SODIUM CHLORIDE 0.9 % IV SOLN: INTRAVENOUS | Status: DC | PRN

## 2020-03-23 MED ORDER — DIPHENHYDRAMINE HCL 50 MG/ML IJ SOLN: 50.0000 mg | Freq: Once | INTRAMUSCULAR | Status: DC | PRN

## 2020-03-23 MED ORDER — ALBUTEROL SULFATE HFA 108 (90 BASE) MCG/ACT IN AERS: 2.0000 | INHALATION_SPRAY | Freq: Once | RESPIRATORY_TRACT | Status: DC | PRN

## 2020-03-23 MED ORDER — METHYLPREDNISOLONE SODIUM SUCC 125 MG IJ SOLR: 125.0000 mg | Freq: Once | INTRAMUSCULAR | Status: DC | PRN

## 2020-03-23 MED ORDER — EPINEPHRINE 0.3 MG/0.3ML IJ SOAJ: 0.3000 mg | Freq: Once | INTRAMUSCULAR | Status: DC | PRN

## 2020-03-23 NOTE — Discharge Instructions (Signed)

## 2020-03-23 NOTE — Progress Notes (Signed)
I connected by phone with  Richard Grimes to discuss the potential use of an new treatment for mild to moderate COVID-19 viral infection in non-hospitalized patients.   This patient is a age/sex that meets the FDA criteria for Emergency Use Authorization of casirivimab\imdevimab.  Has a (+) direct SARS-CoV-2 viral test result 1. Has mild or moderate COVID-19  2. Is ? 67 years of age and weighs ? 40 kg 3. Is NOT hospitalized due to COVID-19 4. Is NOT requiring oxygen therapy or requiring an increase in baseline oxygen flow rate due to COVID-19 5. Is within 10 days of symptom onset 6. Has at least one of the high risk factor(s) for progression to severe COVID-19 and/or hospitalization as defined in EUA. Specific high risk criteria : Past Medical History:  Diagnosis Date  . Allergic rhinitis due to pollen 07/18/2011  . Allergy   . COPD (chronic obstructive pulmonary disease) (HCC)    Severe, based on Spirometry.   . Depression   . Family history of colonic polyps   . Family history of lung cancer   . Family history of lymphoma   . Family history of melanoma   . Family history of skin cancer   . GERD (gastroesophageal reflux disease)   . History of colonic polyps 11/17/2019  . Hx of adenomatous colonic polyps   . Hyperlipidemia   . Iron deficiency anemia   ?  ?    Symptom onset  03/16/2020   I have spoken and communicated the following to the patient or parent/caregiver:   1. FDA has authorized the emergency use of casirivimab\imdevimab for the treatment of mild to moderate COVID-19 in adults and pediatric patients with positive results of direct SARS-CoV-2 viral testing who are 69 years of age and older weighing at least 40 kg, and who are at high risk for progressing to severe COVID-19 and/or hospitalization.   2. The significant known and potential risks and benefits of casirivimab\imdevimab, and the extent to which such potential risks and benefits are unknown.   3. Information on  available alternative treatments and the risks and benefits of those alternatives, including clinical trials.   4. Patients treated with casirivimab\imdevimab should continue to self-isolate and use infection control measures (e.g., wear mask, isolate, social distance, avoid sharing personal items, clean and disinfect "high touch" surfaces, and frequent handwashing) according to CDC guidelines.    5. The patient or parent/caregiver has the option to accept or refuse casirivimab\imdevimab .   After reviewing this information with the patient, The patient agreed to proceed with receiving casirivimab\imdevimab infusion and will be provided a copy of the Fact sheet prior to receiving the infusion.Rulon Abide, AGNP-C [phone] ([place])

## 2020-03-23 NOTE — Telephone Encounter (Signed)
Called to Discuss with patient about Covid symptoms and the use of regeneron, a monoclonal antibody infusion for those with mild to moderate Covid symptoms and at a high risk of hospitalization.     Pt is qualified for this infusion at the [facility] infusion center due to co-morbid conditions and/or a member of an at-risk group.    Past Medical History:  Diagnosis Date  . Allergic rhinitis due to pollen 07/18/2011  . Allergy   . COPD (chronic obstructive pulmonary disease) (HCC)    Severe, based on Spirometry.   . Depression   . Family history of colonic polyps   . Family history of lung cancer   . Family history of lymphoma   . Family history of melanoma   . Family history of skin cancer   . GERD (gastroesophageal reflux disease)   . History of colonic polyps 11/17/2019  . Hx of adenomatous colonic polyps   . Hyperlipidemia   . Iron deficiency anemia       Unable to reach pt. Left VM and MCM.   Rulon Abide, AGNP-C [phone] ([place])

## 2020-03-23 NOTE — Progress Notes (Signed)
  Diagnosis: COVID-19  Physician: Dr. Lamona Curl  Procedure: Covid Infusion Clinic Med: casirivimab\imdevimab infusion - Provided patient with casirivimab\imdevimab fact sheet for patients, parents and caregivers prior to infusion.  Complications: No immediate complications noted.  Discharge: Discharged home   Winfred Leeds 03/23/2020

## 2020-04-06 ENCOUNTER — Encounter: Payer: Self-pay | Admitting: Family Medicine

## 2020-04-07 ENCOUNTER — Ambulatory Visit (INDEPENDENT_AMBULATORY_CARE_PROVIDER_SITE_OTHER): Payer: Medicare Other | Admitting: Family Medicine

## 2020-04-07 ENCOUNTER — Other Ambulatory Visit: Payer: Self-pay

## 2020-04-07 ENCOUNTER — Encounter: Payer: Self-pay | Admitting: Family Medicine

## 2020-04-07 VITALS — BP 110/70 | HR 82 | Temp 97.7°F | Ht 66.5 in | Wt 189.5 lb

## 2020-04-07 DIAGNOSIS — Z87891 Personal history of nicotine dependence: Secondary | ICD-10-CM

## 2020-04-07 DIAGNOSIS — R9389 Abnormal findings on diagnostic imaging of other specified body structures: Secondary | ICD-10-CM

## 2020-04-07 DIAGNOSIS — R059 Cough, unspecified: Secondary | ICD-10-CM

## 2020-04-07 DIAGNOSIS — R042 Hemoptysis: Secondary | ICD-10-CM

## 2020-04-07 DIAGNOSIS — J431 Panlobular emphysema: Secondary | ICD-10-CM | POA: Diagnosis not present

## 2020-04-07 DIAGNOSIS — R918 Other nonspecific abnormal finding of lung field: Secondary | ICD-10-CM

## 2020-04-07 DIAGNOSIS — R5081 Fever presenting with conditions classified elsewhere: Secondary | ICD-10-CM

## 2020-04-07 NOTE — Progress Notes (Signed)
Richard Grimes T. Richard Wassenaar, MD, [place]  [facility] and [place] at [facility] [place], [address]  Phone: [phone]  FAX: [phone]  Richard Grimes - 69 y.o. male  MRN [id number]  Date of Birth: [birth year]  Date: 04/07/2020  PCP: [name], MD  Referral: [name], MD  Chief Complaint  Patient presents with  . Cough    This visit occurred during the SARS-CoV-2 public health emergency.  Safety protocols were in place, including screening questions prior to the visit, additional usage of staff PPE, and extensive cleaning of exam room while observing appropriate contact time as indicated for disinfecting solutions.   Subjective:   Richard Grimes is a 69 y.o. very pleasant male patient with Body mass index is 30.13 kg/m. who presents with the following:  Richard Grimes is a very nice guy who previously was a heavy smoker and now he does have some significant COPD.  He was diagnosed with Covid more than 3 weeks ago and he did receive an antibiotic infusion.  He also has been given a couple of rounds of antibiotics and a round of prednisone.  He is here today in follow-up for a physical exam, discussion, and he did have some concern because he was coughing up a small amount of blood as well.  He has had a CT of his chest in February 2021.  There was a left upper lobe pulmonary nodule, but aside from this there was no new malignancy.  55-month follow-up was recommended.  Did zithromax 500 mg x 10 days, doxy 200 mg po x 10 days, pred x 15 - 5th of oct, Ivermectin 16 - 25  He did lose his smell.   Lost memory. 106 temp  As was given his history and realize that the patient did not have a positive Covid test and he still was coughing up some blood, I discussed this case with Richard Grimes, and we both decided to have the patient leave our office.  Subsequently I did talk to the patient about 15 feet apart both  wearing masks in the parking lot.  He has certainly been treated if not ever treated for COVID-19 without a diagnosis including IV infusion.  He has been on them extended courses of oral steroids at all with his COPD.  This is also blind flared up.  Today he is breathing somewhat better but he has been coughing up blood.  He has been under CT surveillance for history of heavy smoking.  Review of Systems is noted in the HPI, as appropriate  Objective:   BP 110/70   Pulse 82   Temp 97.7 F (36.5 C) (Temporal)   Ht 5' 6.5" (1.689 m)   Wt 189 lb 8 oz (86 kg)   SpO2 97%   BMI 30.13 kg/m   GEN: No acute distress; alert,appropriate. PULM: Breathing comfortably in no respiratory distress PSYCH: Normally interactive.   Laboratory and Imaging Data: DG Chest 2 View  Result Date: 04/08/2020 CLINICAL DATA:  Cough and hemoptysis EXAM: CHEST - 2 VIEW COMPARISON:  08/21/2019 FINDINGS: Cardiac shadows within normal limits. Some nodular changes are identified in both lungs which were not seen on the prior exam and suspicious for underlying neoplasm. Some patchy airspace opacities are noted bilaterally. No bony abnormality is noted. IMPRESSION: New nodular densities increased when compared with prior CT examination from 8 months previous. Given the patient's clinical history these are suspicious for neoplastic  involvement. CT of the chest with contrast is recommended for further evaluation. Patchy airspace opacities which may be related to the underlying nodularity although the possibility of multifocal pneumonia would deserve consideration. Correlation with COVID-19 testing is recommended. Electronically Signed   By: Inez Catalina M.D.   On: 04/08/2020 21:54     Assessment and Plan:     ICD-10-CM   1. Coughing blood  R04.2 DG Chest 2 View    CANCELED: DG Chest 2 View  2. Panlobular emphysema (HCC)  J43.1 DG Chest 2 View    CANCELED: DG Chest 2 View  3. Fever in other diseases  R50.81 DG Chest 2  View  4. Abnormal chest x-ray  R93.89   5. Lung mass  R91.8    Total encounter time: 30 minutes. This includes total time spent on the day of encounter.  Additional time spent on discussing with the patient across the parking lot, explaining why I did not think it was appropriate for him to be in the examination room, discussing of test findings of his chest x-ray and additional testing warranted to evaluate for potential neoplasm.  He understands all of this in its entirety.  We also followed up with 3 separate my chart messages.  Obtain a CT of the chest with contrast.  Clinical history of hemoptysis.  New nodular densities increased with compared to prior CT 8 months ago with concerns for potential neoplasm.  Very heavy smoking history.  He also works in a Banker business with farms.  Evaluate for neoplasm.  No orders of the defined types were placed in this encounter.  Medications Discontinued During This Encounter  Medication Reason  . aspirin 81 MG tablet Change in therapy  . nystatin cream (MYCOSTATIN) Duplicate   Orders Placed This Encounter  Procedures  . DG Chest 2 View    Follow-up: No follow-ups on file.  Signed,  Maud Deed. Shavontae Gibeault, MD   Outpatient Encounter Medications as of 04/07/2020  Medication Sig  . aspirin 325 MG tablet Take 325 mg by mouth daily.  Marland Kitchen atorvastatin (LIPITOR) 10 MG tablet Take 1 tablet (10 mg total) by mouth daily.  Marland Kitchen nystatin cream (MYCOSTATIN) Apply 1 application topically 2 (two) times daily as needed for dry skin.  Marland Kitchen omeprazole (PRILOSEC) 20 MG capsule TAKE 1 CAPSULE BY MOUTH TWICE DAILY BEFORE MEAL(S)  . PROVENTIL HFA 108 (90 Base) MCG/ACT inhaler INHALE TWO PUFFS BY MOUTH EVERY 6 HOURS AS NEEDED FOR WHEEZING OR  SHORTNESS  OF  BREATH  . TRELEGY ELLIPTA 100-62.5-25 MCG/INH AEPB INHALE 1 PUFF ONCE DAILY  . triamcinolone cream (KENALOG) 0.1 % Apply 1 application topically 2 (two) times daily as needed.  . [DISCONTINUED] nystatin cream  (MYCOSTATIN) Apply 1 application topically 2 (two) times daily. (Patient taking differently: Apply 1 application topically 2 (two) times daily as needed. )  . [DISCONTINUED] aspirin 81 MG tablet Take 81 mg by mouth daily.   No facility-administered encounter medications on file as of 04/07/2020.

## 2020-04-07 NOTE — Telephone Encounter (Signed)
Can we get him an appointment today face to face in the office.   (he is more than 21 days after covid, and he had the antibody infusion, so he is fine to come in)

## 2020-04-08 ENCOUNTER — Ambulatory Visit (HOSPITAL_COMMUNITY)
Admission: RE | Admit: 2020-04-08 | Discharge: 2020-04-08 | Disposition: A | Discharge status: Home / Self Care | Payer: Medicare Other | Source: Ambulatory Visit | Attending: Family Medicine | Admitting: Family Medicine

## 2020-04-08 DIAGNOSIS — R042 Hemoptysis: Secondary | ICD-10-CM | POA: Diagnosis not present

## 2020-04-08 DIAGNOSIS — J431 Panlobular emphysema: Secondary | ICD-10-CM | POA: Diagnosis not present

## 2020-04-08 DIAGNOSIS — R5081 Fever presenting with conditions classified elsewhere: Secondary | ICD-10-CM

## 2020-04-08 DIAGNOSIS — J189 Pneumonia, unspecified organism: Secondary | ICD-10-CM | POA: Diagnosis not present

## 2020-04-08 DIAGNOSIS — R059 Cough, unspecified: Secondary | ICD-10-CM | POA: Diagnosis not present

## 2020-04-09 ENCOUNTER — Encounter: Payer: Self-pay | Admitting: Family Medicine

## 2020-04-11 ENCOUNTER — Other Ambulatory Visit: Payer: Self-pay | Admitting: Family Medicine

## 2020-04-11 ENCOUNTER — Encounter: Payer: Self-pay | Admitting: Family Medicine

## 2020-04-11 DIAGNOSIS — Z87891 Personal history of nicotine dependence: Secondary | ICD-10-CM

## 2020-04-11 DIAGNOSIS — R918 Other nonspecific abnormal finding of lung field: Secondary | ICD-10-CM

## 2020-04-11 DIAGNOSIS — R059 Cough, unspecified: Secondary | ICD-10-CM

## 2020-04-11 NOTE — Progress Notes (Signed)
Chest ct is order, concern for lung neoplasm.  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 Asking him verbally about appt time would be helpful, but this is relatively urgent. (eval for neoplasm)

## 2020-04-11 NOTE — Progress Notes (Signed)
Noted  see referral notes

## 2020-04-21 ENCOUNTER — Ambulatory Visit
Admission: RE | Admit: 2020-04-21 | Discharge: 2020-04-21 | Disposition: A | Discharge status: Home / Self Care | Payer: Medicare Other | Source: Ambulatory Visit | Attending: Family Medicine | Admitting: Family Medicine

## 2020-04-21 ENCOUNTER — Other Ambulatory Visit: Payer: Self-pay | Admitting: Family Medicine

## 2020-04-21 ENCOUNTER — Other Ambulatory Visit: Payer: Self-pay

## 2020-04-21 DIAGNOSIS — R9389 Abnormal findings on diagnostic imaging of other specified body structures: Secondary | ICD-10-CM | POA: Insufficient documentation

## 2020-04-21 DIAGNOSIS — R059 Cough, unspecified: Secondary | ICD-10-CM

## 2020-04-21 DIAGNOSIS — Z87891 Personal history of nicotine dependence: Secondary | ICD-10-CM | POA: Insufficient documentation

## 2020-04-21 DIAGNOSIS — R042 Hemoptysis: Secondary | ICD-10-CM

## 2020-04-21 DIAGNOSIS — R918 Other nonspecific abnormal finding of lung field: Secondary | ICD-10-CM | POA: Diagnosis not present

## 2020-04-21 DIAGNOSIS — J431 Panlobular emphysema: Secondary | ICD-10-CM | POA: Insufficient documentation

## 2020-04-21 HISTORY — DX: Unspecified asthma, uncomplicated: J45.909

## 2020-04-21 LAB — POCT I-STAT CREATININE: Creatinine, Ser: 0.8 mg/dL (ref 0.61–1.24)

## 2020-04-21 MED ORDER — IOHEXOL 300 MG/ML  SOLN
75.0000 mL | Freq: Once | INTRAMUSCULAR | Status: AC | PRN
  Administered 2020-04-21: 75 mL via INTRAVENOUS

## 2020-04-21 NOTE — Telephone Encounter (Signed)
Last office visit 04/07/20 coughing blood.  Medication listed as historical medication.  Ok to refill?

## 2020-04-22 ENCOUNTER — Telehealth: Payer: Self-pay

## 2020-04-22 ENCOUNTER — Encounter: Payer: Self-pay | Admitting: Family Medicine

## 2020-04-22 DIAGNOSIS — R918 Other nonspecific abnormal finding of lung field: Secondary | ICD-10-CM

## 2020-04-22 NOTE — Addendum Note (Signed)
Addended by: Vanessa Barbara on: 04/22/2020 02:19 PM   Modules accepted: Orders

## 2020-04-22 NOTE — Telephone Encounter (Signed)
Called and spoke with Dr. Frederico Hamman Copland [phone] who would like to talk to Dr. Vaughan Browner in regards to this patient who probably has lung cancer. Also needing to schedule him for a consult wanting him seen ASAP for Lung Cancer but not before speaking to Dr. Vaughan Browner.  Dr. Vaughan Browner please call Dr. Lorelei Pont at the number above to discuss patient

## 2020-04-22 NOTE — Telephone Encounter (Signed)
I called and spoke with Dr. Edilia Bo  CT scan reviewed.  There are new changes of bilateral lung nodules since his earlier scan in February 2021.  Findings suggestive of malignancy.  May be metastatic from extrapulmonary source.  Please order stat PET scan and get super D disc made of this CT chest. He may need an navigational bronchoscopy based on review of PET scan.  Marshell Garfinkel MD [place] 04/22/2020, 1:07 PM

## 2020-04-22 NOTE — Telephone Encounter (Signed)
Faxed request to Richard Grimes at [facility] requesting disk of super D be mailed/curriered/interoffice mailed to our office.  Nothing further needed.

## 2020-04-22 NOTE — Telephone Encounter (Signed)
PET scan put in as Stat.  Called [facility] CT, spoke with Suezanne Jacquet, requested a super D disc made of his CT chest.  Suezanne Jacquet stated he would see what he could do and would call me back and let me know the outcome.

## 2020-04-22 NOTE — Addendum Note (Signed)
Addended by: Vanessa Barbara on: 04/22/2020 02:41 PM   Modules accepted: Orders

## 2020-04-26 ENCOUNTER — Ambulatory Visit
Admission: RE | Admit: 2020-04-26 | Discharge: 2020-04-26 | Disposition: A | Discharge status: Home / Self Care | Payer: Medicare Other | Source: Ambulatory Visit | Attending: Pulmonary Disease | Admitting: Pulmonary Disease

## 2020-04-26 ENCOUNTER — Other Ambulatory Visit: Payer: Self-pay

## 2020-04-26 DIAGNOSIS — C7800 Secondary malignant neoplasm of unspecified lung: Secondary | ICD-10-CM | POA: Diagnosis not present

## 2020-04-26 DIAGNOSIS — R918 Other nonspecific abnormal finding of lung field: Secondary | ICD-10-CM | POA: Diagnosis not present

## 2020-04-26 LAB — GLUCOSE, CAPILLARY: Glucose-Capillary: 100 mg/dL — ABNORMAL HIGH (ref 70–99)

## 2020-04-26 MED ORDER — FLUDEOXYGLUCOSE F - 18 (FDG) INJECTION
9.8000 | Freq: Once | INTRAVENOUS | Status: AC | PRN
  Administered 2020-04-26: 9.927 via INTRAVENOUS

## 2020-04-27 ENCOUNTER — Telehealth: Payer: Self-pay | Admitting: Pulmonary Disease

## 2020-04-27 DIAGNOSIS — R918 Other nonspecific abnormal finding of lung field: Secondary | ICD-10-CM

## 2020-04-27 NOTE — Telephone Encounter (Signed)
Pt returning missed call. Can be reached at [phone]

## 2020-04-27 NOTE — Telephone Encounter (Signed)
Lm for patient.  

## 2020-04-27 NOTE — Telephone Encounter (Signed)
Patient wanted to make Dr. Vaughan Browner aware that he has been scheduled with Dr. Valeta Harms on 05/02/2020. He stated that he spoke with Dr. Valeta Harms via telephone this morning.

## 2020-04-27 NOTE — Telephone Encounter (Signed)
PCCM:  Case discussed with Dr. Vaughan Browner. Recommending ENB for tissue sampling.   Orders placed to try to schedule for Tuesday of next week. If this doesn't work for patient then we can try to schedule with RB next Thursday.   Please schedule appt for Monday 11/1 in office with me, ok for 31min slot if that's all that is available.   Plan for bronchoscopy on 11/2. Orders placed   I called and spoke with the patient regarding procedure and PET scan results.    Richard Nash, DO [place] [facility] 04/27/2020 10:34 AM

## 2020-04-27 NOTE — Telephone Encounter (Signed)
Patient has been scheduled for appointment with Dr. Valeta Harms on 05/02/20 and Bronch on 05/03/20. Patient is aware. Nothing further needed at this time.

## 2020-04-28 ENCOUNTER — Telehealth: Payer: Self-pay | Admitting: Pulmonary Disease

## 2020-04-28 DIAGNOSIS — Z0120 Encounter for dental examination and cleaning without abnormal findings: Secondary | ICD-10-CM | POA: Diagnosis not present

## 2020-04-28 NOTE — Telephone Encounter (Signed)
Pt scheduled 04/30/20 for covid and enb 05/03/20 also has appt to see dr icard 05/02/20 and he is aware of these appts Joellen Jersey

## 2020-04-29 NOTE — Telephone Encounter (Signed)
Thanks for the update.  I called and spoke with him as well

## 2020-04-30 ENCOUNTER — Other Ambulatory Visit (HOSPITAL_COMMUNITY)
Admission: RE | Admit: 2020-04-30 | Discharge: 2020-04-30 | Disposition: A | Discharge status: Home / Self Care | Payer: Medicare Other | Source: Ambulatory Visit | Attending: Pulmonary Disease | Admitting: Pulmonary Disease

## 2020-04-30 DIAGNOSIS — Z01812 Encounter for preprocedural laboratory examination: Secondary | ICD-10-CM | POA: Diagnosis not present

## 2020-04-30 DIAGNOSIS — Z20822 Contact with and (suspected) exposure to covid-19: Secondary | ICD-10-CM | POA: Diagnosis not present

## 2020-05-01 LAB — SARS CORONAVIRUS 2 (TAT 6-24 HRS): SARS Coronavirus 2: NEGATIVE

## 2020-05-02 ENCOUNTER — Telehealth: Payer: Self-pay | Admitting: Pulmonary Disease

## 2020-05-02 ENCOUNTER — Encounter (HOSPITAL_COMMUNITY): Payer: Self-pay | Admitting: Pulmonary Disease

## 2020-05-02 ENCOUNTER — Other Ambulatory Visit: Payer: Self-pay

## 2020-05-02 ENCOUNTER — Ambulatory Visit: Payer: Medicare Other | Admitting: Pulmonary Disease

## 2020-05-02 ENCOUNTER — Encounter: Payer: Self-pay | Admitting: Pulmonary Disease

## 2020-05-02 VITALS — BP 138/88 | HR 86 | Temp 97.1°F | Ht 67.0 in | Wt 191.4 lb

## 2020-05-02 DIAGNOSIS — R942 Abnormal results of pulmonary function studies: Secondary | ICD-10-CM

## 2020-05-02 DIAGNOSIS — R918 Other nonspecific abnormal finding of lung field: Secondary | ICD-10-CM | POA: Diagnosis not present

## 2020-05-02 NOTE — Progress Notes (Signed)
Patient denies shortness of breath, fever, cough or chest pain.  PCP - Dr Frederico Hamman Copland Cardiologist - Dr Barry Dienes - Dr Wilfrid Lund Pulmonology -  Dr Marshell Garfinkel  Chest x-ray - 04/08/20 (2V), CT chest 04/21/20 EKG - 09/21/19 Stress Test - n/a ECHO - n/a Cardiac Cath - 2000  Anesthesia review: Yes  STOP now taking any Aspirin (unless otherwise instructed by your surgeon), Aleve, Naproxen, Ibuprofen, Motrin, Advil, Goody's, BC's, all herbal medications, fish oil, and all vitamins.   Coronavirus Screening Covid test on 04/30/20 was negative.  Patient verbalized understanding of instructions that were given via phone.

## 2020-05-02 NOTE — Anesthesia Preprocedure Evaluation (Addendum)
Anesthesia Evaluation  Patient identified by MRN, date of birth, ID band Patient awake    Reviewed: Allergy & Precautions, NPO status , Patient's Chart, lab work & pertinent test results  Airway Mallampati: II  TM Distance: >3 FB Neck ROM: Full    Dental no notable dental hx.    Pulmonary asthma , COPD,  COPD inhaler, Patient abstained from smoking., former smoker,    Pulmonary exam normal breath sounds clear to auscultation       Cardiovascular negative cardio ROS Normal cardiovascular exam Rhythm:Regular Rate:Normal     Neuro/Psych negative neurological ROS  negative psych ROS   GI/Hepatic Neg liver ROS, GERD  ,  Endo/Other  negative endocrine ROS  Renal/GU negative Renal ROS  negative genitourinary   Musculoskeletal negative musculoskeletal ROS (+)   Abdominal   Peds negative pediatric ROS (+)  Hematology negative hematology ROS (+)   Anesthesia Other Findings   Reproductive/Obstetrics negative OB ROS                            Anesthesia Physical Anesthesia Plan  ASA: III  Anesthesia Plan: General   Post-op Pain Management:    Induction: Intravenous  PONV Risk Score and Plan: 2 and Ondansetron, Dexamethasone and Treatment may vary due to age or medical condition  Airway Management Planned: Oral ETT  Additional Equipment:   Intra-op Plan:   Post-operative Plan: Extubation in OR  Informed Consent: I have reviewed the patients History and Physical, chart, labs and discussed the procedure including the risks, benefits and alternatives for the proposed anesthesia with the patient or authorized representative who has indicated his/her understanding and acceptance.     Dental advisory given  Plan Discussed with: CRNA and Surgeon  Anesthesia Plan Comments: (PAT note written 05/02/2020 by Myra Gianotti, PA-C. )       Anesthesia Quick Evaluation

## 2020-05-02 NOTE — Progress Notes (Signed)
Anesthesia Chart Review: Richard Grimes   Case: [id number] Date/Time: 05/03/20 1505   Procedure: VIDEO BRONCHOSCOPY WITH ENDOBRONCHIAL NAVIGATION (Right )   Anesthesia type: General   Pre-op diagnosis: LUNG NODULE RIGHT   Location: [facility]   Surgeons: Garner Nash, DO      DISCUSSION: Patient is a 69 year old male scheduled for the above procedure. He has pulmonology nodules on imaging with rapid progression on 04/21/20 PET Scan. Differential includes infectious etiology or primary bronchogenic carcinoma with diffuse pulmonary metastasis. He was treated for COVID-19 in 03/2020 (monoclonal antibodies 03/23/20, zithromax, doxycycline, prednisone, ivermectin), but primary care notes suggest he did not have a confirmatory test but I did not confirm this. His COVID-19 test was negative on 04/30/20.     History includes former smoker (quit 07/02/04), COPD, GERD, iron deficiency anemia, HLD, asthma.  He is followed by cardiologist Dr. Rockey Situ for coronary calcifications on chest CT, 65-month follow-up recommended at 09/21/2019 visit.  As above it appears he received casirivimab-imdevimab (Regen-COV) on 03/23/20 after either + COVID-19 test, COVID-like illness, or positive exposure (no positive test seen in our system). Negative test 04/30/20.    He is a same-day work-up, so anesthesia team evaluation and labs on the day of surgery.   VS:  BP Readings from Last 3 Encounters:  05/02/20 138/88  04/07/20 110/70  03/23/20 115/75   Pulse Readings from Last 3 Encounters:  05/02/20 86  04/07/20 82  03/23/20 82     PROVIDERS: [name], MD is PCP Marshell Garfinkel, MD is pulmonologist Ida Rogue, MD is cardiologist. Initially evaluated on 09/18/17 for finding of aortic and coronary calcifications on chest CT, and would consider testing should he become symptoms. No testing ordered at 09/21/19 visit with 12 month follow-up planned.    LABS: For day of surgery. As of  08/19/19, Cr 0.90, glucose 111, CBC WNL. Cr 0.80 04/21/20.   IMAGES: PET Scan 04/26/20: IMPRESSION: 1. Multifocal FDG avid pulmonary nodules are again seen scattered throughout both lungs. The largest and most FDG avid are in the left upper lobe. The diagnosis of exclusion is primary bronchogenic carcinoma with diffuse pulmonary metastasis. However, given the rapid interval development of these nodules (from 08/21/2019) infectious etiologies are also a differential consideration. Furthermore, the subpleural lesion within the posterior left upper lobe appears decreased in size from 04/21/2020 which would be unusual for malignancy. Correlation with tissue sampling is recommended. 2. Small mediastinal lymph nodes are identified which exhibit mild low level FDG uptake. 3. No signs of metastatic disease to the neck, abdomen or pelvis. No osseous metastasis identified  CT Chest 04/21/20: IMPRESSION: 1. Underlying emphysematous change. Nodular opacities noted throughout the lungs, likely of neoplastic etiology. A nodular lesion in the left upper lobe anteriorly shows cavitation. No other cavitated lesions evident. This finding may well warrant bronchoscopic evaluation with cytologic assessment. 2. Multiple subcentimeter mediastinal lymph nodes. Single borderline prominent right hilar lymph node which may well have neoplastic etiology given the findings within the lungs. 3.  There is a degree of lower lobe bronchiectatic change. 4. Foci of aortic atherosclerosis and coronary artery calcification. 5.  Suspect adherent gallstone along the fundus of the gallbladder.  CXR 04/08/20: IMPRESSION: - New nodular densities increased when compared with prior CT examination from 8 months previous. Given the patient's clinical history these are suspicious for neoplastic involvement. CT of the chest with contrast is recommended for further evaluation. - Patchy airspace opacities which may be  related to the underlying nodularity although the possibility of multifocal pneumonia would deserve consideration. Correlation with COVID-19 testing is recommended.   EKG: EKG 09/21/19 ([facility]): Normal sinus rhythm Right bundle branch block   CV: Reported normal coronaries by cardiac cath in 2000. He is followed by Dr. Rockey Situ for mild calcification in the LAD and Cx on CT imaging. Would consider Myoview if he developed symptoms per 09/18/17 note. No new testing ordered at 09/21/19 visit.   Past Medical History:  Diagnosis Date  . Allergic rhinitis due to pollen 07/18/2011  . Allergy   . Asthma   . COPD (chronic obstructive pulmonary disease) (HCC)    Severe, based on Spirometry.   . Depression   . Family history of colonic polyps   . Family history of lung cancer   . Family history of lymphoma   . Family history of melanoma   . Family history of skin cancer   . GERD (gastroesophageal reflux disease)   . History of colonic polyps 11/17/2019  . Hx of adenomatous colonic polyps   . Hyperlipidemia   . Iron deficiency anemia     Past Surgical History:  Procedure Laterality Date  . CARDIAC CATHETERIZATION  2000   Normal coronaries  . COLONOSCOPY     [id number]  . [place] and 1999  . POLYPECTOMY    . UPPER GASTROINTESTINAL ENDOSCOPY      MEDICATIONS: No current facility-administered medications for this encounter.   Marland Kitchen aspirin 325 MG tablet  . atorvastatin (LIPITOR) 10 MG tablet  . ECHINACEA EXTRACT PO  . L-Arginine 500 MG CAPS  . levOCARNitine L-Tartrate (L-CARNITINE) 500 MG CAPS  . Multiple Minerals-Vitamins (CAL-MAG-ZINC-D) TABS  . Multiple Vitamins-Minerals (MULTIVITAMIN WITH MINERALS) tablet  . nystatin cream (MYCOSTATIN)  . omeprazole (PRILOSEC) 20 MG capsule  . PROVENTIL HFA 108 (90 Base) MCG/ACT inhaler  . TRELEGY ELLIPTA 100-62.5-25 MCG/INH AEPB  . triamcinolone cream (KENALOG) 0.1 %  . Turmeric Curcumin 500 MG CAPS    Myra Gianotti, PA-C Surgical Short Stay/Anesthesiology [facility] Phone [phone] [facility] Phone [phone] 05/02/2020 2:57 PM

## 2020-05-02 NOTE — H&P (View-Only) (Signed)
Synopsis: Referred in May 02, 2020 for abnormal PET by [name], MD  Subjective:   PATIENT ID: Richard Grimes GENDER: male DOB: Nov 14, 1950, MRN: [id number]  Chief Complaint  Patient presents with  . Follow-up    Patient is here to discuss procedure for tomorrow    This is a 69 year old gentleman longstanding history of smoking.  Former tobacco abuse quit a few years ago.  Had Covid in September 2021.  He was relatively sick at home high-grade fevers.  Never hospitalized.  Patient ultimately had ongoing cough and shortness of breath.  He received a CT scan of the chest with primary care.  CT was completed in October 2021.  CT scan of the chest revealed evidence of emphysema with multiple nodular lesions.  The left upper lobe lesion anterior shows early cavitation.  Also multiple subcentimeter nodules in the lower lobes.  Additionally there is a 3.2 x 1.8 cm pleural-based lesion.  Patient underwent nuclear medicine pet imaging approximately 5 days later.  The posterior lateral left upper lobe lesion has an SUV of 7.5, and the dominant nodule within the lingula of SUV of 7.7, the right upper lobe nodule at 1.4 cm has SUV max of 4.2.  There is a large multifocal PET avid disease concerning for lung cancer with diffuse pulmonary metastasis.  Possible infectious etiologies not excluded due to rapid progression and change.  1 should consider other inflammatory collagen vascular disease or immune mediated diseases.  OV 05/02/2020: I met with the patient and his wife today in the office to discuss bronchoscopy.  We discussed this via phone the other day for perioperative planning.  Here today to review any questions and discussed the risk benefits and alternatives of proceeding.  Patient is agreeable to proceed with no barriers at this time.  His COPD is currently managed with Trelegy.  Compliant with his inhaler.  Denies hemoptysis.  Unfortunately he is still having ongoing nocturnal night sweats.   These are usually bad enough to make him change close in the middle of the night.  Weight has overall been stable.   Past Medical History:  Diagnosis Date  . Allergic rhinitis due to pollen 07/18/2011  . Allergy   . Asthma   . COPD (chronic obstructive pulmonary disease) (HCC)    Severe, based on Spirometry.   . Depression   . Family history of colonic polyps   . Family history of lung cancer   . Family history of lymphoma   . Family history of melanoma   . Family history of skin cancer   . GERD (gastroesophageal reflux disease)   . History of colonic polyps 11/17/2019  . Hx of adenomatous colonic polyps   . Hyperlipidemia   . Iron deficiency anemia      Family History  Problem Relation Age of Onset  . Pulmonary fibrosis Mother   . GER disease Mother   . Hypertension Mother   . Colon polyps Mother   . Hypertension Father   . Melanoma Father 26       lower back  . Skin cancer Father 64       lip  . Alzheimer's disease Maternal Grandmother   . Deep vein thrombosis Maternal Grandfather   . Lymphoma Maternal Grandfather 47  . Deep vein thrombosis Paternal Grandmother   . Deep vein thrombosis Paternal Grandfather   . Skin cancer Paternal Grandfather        lip; dx. in his 54s  . Colon polyps Maternal  Uncle   . Lung cancer Cousin 89       smoker (maternal cousin)  . Colon cancer Neg Hx   . Esophageal cancer Neg Hx   . Rectal cancer Neg Hx   . Stomach cancer Neg Hx      Past Surgical History:  Procedure Laterality Date  . CARDIAC CATHETERIZATION  2000   Normal coronaries  . COLONOSCOPY     [id number]  . [place] and 1999  . POLYPECTOMY    . UPPER GASTROINTESTINAL ENDOSCOPY      Social History   Socioeconomic History  . Marital status: Married    Spouse name: [name]  . Number of children: Not on file  . Years of education: Not on file  . Highest education level: Not on file  Occupational History  . Occupation: fumigation     Employer: [employer]  Tobacco Use  . Smoking status: Former Smoker    Packs/day: 2.00    Years: 35.00    Pack years: 70.00    Types: Cigarettes    Quit date: 07/02/2004    Years since quitting: 15.8  . Smokeless tobacco: Never Used  Vaping Use  . Vaping Use: Never used  Substance and Sexual Activity  . Alcohol use: Yes    Alcohol/week: 0.0 standard drinks    Comment: occ glass on wine, or beer  . Drug use: No  . Sexual activity: Yes    Partners: Female  Other Topics Concern  . Not on file  Social History Narrative  . Not on file   Social Determinants of Health   Financial Resource Strain: Low Risk   . Difficulty of Paying Living Expenses: Not hard at all  Food Insecurity: No Food Insecurity  . Worried About Charity fundraiser in the Last Year: Never true  . Ran Out of Food in the Last Year: Never true  Transportation Needs: No Transportation Needs  . Lack of Transportation (Medical): No  . Lack of Transportation (Non-Medical): No  Physical Activity: Inactive  . Days of Exercise per Week: 0 days  . Minutes of Exercise per Session: 0 min  Stress: No Stress Concern Present  . Feeling of Stress : Not at all  Social Connections:   . Frequency of Communication with Friends and Family: Not on file  . Frequency of Social Gatherings with Friends and Family: Not on file  . Attends Religious Services: Not on file  . Active Member of Clubs or Organizations: Not on file  . Attends Archivist Meetings: Not on file  . Marital Status: Not on file  Intimate Partner Violence: Not At Risk  . Fear of Current or Ex-Partner: No  . Emotionally Abused: No  . Physically Abused: No  . Sexually Abused: No     Allergies  Allergen Reactions  . Penicillins Rash    Reaction: 50 Years ago     Outpatient Medications Prior to Visit  Medication Sig Dispense Refill  . aspirin 325 MG tablet Take 325 mg by mouth daily.    Marland Kitchen atorvastatin (LIPITOR) 10 MG tablet Take 1 tablet (10  mg total) by mouth daily. 30 tablet 11  . ECHINACEA EXTRACT PO Take 1,520 mg by mouth at bedtime. 760 mg each     . L-Arginine 500 MG CAPS Take 1,000 mg by mouth at bedtime.    . levOCARNitine L-Tartrate (L-CARNITINE) 500 MG CAPS Take 500 mg by mouth at bedtime.    Marland Kitchen  Multiple Minerals-Vitamins (CAL-MAG-ZINC-D) TABS Take 1,000 mg by mouth at bedtime.     . Multiple Vitamins-Minerals (MULTIVITAMIN WITH MINERALS) tablet Take 1 tablet by mouth daily. Centrum Silver men 50+    . nystatin cream (MYCOSTATIN) APPLY CREAM TOPICALLY TWICE DAILY (Patient taking differently: Apply 1 application topically 3 (three) times a week. ) 30 g 0  . omeprazole (PRILOSEC) 20 MG capsule TAKE 1 CAPSULE BY MOUTH TWICE DAILY BEFORE MEAL(S) (Patient taking differently: Take 40 mg by mouth daily. ) 180 capsule 0  . PROVENTIL HFA 108 (90 Base) MCG/ACT inhaler INHALE TWO PUFFS BY MOUTH EVERY 6 HOURS AS NEEDED FOR WHEEZING OR  SHORTNESS  OF  BREATH (Patient taking differently: Inhale 2 puffs into the lungs every 6 (six) hours as needed for wheezing or shortness of breath. ) 7 each 3  . TRELEGY ELLIPTA 100-62.5-25 MCG/INH AEPB INHALE 1 PUFF ONCE DAILY (Patient taking differently: Inhale 1 puff into the lungs daily. ) 180 each 3  . triamcinolone cream (KENALOG) 0.1 % Apply 1 application topically 2 (two) times daily as needed. (Patient taking differently: Apply 1 application topically 3 (three) times a week. ) 454 g 0  . Turmeric Curcumin 500 MG CAPS Take 500 mg by mouth at bedtime.     No facility-administered medications prior to visit.    Review of Systems  Constitutional: Negative for chills, fever, malaise/fatigue and weight loss.  HENT: Negative for hearing loss, sore throat and tinnitus.   Eyes: Negative for blurred vision and double vision.  Respiratory: Positive for cough, hemoptysis and shortness of breath. Negative for sputum production, wheezing and stridor.   Cardiovascular: Negative for chest pain, palpitations,  orthopnea, leg swelling and PND.  Gastrointestinal: Negative for abdominal pain, constipation, diarrhea, heartburn, nausea and vomiting.  Genitourinary: Negative for dysuria, hematuria and urgency.  Musculoskeletal: Negative for joint pain and myalgias.  Skin: Negative for itching and rash.  Neurological: Negative for dizziness, tingling, weakness and headaches.  Endo/Heme/Allergies: Negative for environmental allergies. Does not bruise/bleed easily.  Psychiatric/Behavioral: Negative for depression. The patient is not nervous/anxious and does not have insomnia.   All other systems reviewed and are negative.    Objective:  Physical Exam Vitals reviewed.  Constitutional:      General: He is not in acute distress.    Appearance: He is well-developed.  HENT:     Head: Normocephalic and atraumatic.  Eyes:     General: No scleral icterus.    Conjunctiva/sclera: Conjunctivae normal.     Pupils: Pupils are equal, round, and reactive to light.  Neck:     Vascular: No JVD.     Trachea: No tracheal deviation.  Cardiovascular:     Rate and Rhythm: Normal rate and regular rhythm.     Heart sounds: Normal heart sounds. No murmur heard.   Pulmonary:     Effort: Pulmonary effort is normal. No tachypnea, accessory muscle usage or respiratory distress.     Breath sounds: Normal breath sounds. No stridor. No wheezing, rhonchi or rales.  Abdominal:     General: Bowel sounds are normal. There is no distension.     Palpations: Abdomen is soft.     Tenderness: There is no abdominal tenderness.  Musculoskeletal:        General: No tenderness.     Cervical back: Neck supple.  Lymphadenopathy:     Cervical: No cervical adenopathy.  Skin:    General: Skin is warm and dry.     Capillary Refill: Capillary refill  takes less than 2 seconds.     Findings: No rash.  Neurological:     Mental Status: He is alert and oriented to person, place, and time.  Psychiatric:        Behavior: Behavior normal.        Vitals:   05/02/20 1352  BP: 138/88  Pulse: 86  Temp: (!) 97.1 F (36.2 C)  TempSrc: Temporal  SpO2: 99%  Weight: 191 lb 6.4 oz (86.8 kg)  Height: '5\' 7"'  (1.702 m)   99% on RA BMI Readings from Last 3 Encounters:  05/02/20 29.98 kg/m  04/07/20 30.13 kg/m  11/05/19 32.28 kg/m   Wt Readings from Last 3 Encounters:  05/02/20 191 lb 6.4 oz (86.8 kg)  04/07/20 189 lb 8 oz (86 kg)  11/05/19 200 lb (90.7 kg)     CBC    Component Value Date/Time   WBC 5.1 08/19/2019 0840   RBC 5.25 08/19/2019 0840   HGB 14.4 08/19/2019 0840   HCT 43.9 08/19/2019 0840   PLT 187.0 08/19/2019 0840   MCV 83.6 08/19/2019 0840   MCH 26.6 (L) 09/10/2016 1327   MCHC 32.8 08/19/2019 0840   RDW 14.1 08/19/2019 0840   LYMPHSABS 1.4 08/19/2019 0840   MONOABS 0.6 08/19/2019 0840   EOSABS 0.2 08/19/2019 0840   BASOSABS 0.1 08/19/2019 0840    Chest Imaging:  04/21/2020: CT chest bilateral scattered nodularity, more dominant nodule in the right, left upper lobe lesion with a cavitary nodule in the anterior aspect of more pleural-based lesion as described above.  Small mediastinal adenopathy. The patient's images have been independently reviewed by me.    04/26/2020 nuclear medicine pet imaging PET avid lesions within the left upper and lower lobes bilaterally.  Concerning for inflammatory disease versus metastatic malignancy. The patient's images have been independently reviewed by me.    Pulmonary Functions Testing Results: PFT Results Latest Ref Rng & Units 09/23/2017  FVC-Pre L 3.77  FVC-Predicted Pre % 90  FVC-Post L 3.74  FVC-Predicted Post % 89  Pre FEV1/FVC % % 49  Post FEV1/FCV % % 53  FEV1-Pre L 1.84  FEV1-Predicted Pre % 59  FEV1-Post L 1.97  DLCO uncorrected ml/min/mmHg 16.04  DLCO UNC% % 55  DLVA Predicted % 56  TLC L 7.08  TLC % Predicted % 108  RV % Predicted % 120    FeNO:   Pathology:   Echocardiogram:   Heart Catheterization:     Assessment & Plan:      ICD-10-CM   1. Abnormal CT scan of lung  R91.8   2. Abnormal PET scan of lung  R94.2   3. Multiple lung nodules  R91.8    Discussion:  This is a 69 year old gentleman history of COVID-19.  Ultimately had follow-up CT imaging of the chest due to persistent cough shortness of breath and hemoptysis.  Found to have multiple bilateral lung nodules and small areas with early cavitation concerning for malignancy versus diffuse pulmonary metastasis.  We reviewed these images today with the patient as well as the patient's wife in office.  We discussed the CAT scan results as well as the nuclear medicine pet imaging that was completed.  I am unsure if this represents a malignancy versus a inflammatory disorder.  Other diseases such as vasculitis should be considered.  However malignancy should be ruled out and this was discussed with the patient and his wife.  Plan: Patient scheduled for navigational bronchoscopy. We discussed the risk benefits and alternatives  of proceeding with navigational bronchoscopy and tissue sampling today in the office. Patient is agreeable to proceed. Recent CT scan of the chest will be converted for super D imaging. Pending pathology results will consider recommendations for follow-up imaging or additional lab testing.   Current Outpatient Medications:  .  aspirin 325 MG tablet, Take 325 mg by mouth daily., Disp: , Rfl:  .  atorvastatin (LIPITOR) 10 MG tablet, Take 1 tablet (10 mg total) by mouth daily., Disp: 30 tablet, Rfl: 11 .  ECHINACEA EXTRACT PO, Take 1,520 mg by mouth at bedtime. 760 mg each , Disp: , Rfl:  .  L-Arginine 500 MG CAPS, Take 1,000 mg by mouth at bedtime., Disp: , Rfl:  .  levOCARNitine L-Tartrate (L-CARNITINE) 500 MG CAPS, Take 500 mg by mouth at bedtime., Disp: , Rfl:  .  Multiple Minerals-Vitamins (CAL-MAG-ZINC-D) TABS, Take 1,000 mg by mouth at bedtime. , Disp: , Rfl:  .  Multiple Vitamins-Minerals (MULTIVITAMIN WITH MINERALS) tablet, Take  1 tablet by mouth daily. Centrum Silver men 50+, Disp: , Rfl:  .  nystatin cream (MYCOSTATIN), APPLY CREAM TOPICALLY TWICE DAILY (Patient taking differently: Apply 1 application topically 3 (three) times a week. ), Disp: 30 g, Rfl: 0 .  omeprazole (PRILOSEC) 20 MG capsule, TAKE 1 CAPSULE BY MOUTH TWICE DAILY BEFORE MEAL(S) (Patient taking differently: Take 40 mg by mouth daily. ), Disp: 180 capsule, Rfl: 0 .  PROVENTIL HFA 108 (90 Base) MCG/ACT inhaler, INHALE TWO PUFFS BY MOUTH EVERY 6 HOURS AS NEEDED FOR WHEEZING OR  SHORTNESS  OF  BREATH (Patient taking differently: Inhale 2 puffs into the lungs every 6 (six) hours as needed for wheezing or shortness of breath. ), Disp: 7 each, Rfl: 3 .  TRELEGY ELLIPTA 100-62.5-25 MCG/INH AEPB, INHALE 1 PUFF ONCE DAILY (Patient taking differently: Inhale 1 puff into the lungs daily. ), Disp: 180 each, Rfl: 3 .  triamcinolone cream (KENALOG) 0.1 %, Apply 1 application topically 2 (two) times daily as needed. (Patient taking differently: Apply 1 application topically 3 (three) times a week. ), Disp: 454 g, Rfl: 0 .  Turmeric Curcumin 500 MG CAPS, Take 500 mg by mouth at bedtime., Disp: , Rfl:   I spent 60 minutes dedicated to the care of this patient on the date of this encounter to include pre-visit review of records, face-to-face time with the patient discussing conditions above, post visit ordering of testing, clinical documentation with the electronic health record, making appropriate referrals as documented, and communicating necessary findings to members of the patients care team.   Garner Nash, DO [facility] 05/02/2020 1:58 PM

## 2020-05-02 NOTE — Telephone Encounter (Signed)
Spoke to [place] with pre admit testing, who stated that this matter has been resolved. Patient made her aware of Dr. Valeta Harms instructions of NPO after midnight. I have verified this information with Dr. Valeta Harms via secure epic message.  Nothing further needed.

## 2020-05-02 NOTE — Patient Instructions (Signed)
Thank you for visiting Dr. Valeta Harms at [facility]. Today we recommend the following:  Planned bronchoscopy planned for tomorrow.  NPO at midnight   Return in about 4 weeks (around 05/30/2020) for with APP or Dr. Valeta Harms .    Please do your part to reduce the spread of COVID-19.

## 2020-05-02 NOTE — Progress Notes (Signed)
Synopsis: Referred in May 02, 2020 for abnormal PET by [name], MD  Subjective:   PATIENT ID: Richard Grimes GENDER: male DOB: 04/30/1951, MRN: [id number]  Chief Complaint  Patient presents with  . Follow-up    Patient is here to discuss procedure for tomorrow    This is a 69 year old gentleman longstanding history of smoking.  Former tobacco abuse quit a few years ago.  Had Covid in September 2021.  He was relatively sick at home high-grade fevers.  Never hospitalized.  Patient ultimately had ongoing cough and shortness of breath.  He received a CT scan of the chest with primary care.  CT was completed in October 2021.  CT scan of the chest revealed evidence of emphysema with multiple nodular lesions.  The left upper lobe lesion anterior shows early cavitation.  Also multiple subcentimeter nodules in the lower lobes.  Additionally there is a 3.2 x 1.8 cm pleural-based lesion.  Patient underwent nuclear medicine pet imaging approximately 5 days later.  The posterior lateral left upper lobe lesion has an SUV of 7.5, and the dominant nodule within the lingula of SUV of 7.7, the right upper lobe nodule at 1.4 cm has SUV max of 4.2.  There is a large multifocal PET avid disease concerning for lung cancer with diffuse pulmonary metastasis.  Possible infectious etiologies not excluded due to rapid progression and change.  1 should consider other inflammatory collagen vascular disease or immune mediated diseases.  OV 05/02/2020: I met with the patient and his wife today in the office to discuss bronchoscopy.  We discussed this via phone the other day for perioperative planning.  Here today to review any questions and discussed the risk benefits and alternatives of proceeding.  Patient is agreeable to proceed with no barriers at this time.  His COPD is currently managed with Trelegy.  Compliant with his inhaler.  Denies hemoptysis.  Unfortunately he is still having ongoing nocturnal night sweats.   These are usually bad enough to make him change close in the middle of the night.  Weight has overall been stable.   Past Medical History:  Diagnosis Date  . Allergic rhinitis due to pollen 07/18/2011  . Allergy   . Asthma   . COPD (chronic obstructive pulmonary disease) (HCC)    Severe, based on Spirometry.   . Depression   . Family history of colonic polyps   . Family history of lung cancer   . Family history of lymphoma   . Family history of melanoma   . Family history of skin cancer   . GERD (gastroesophageal reflux disease)   . History of colonic polyps 11/17/2019  . Hx of adenomatous colonic polyps   . Hyperlipidemia   . Iron deficiency anemia      Family History  Problem Relation Age of Onset  . Pulmonary fibrosis Mother   . GER disease Mother   . Hypertension Mother   . Colon polyps Mother   . Hypertension Father   . Melanoma Father 15       lower back  . Skin cancer Father 83       lip  . Alzheimer's disease Maternal Grandmother   . Deep vein thrombosis Maternal Grandfather   . Lymphoma Maternal Grandfather 68  . Deep vein thrombosis Paternal Grandmother   . Deep vein thrombosis Paternal Grandfather   . Skin cancer Paternal Grandfather        lip; dx. in his 5s  . Colon polyps Maternal  Uncle   . Lung cancer Cousin 22       smoker (maternal cousin)  . Colon cancer Neg Hx   . Esophageal cancer Neg Hx   . Rectal cancer Neg Hx   . Stomach cancer Neg Hx      Past Surgical History:  Procedure Laterality Date  . CARDIAC CATHETERIZATION  2000   Normal coronaries  . COLONOSCOPY     [id number]  . [place] and 1999  . POLYPECTOMY    . UPPER GASTROINTESTINAL ENDOSCOPY      Social History   Socioeconomic History  . Marital status: Married    Spouse name: Richard Grimes  . Number of children: Not on file  . Years of education: Not on file  . Highest education level: Not on file  Occupational History  . Occupation: fumigation     Employer: [employer]  Tobacco Use  . Smoking status: Former Smoker    Packs/day: 2.00    Years: 35.00    Pack years: 70.00    Types: Cigarettes    Quit date: 07/02/2004    Years since quitting: 15.8  . Smokeless tobacco: Never Used  Vaping Use  . Vaping Use: Never used  Substance and Sexual Activity  . Alcohol use: Yes    Alcohol/week: 0.0 standard drinks    Comment: occ glass on wine, or beer  . Drug use: No  . Sexual activity: Yes    Partners: Female  Other Topics Concern  . Not on file  Social History Narrative  . Not on file   Social Determinants of Health   Financial Resource Strain: Low Risk   . Difficulty of Paying Living Expenses: Not hard at all  Food Insecurity: No Food Insecurity  . Worried About Charity fundraiser in the Last Year: Never true  . Ran Out of Food in the Last Year: Never true  Transportation Needs: No Transportation Needs  . Lack of Transportation (Medical): No  . Lack of Transportation (Non-Medical): No  Physical Activity: Inactive  . Days of Exercise per Week: 0 days  . Minutes of Exercise per Session: 0 min  Stress: No Stress Concern Present  . Feeling of Stress : Not at all  Social Connections:   . Frequency of Communication with Friends and Family: Not on file  . Frequency of Social Gatherings with Friends and Family: Not on file  . Attends Religious Services: Not on file  . Active Member of Clubs or Organizations: Not on file  . Attends Archivist Meetings: Not on file  . Marital Status: Not on file  Intimate Partner Violence: Not At Risk  . Fear of Current or Ex-Partner: No  . Emotionally Abused: No  . Physically Abused: No  . Sexually Abused: No     Allergies  Allergen Reactions  . Penicillins Rash    Reaction: 50 Years ago     Outpatient Medications Prior to Visit  Medication Sig Dispense Refill  . aspirin 325 MG tablet Take 325 mg by mouth daily.    Marland Kitchen atorvastatin (LIPITOR) 10 MG tablet Take 1 tablet (10  mg total) by mouth daily. 30 tablet 11  . ECHINACEA EXTRACT PO Take 1,520 mg by mouth at bedtime. 760 mg each     . L-Arginine 500 MG CAPS Take 1,000 mg by mouth at bedtime.    . levOCARNitine L-Tartrate (L-CARNITINE) 500 MG CAPS Take 500 mg by mouth at bedtime.    Marland Kitchen  Multiple Minerals-Vitamins (CAL-MAG-ZINC-D) TABS Take 1,000 mg by mouth at bedtime.     . Multiple Vitamins-Minerals (MULTIVITAMIN WITH MINERALS) tablet Take 1 tablet by mouth daily. Centrum Silver men 50+    . nystatin cream (MYCOSTATIN) APPLY CREAM TOPICALLY TWICE DAILY (Patient taking differently: Apply 1 application topically 3 (three) times a week. ) 30 g 0  . omeprazole (PRILOSEC) 20 MG capsule TAKE 1 CAPSULE BY MOUTH TWICE DAILY BEFORE MEAL(S) (Patient taking differently: Take 40 mg by mouth daily. ) 180 capsule 0  . PROVENTIL HFA 108 (90 Base) MCG/ACT inhaler INHALE TWO PUFFS BY MOUTH EVERY 6 HOURS AS NEEDED FOR WHEEZING OR  SHORTNESS  OF  BREATH (Patient taking differently: Inhale 2 puffs into the lungs every 6 (six) hours as needed for wheezing or shortness of breath. ) 7 each 3  . TRELEGY ELLIPTA 100-62.5-25 MCG/INH AEPB INHALE 1 PUFF ONCE DAILY (Patient taking differently: Inhale 1 puff into the lungs daily. ) 180 each 3  . triamcinolone cream (KENALOG) 0.1 % Apply 1 application topically 2 (two) times daily as needed. (Patient taking differently: Apply 1 application topically 3 (three) times a week. ) 454 g 0  . Turmeric Curcumin 500 MG CAPS Take 500 mg by mouth at bedtime.     No facility-administered medications prior to visit.    Review of Systems  Constitutional: Negative for chills, fever, malaise/fatigue and weight loss.  HENT: Negative for hearing loss, sore throat and tinnitus.   Eyes: Negative for blurred vision and double vision.  Respiratory: Positive for cough, hemoptysis and shortness of breath. Negative for sputum production, wheezing and stridor.   Cardiovascular: Negative for chest pain, palpitations,  orthopnea, leg swelling and PND.  Gastrointestinal: Negative for abdominal pain, constipation, diarrhea, heartburn, nausea and vomiting.  Genitourinary: Negative for dysuria, hematuria and urgency.  Musculoskeletal: Negative for joint pain and myalgias.  Skin: Negative for itching and rash.  Neurological: Negative for dizziness, tingling, weakness and headaches.  Endo/Heme/Allergies: Negative for environmental allergies. Does not bruise/bleed easily.  Psychiatric/Behavioral: Negative for depression. The patient is not nervous/anxious and does not have insomnia.   All other systems reviewed and are negative.    Objective:  Physical Exam Vitals reviewed.  Constitutional:      General: He is not in acute distress.    Appearance: He is well-developed.  HENT:     Head: Normocephalic and atraumatic.  Eyes:     General: No scleral icterus.    Conjunctiva/sclera: Conjunctivae normal.     Pupils: Pupils are equal, round, and reactive to light.  Neck:     Vascular: No JVD.     Trachea: No tracheal deviation.  Cardiovascular:     Rate and Rhythm: Normal rate and regular rhythm.     Heart sounds: Normal heart sounds. No murmur heard.   Pulmonary:     Effort: Pulmonary effort is normal. No tachypnea, accessory muscle usage or respiratory distress.     Breath sounds: Normal breath sounds. No stridor. No wheezing, rhonchi or rales.  Abdominal:     General: Bowel sounds are normal. There is no distension.     Palpations: Abdomen is soft.     Tenderness: There is no abdominal tenderness.  Musculoskeletal:        General: No tenderness.     Cervical back: Neck supple.  Lymphadenopathy:     Cervical: No cervical adenopathy.  Skin:    General: Skin is warm and dry.     Capillary Refill: Capillary refill  takes less than 2 seconds.     Findings: No rash.  Neurological:     Mental Status: He is alert and oriented to person, place, and time.  Psychiatric:        Behavior: Behavior normal.        Vitals:   05/02/20 1352  BP: 138/88  Pulse: 86  Temp: (!) 97.1 F (36.2 C)  TempSrc: Temporal  SpO2: 99%  Weight: 191 lb 6.4 oz (86.8 kg)  Height: '5\' 7"'  (1.702 m)   99% on RA BMI Readings from Last 3 Encounters:  05/02/20 29.98 kg/m  04/07/20 30.13 kg/m  11/05/19 32.28 kg/m   Wt Readings from Last 3 Encounters:  05/02/20 191 lb 6.4 oz (86.8 kg)  04/07/20 189 lb 8 oz (86 kg)  11/05/19 200 lb (90.7 kg)     CBC    Component Value Date/Time   WBC 5.1 08/19/2019 0840   RBC 5.25 08/19/2019 0840   HGB 14.4 08/19/2019 0840   HCT 43.9 08/19/2019 0840   PLT 187.0 08/19/2019 0840   MCV 83.6 08/19/2019 0840   MCH 26.6 (L) 09/10/2016 1327   MCHC 32.8 08/19/2019 0840   RDW 14.1 08/19/2019 0840   LYMPHSABS 1.4 08/19/2019 0840   MONOABS 0.6 08/19/2019 0840   EOSABS 0.2 08/19/2019 0840   BASOSABS 0.1 08/19/2019 0840    Chest Imaging:  04/21/2020: CT chest bilateral scattered nodularity, more dominant nodule in the right, left upper lobe lesion with a cavitary nodule in the anterior aspect of more pleural-based lesion as described above.  Small mediastinal adenopathy. The patient's images have been independently reviewed by me.    04/26/2020 nuclear medicine pet imaging PET avid lesions within the left upper and lower lobes bilaterally.  Concerning for inflammatory disease versus metastatic malignancy. The patient's images have been independently reviewed by me.    Pulmonary Functions Testing Results: PFT Results Latest Ref Rng & Units 09/23/2017  FVC-Pre L 3.77  FVC-Predicted Pre % 90  FVC-Post L 3.74  FVC-Predicted Post % 89  Pre FEV1/FVC % % 49  Post FEV1/FCV % % 53  FEV1-Pre L 1.84  FEV1-Predicted Pre % 59  FEV1-Post L 1.97  DLCO uncorrected ml/min/mmHg 16.04  DLCO UNC% % 55  DLVA Predicted % 56  TLC L 7.08  TLC % Predicted % 108  RV % Predicted % 120    FeNO:   Pathology:   Echocardiogram:   Heart Catheterization:     Assessment & Plan:      ICD-10-CM   1. Abnormal CT scan of lung  R91.8   2. Abnormal PET scan of lung  R94.2   3. Multiple lung nodules  R91.8    Discussion:  This is a 69 year old gentleman history of COVID-19.  Ultimately had follow-up CT imaging of the chest due to persistent cough shortness of breath and hemoptysis.  Found to have multiple bilateral lung nodules and small areas with early cavitation concerning for malignancy versus diffuse pulmonary metastasis.  We reviewed these images today with the patient as well as the patient's wife in office.  We discussed the CAT scan results as well as the nuclear medicine pet imaging that was completed.  I am unsure if this represents a malignancy versus a inflammatory disorder.  Other diseases such as vasculitis should be considered.  However malignancy should be ruled out and this was discussed with the patient and his wife.  Plan: Patient scheduled for navigational bronchoscopy. We discussed the risk benefits and alternatives  of proceeding with navigational bronchoscopy and tissue sampling today in the office. Patient is agreeable to proceed. Recent CT scan of the chest will be converted for super D imaging. Pending pathology results will consider recommendations for follow-up imaging or additional lab testing.   Current Outpatient Medications:  .  aspirin 325 MG tablet, Take 325 mg by mouth daily., Disp: , Rfl:  .  atorvastatin (LIPITOR) 10 MG tablet, Take 1 tablet (10 mg total) by mouth daily., Disp: 30 tablet, Rfl: 11 .  ECHINACEA EXTRACT PO, Take 1,520 mg by mouth at bedtime. 760 mg each , Disp: , Rfl:  .  L-Arginine 500 MG CAPS, Take 1,000 mg by mouth at bedtime., Disp: , Rfl:  .  levOCARNitine L-Tartrate (L-CARNITINE) 500 MG CAPS, Take 500 mg by mouth at bedtime., Disp: , Rfl:  .  Multiple Minerals-Vitamins (CAL-MAG-ZINC-D) TABS, Take 1,000 mg by mouth at bedtime. , Disp: , Rfl:  .  Multiple Vitamins-Minerals (MULTIVITAMIN WITH MINERALS) tablet, Take  1 tablet by mouth daily. Centrum Silver men 50+, Disp: , Rfl:  .  nystatin cream (MYCOSTATIN), APPLY CREAM TOPICALLY TWICE DAILY (Patient taking differently: Apply 1 application topically 3 (three) times a week. ), Disp: 30 g, Rfl: 0 .  omeprazole (PRILOSEC) 20 MG capsule, TAKE 1 CAPSULE BY MOUTH TWICE DAILY BEFORE MEAL(S) (Patient taking differently: Take 40 mg by mouth daily. ), Disp: 180 capsule, Rfl: 0 .  PROVENTIL HFA 108 (90 Base) MCG/ACT inhaler, INHALE TWO PUFFS BY MOUTH EVERY 6 HOURS AS NEEDED FOR WHEEZING OR  SHORTNESS  OF  BREATH (Patient taking differently: Inhale 2 puffs into the lungs every 6 (six) hours as needed for wheezing or shortness of breath. ), Disp: 7 each, Rfl: 3 .  TRELEGY ELLIPTA 100-62.5-25 MCG/INH AEPB, INHALE 1 PUFF ONCE DAILY (Patient taking differently: Inhale 1 puff into the lungs daily. ), Disp: 180 each, Rfl: 3 .  triamcinolone cream (KENALOG) 0.1 %, Apply 1 application topically 2 (two) times daily as needed. (Patient taking differently: Apply 1 application topically 3 (three) times a week. ), Disp: 454 g, Rfl: 0 .  Turmeric Curcumin 500 MG CAPS, Take 500 mg by mouth at bedtime., Disp: , Rfl:   I spent 60 minutes dedicated to the care of this patient on the date of this encounter to include pre-visit review of records, face-to-face time with the patient discussing conditions above, post visit ordering of testing, clinical documentation with the electronic health record, making appropriate referrals as documented, and communicating necessary findings to members of the patients care team.   Garner Nash, DO [facility] 05/02/2020 1:58 PM

## 2020-05-03 ENCOUNTER — Ambulatory Visit (HOSPITAL_COMMUNITY): Payer: Medicare Other

## 2020-05-03 ENCOUNTER — Ambulatory Visit (HOSPITAL_COMMUNITY): Payer: Medicare Other | Admitting: Vascular Surgery

## 2020-05-03 ENCOUNTER — Encounter (HOSPITAL_COMMUNITY)
Admission: RE | Disposition: A | Discharge status: Home / Self Care | Payer: Self-pay | Source: Home / Self Care | Attending: Pulmonary Disease

## 2020-05-03 ENCOUNTER — Ambulatory Visit (HOSPITAL_COMMUNITY)
Admission: RE | Admit: 2020-05-03 | Discharge: 2020-05-03 | Disposition: A | Discharge status: Home / Self Care | Payer: Medicare Other | Attending: Pulmonary Disease | Admitting: Pulmonary Disease

## 2020-05-03 DIAGNOSIS — B49 Unspecified mycosis: Secondary | ICD-10-CM | POA: Diagnosis not present

## 2020-05-03 DIAGNOSIS — J939 Pneumothorax, unspecified: Secondary | ICD-10-CM | POA: Diagnosis not present

## 2020-05-03 DIAGNOSIS — Z79899 Other long term (current) drug therapy: Secondary | ICD-10-CM | POA: Diagnosis not present

## 2020-05-03 DIAGNOSIS — J449 Chronic obstructive pulmonary disease, unspecified: Secondary | ICD-10-CM | POA: Insufficient documentation

## 2020-05-03 DIAGNOSIS — Z880 Allergy status to penicillin: Secondary | ICD-10-CM | POA: Insufficient documentation

## 2020-05-03 DIAGNOSIS — Z9889 Other specified postprocedural states: Secondary | ICD-10-CM

## 2020-05-03 DIAGNOSIS — Z87891 Personal history of nicotine dependence: Secondary | ICD-10-CM | POA: Diagnosis not present

## 2020-05-03 DIAGNOSIS — E785 Hyperlipidemia, unspecified: Secondary | ICD-10-CM | POA: Diagnosis not present

## 2020-05-03 DIAGNOSIS — Z8616 Personal history of COVID-19: Secondary | ICD-10-CM | POA: Insufficient documentation

## 2020-05-03 DIAGNOSIS — Z836 Family history of other diseases of the respiratory system: Secondary | ICD-10-CM | POA: Diagnosis not present

## 2020-05-03 DIAGNOSIS — Z807 Family history of other malignant neoplasms of lymphoid, hematopoietic and related tissues: Secondary | ICD-10-CM | POA: Insufficient documentation

## 2020-05-03 DIAGNOSIS — Z8371 Family history of colonic polyps: Secondary | ICD-10-CM | POA: Diagnosis not present

## 2020-05-03 DIAGNOSIS — Z820 Family history of epilepsy and other diseases of the nervous system: Secondary | ICD-10-CM | POA: Diagnosis not present

## 2020-05-03 DIAGNOSIS — I2510 Atherosclerotic heart disease of native coronary artery without angina pectoris: Secondary | ICD-10-CM | POA: Diagnosis not present

## 2020-05-03 DIAGNOSIS — R911 Solitary pulmonary nodule: Secondary | ICD-10-CM

## 2020-05-03 DIAGNOSIS — R918 Other nonspecific abnormal finding of lung field: Secondary | ICD-10-CM | POA: Diagnosis not present

## 2020-05-03 DIAGNOSIS — Z7951 Long term (current) use of inhaled steroids: Secondary | ICD-10-CM | POA: Diagnosis not present

## 2020-05-03 DIAGNOSIS — Z809 Family history of malignant neoplasm, unspecified: Secondary | ICD-10-CM | POA: Insufficient documentation

## 2020-05-03 DIAGNOSIS — J439 Emphysema, unspecified: Secondary | ICD-10-CM | POA: Diagnosis not present

## 2020-05-03 DIAGNOSIS — Z8379 Family history of other diseases of the digestive system: Secondary | ICD-10-CM | POA: Insufficient documentation

## 2020-05-03 DIAGNOSIS — D509 Iron deficiency anemia, unspecified: Secondary | ICD-10-CM | POA: Diagnosis not present

## 2020-05-03 DIAGNOSIS — Z7982 Long term (current) use of aspirin: Secondary | ICD-10-CM | POA: Insufficient documentation

## 2020-05-03 DIAGNOSIS — Z801 Family history of malignant neoplasm of trachea, bronchus and lung: Secondary | ICD-10-CM | POA: Insufficient documentation

## 2020-05-03 HISTORY — PX: BRONCHIAL NEEDLE ASPIRATION BIOPSY: SHX5106

## 2020-05-03 HISTORY — DX: Pneumonia, unspecified organism: J18.9

## 2020-05-03 HISTORY — PX: VIDEO BRONCHOSCOPY WITH ENDOBRONCHIAL NAVIGATION: SHX6175

## 2020-05-03 HISTORY — DX: Bronchitis, not specified as acute or chronic: J40

## 2020-05-03 HISTORY — DX: Emphysema, unspecified: J43.9

## 2020-05-03 HISTORY — PX: BRONCHIAL BIOPSY: SHX5109

## 2020-05-03 HISTORY — PX: BRONCHIAL WASHINGS: SHX5105

## 2020-05-03 HISTORY — PX: BRONCHIAL BRUSHINGS: SHX5108

## 2020-05-03 LAB — POCT I-STAT, CHEM 8
BUN: 11 mg/dL (ref 8–23)
Calcium, Ion: 1.18 mmol/L (ref 1.15–1.40)
Chloride: 105 mmol/L (ref 98–111)
Creatinine, Ser: 0.9 mg/dL (ref 0.61–1.24)
Glucose, Bld: 93 mg/dL (ref 70–99)
HCT: 40 % (ref 39.0–52.0)
Hemoglobin: 13.6 g/dL (ref 13.0–17.0)
Potassium: 4.5 mmol/L (ref 3.5–5.1)
Sodium: 142 mmol/L (ref 135–145)
TCO2: 26 mmol/L (ref 22–32)

## 2020-05-03 LAB — CBC
HCT: 45.6 % (ref 39.0–52.0)
Hemoglobin: 14.3 g/dL (ref 13.0–17.0)
MCH: 26.8 pg (ref 26.0–34.0)
MCHC: 31.4 g/dL (ref 30.0–36.0)
MCV: 85.4 fL (ref 80.0–100.0)
Platelets: 344 10*3/uL (ref 150–400)
RBC: 5.34 MIL/uL (ref 4.22–5.81)
RDW: 15.2 % (ref 11.5–15.5)
WBC: 6.6 10*3/uL (ref 4.0–10.5)
nRBC: 0 % (ref 0.0–0.2)

## 2020-05-03 SURGERY — VIDEO BRONCHOSCOPY WITH ENDOBRONCHIAL NAVIGATION
Anesthesia: General | Laterality: Right

## 2020-05-03 MED ORDER — DEXAMETHASONE SODIUM PHOSPHATE 10 MG/ML IJ SOLN
INTRAMUSCULAR | Status: DC | PRN
  Administered 2020-05-03: 10 mg via INTRAVENOUS

## 2020-05-03 MED ORDER — MIDAZOLAM HCL 5 MG/5ML IJ SOLN
INTRAMUSCULAR | Status: DC | PRN
  Administered 2020-05-03: 2 mg via INTRAVENOUS

## 2020-05-03 MED ORDER — LIDOCAINE 2% (20 MG/ML) 5 ML SYRINGE
INTRAMUSCULAR | Status: DC | PRN
  Administered 2020-05-03: 60 mg via INTRAVENOUS

## 2020-05-03 MED ORDER — ROCURONIUM BROMIDE 10 MG/ML (PF) SYRINGE
PREFILLED_SYRINGE | INTRAVENOUS | Status: DC | PRN
  Administered 2020-05-03: 60 mg via INTRAVENOUS

## 2020-05-03 MED ORDER — FENTANYL CITRATE (PF) 100 MCG/2ML IJ SOLN
INTRAMUSCULAR | Status: DC | PRN
  Administered 2020-05-03 (×2): 50 ug via INTRAVENOUS

## 2020-05-03 MED ORDER — CHLORHEXIDINE GLUCONATE 0.12 % MT SOLN
OROMUCOSAL | Status: AC
  Filled 2020-05-03: qty 15

## 2020-05-03 MED ORDER — ONDANSETRON HCL 4 MG/2ML IJ SOLN
INTRAMUSCULAR | Status: DC | PRN
  Administered 2020-05-03: 4 mg via INTRAVENOUS

## 2020-05-03 MED ORDER — SUGAMMADEX SODIUM 200 MG/2ML IV SOLN
INTRAVENOUS | Status: DC | PRN
  Administered 2020-05-03: 180 mg via INTRAVENOUS

## 2020-05-03 MED ORDER — PROPOFOL 10 MG/ML IV BOLUS
INTRAVENOUS | Status: DC | PRN
  Administered 2020-05-03: 150 mg via INTRAVENOUS

## 2020-05-03 MED ORDER — PHENYLEPHRINE HCL-NACL 10-0.9 MG/250ML-% IV SOLN
INTRAVENOUS | Status: DC | PRN
  Administered 2020-05-03: 30 ug/min via INTRAVENOUS

## 2020-05-03 MED ORDER — LACTATED RINGERS IV SOLN: INTRAVENOUS | Status: DC

## 2020-05-03 SURGICAL SUPPLY — 46 items

## 2020-05-03 NOTE — Anesthesia Postprocedure Evaluation (Signed)
Anesthesia Post Note  Patient: Richard Grimes  Procedure(s) Performed: VIDEO BRONCHOSCOPY WITH ENDOBRONCHIAL NAVIGATION (Right ) BRONCHIAL BIOPSIES BRONCHIAL BRUSHINGS BRONCHIAL NEEDLE ASPIRATION BIOPSIES BRONCHIAL WASHINGS     Patient location during evaluation: PACU Anesthesia Type: General Level of consciousness: awake and alert Pain management: pain level controlled Vital Signs Assessment: post-procedure vital signs reviewed and stable Respiratory status: spontaneous breathing, nonlabored ventilation, respiratory function stable and patient connected to nasal cannula oxygen Cardiovascular status: blood pressure returned to baseline and stable Postop Assessment: no apparent nausea or vomiting Anesthetic complications: no   No complications documented.  Last Vitals:  Vitals:   05/03/20 1640 05/03/20 1650  BP: (!) 141/91 (!) 116/59  Pulse: 96 91  Resp: (!) 23 (!) 24  Temp:    SpO2: 96% 91%    Last Pain:  Vitals:   05/03/20 1650  TempSrc:   PainSc: 0-No pain                 Daysha Ashmore S

## 2020-05-03 NOTE — Op Note (Signed)
Video Bronchoscopy with Electromagnetic Navigation Procedure Note  Date of Operation: 05/03/2020  Pre-op Diagnosis: Multiple bilateral pulmonary nodules and infiltrates  Post-op Diagnosis: Multiple bilateral pulmonary nodules and infiltrates    Surgeon: Garner Nash, DO   Assistants: none   Anesthesia: General endotracheal anesthesia  Operation: Flexible video fiberoptic bronchoscopy with electromagnetic navigation and biopsies.  Estimated Blood Loss: Minimal, <9JT   Complications: None   Indications and History: Richard Grimes is a 69 y.o. male with multiple bilateral pulmonary nodules infiltrates.  The risks, benefits, complications, treatment options and expected outcomes were discussed with the patient.  The possibilities of pneumothorax, pneumonia, reaction to medication, pulmonary aspiration, perforation of a viscus, bleeding, failure to diagnose a condition and creating a complication requiring transfusion or operation were discussed with the patient who freely signed the consent.    Description of Procedure: The patient was seen in the Preoperative Area, was examined and was deemed appropriate to proceed.  The patient was taken to [facility] endoscopy room 2, identified as Richard Grimes and the procedure verified as Flexible Video Fiberoptic Bronchoscopy.  A Time Out was held and the above information confirmed.   Prior to the date of the procedure a high-resolution CT scan of the chest was performed. Utilizing [place] a virtual tracheobronchial tree was generated to allow the creation of distinct navigation pathways to the patient's parenchymal abnormalities. After being taken to the operating room general anesthesia was initiated and the patient  was orally intubated. The video fiberoptic bronchoscope was introduced via the endotracheal tube and a general inspection was performed which showed normal right and left lung anatomy no evidence of endobronchial lesion.    Target #1 left upper lobe: The extendable working channel and locator guide were introduced into the bronchoscope.  A full fluoroscopic sweep was completed for local registration with inspiratory breath-hold of 20 cm of water, from LAO 25 degrees to RAO 25 degrees. The distinct navigation pathways prepared prior to this procedure were then utilized to navigate to within 1.5 cm of patient's lesion(s) identified on CT scan. The extendable working channel was secured into place and the locator guide was withdrawn. Under fluoroscopic guidance transbronchial needle brushings, transbronchial Wang needle biopsies, and transbronchial forceps biopsies were performed to be sent for cytology and pathology. A bronchioalveolar lavage was performed in the left upper lobe and sent for cytology and microbiology (bacterial, fungal, AFB smears and cultures).   Target #2 lingula The extendable working channel and locator guide were introduced into the bronchoscope.  A full fluoroscopic sweep was completed for local registration with inspiratory breath-hold of 20 cm of water, from LAO 25 degrees to RAO 25 degrees. The distinct navigation pathways prepared prior to this procedure were then utilized to navigate to within 0.9 cm of patient's lesion(s) identified on CT scan. The extendable working channel was secured into place and the locator guide was withdrawn. Under fluoroscopic guidance transbronchial needle brushings, transbronchial Wang needle biopsies, and transbronchial forceps biopsies were performed to be sent for cytology and pathology. A bronchioalveolar lavage was performed in the lingula and sent for cytology and microbiology (bacterial, fungal, AFB smears and cultures).   Target #3 right upper lobe: The extendable working channel and locator guide were introduced into the bronchoscope.  A full fluoroscopic sweep was completed for local registration with inspiratory breath-hold of 20 cm of water, from LAO 25  degrees to RAO 25 degrees. The distinct navigation pathways prepared prior to this procedure were then utilized to  navigate to within 0.8 cm of patient's lesion(s) identified on CT scan. The extendable working channel was secured into place and the locator guide was withdrawn. Under fluoroscopic guidance transbronchial needle brushings, transbronchial Wang needle biopsies, and transbronchial forceps biopsies were performed to be sent for cytology and pathology. A bronchioalveolar lavage was performed in the right upper lobe and sent for cytology and microbiology (bacterial, fungal, AFB smears and cultures).   At the end of the procedure a general airway inspection was performed and there was no evidence of active bleeding.  The therapeutic bronchoscope was inserted into the airway and aspiration of the bilateral mainstem's to remove secretions blood clot and debris.  There is no evidence of active bleeding and both bilateral mainstem's were cleared.  Saline was used for irrigation.  Bronchoscope was brought to just above the main carina there was no evidence of active bleeding. The bronchoscope was removed.  The patient tolerated the procedure well. There was no significant blood loss and there were no obvious complications. A post-procedural chest x-ray is pending.  Samples: 1. Transbronchial needle brushings from left upper lobe 2. Transbronchial Wang needle biopsies from left upper lobe 3. Transbronchial forceps biopsies from left upper lobe 4. Bronchoalveolar lavage from left upper lobe  Samples: 1. Transbronchial needle brushings from lingula 2. Transbronchial Wang needle biopsies from lingula 3. Transbronchial forceps biopsies from lingula 4. Bronchoalveolar lavage from lingula  Samples: 1. Transbronchial needle brushings from right upper lobe 2. Transbronchial Wang needle biopsies from right upper lobe 3. Transbronchial forceps biopsies from right upper lobe 4. Bronchoalveolar lavage from  right upper lobe  Plans:  The patient will be discharged from the PACU to home when recovered from anesthesia and after chest x-ray is reviewed. We will review the cytology, pathology and microbiology results with the patient when they become available. Outpatient followup will be with Marshell Garfinkel, MD or Octavio Graves Eyal Greenhaw, DO.   Preliminary pathology: Cyto-tech identified fungal elements present   Garner Nash, DO [facility] 05/03/2020 4:46 PM

## 2020-05-03 NOTE — Interval H&P Note (Signed)
History and Physical Interval Note:  05/03/2020 2:29 PM  Richard Grimes  has presented today for surgery, with the diagnosis of LUNG NODULE RIGHT.  The various methods of treatment have been discussed with the patient and family. After consideration of risks, benefits and other options for treatment, the patient has consented to  Procedure(s): Eva (Right) as a surgical intervention.  The patient's history has been reviewed, patient examined, no change in status, stable for surgery.  I have reviewed the patient's chart and labs.  Questions were answered to the patient's satisfaction.    Patient seen in pre-op. No barriers to proceed at this time.   [place]

## 2020-05-03 NOTE — Discharge Instructions (Signed)
Flexible Bronchoscopy, Care After This sheet gives you information about how to care for yourself after your test. Your doctor may also give you more specific instructions. If you have problems or questions, contact your doctor. Follow these instructions at home: Eating and drinking  The day after the test, go back to your normal diet. Driving  Do not drive for 24 hours if you were given a medicine to help you relax (sedative).  Do not drive or use heavy machinery while taking prescription pain medicine. General instructions   Take over-the-counter and prescription medicines only as told by your doctor.  Return to your normal activities as told. Ask what activities are safe for you.  Do not use any products that have nicotine or tobacco in them. This includes cigarettes and e-cigarettes. If you need help quitting, ask your doctor.  Keep all follow-up visits as told by your doctor. This is important. It is very important if you had a tissue sample (biopsy) taken. Get help right away if:  You have shortness of breath that gets worse.  You get light-headed.  You feel like you are going to pass out (faint).  You have chest pain.  You cough up: ? More than a little blood. ? More blood than before. Summary  Do not eat or drink anything (not even water) for 2 hours after your test, or until your numbing medicine wears off.  Do not use cigarettes. Do not use e-cigarettes.  Get help right away if you have chest pain. This information is not intended to replace advice given to you by your health care provider. Make sure you discuss any questions you have with your health care provider. Document Revised: 05/31/2017 Document Reviewed: 07/06/2016 Elsevier Patient Education  2020 [employer].

## 2020-05-03 NOTE — Transfer of Care (Signed)
Immediate Anesthesia Transfer of Care Note  Patient: Richard Grimes  Procedure(s) Performed: VIDEO BRONCHOSCOPY WITH ENDOBRONCHIAL NAVIGATION (Right ) BRONCHIAL BIOPSIES BRONCHIAL BRUSHINGS BRONCHIAL NEEDLE ASPIRATION BIOPSIES BRONCHIAL WASHINGS  Patient Location: PACU  Anesthesia Type:General  Level of Consciousness: awake, alert , oriented and patient cooperative  Airway & Oxygen Therapy: Patient Spontanous Breathing and Patient connected to face mask oxygen  Post-op Assessment: Report given to RN and Post -op Vital signs reviewed and stable  Post vital signs: Reviewed and stable  Last Vitals:  Vitals Value Taken Time  BP    Temp    Pulse 90 05/03/20 1641  Resp 16 05/03/20 1641  SpO2 95 % 05/03/20 1641  Vitals shown include unvalidated device data.  Last Pain:  Vitals:   05/03/20 1349  TempSrc:   PainSc: 0-No pain         Complications: No complications documented.

## 2020-05-03 NOTE — Anesthesia Procedure Notes (Addendum)
Procedure Name: Intubation Date/Time: 05/03/2020 3:06 PM Performed by: Cleda Daub, CRNA Pre-anesthesia Checklist: Patient identified, Emergency Drugs available, Suction available and Patient being monitored Patient Re-evaluated:Patient Re-evaluated prior to induction Oxygen Delivery Method: Circle system utilized Preoxygenation: Pre-oxygenation with 100% oxygen Induction Type: IV induction Ventilation: Mask ventilation without difficulty Laryngoscope Size: Miller and 2 Grade View: Grade I Tube type: Oral Tube size: 8.5 mm Number of attempts: 1 Airway Equipment and Method: Stylet and Oral airway Placement Confirmation: ETT inserted through vocal cords under direct vision,  positive ETCO2 and breath sounds checked- equal and bilateral Secured at: 22 cm Tube secured with: Tape Dental Injury: Teeth and Oropharynx as per pre-operative assessment

## 2020-05-05 ENCOUNTER — Telehealth: Payer: Self-pay | Admitting: *Deleted

## 2020-05-05 LAB — ACID FAST SMEAR (AFB, MYCOBACTERIA)
Acid Fast Smear: NEGATIVE
Acid Fast Smear: NEGATIVE

## 2020-05-05 LAB — CYTOLOGY - NON PAP

## 2020-05-05 NOTE — Telephone Encounter (Signed)
-----   Message from Joellen Jersey sent at 04/27/2020 12:09 PM EDT ----- Regarding: enb Enb[facility] 05/03/20@3 :15pm covid test 04/30/20@11 :15am I will call pt Joellen Jersey

## 2020-05-06 ENCOUNTER — Encounter (HOSPITAL_COMMUNITY): Payer: Self-pay | Admitting: Pulmonary Disease

## 2020-05-06 DIAGNOSIS — B449 Aspergillosis, unspecified: Secondary | ICD-10-CM

## 2020-05-06 LAB — CULTURE, RESPIRATORY W GRAM STAIN
Culture: NO GROWTH
Culture: NO GROWTH
Gram Stain: NONE SEEN

## 2020-05-06 LAB — ASPERGILLUS ANTIGEN, BAL/SERUM
Aspergillus Ag, BAL/Serum: 0.07 Index (ref 0.00–0.49)
Aspergillus Ag, BAL/Serum: 0.17 Index (ref 0.00–0.49)
Aspergillus Ag, BAL/Serum: 0.57 Index — ABNORMAL HIGH (ref 0.00–0.49)
Aspergillus Ag, BAL/Serum: 0.76 Index — ABNORMAL HIGH (ref 0.00–0.49)

## 2020-05-06 NOTE — Telephone Encounter (Signed)
BI please advise. Thanks  

## 2020-05-06 NOTE — Telephone Encounter (Signed)
Prescribe voriconazole 300mg  twice daily. It may need prior auth by insurance Also place a referral to ID clinic   Make follow up in clinic with me at next available. Thanks

## 2020-05-07 LAB — ACID FAST SMEAR (AFB, MYCOBACTERIA)
Acid Fast Smear: NEGATIVE
Acid Fast Smear: NEGATIVE
Acid Fast Smear: NEGATIVE

## 2020-05-08 LAB — AEROBIC/ANAEROBIC CULTURE W GRAM STAIN (SURGICAL/DEEP WOUND)
Culture: NO GROWTH
Culture: NO GROWTH
Gram Stain: NONE SEEN

## 2020-05-09 ENCOUNTER — Encounter: Payer: Self-pay | Admitting: Family Medicine

## 2020-05-09 MED ORDER — VORICONAZOLE 200 MG PO TABS: 300.0000 mg | ORAL_TABLET | Freq: Two times a day (BID) | ORAL | 5 refills | Status: DC

## 2020-05-09 NOTE — Telephone Encounter (Signed)
Dr. Vaughan Browner,  Please see patient's comment regarding medication cost.  Pharmacy team: Please advise if there are any assistance programs for this medications.  Thank you.

## 2020-05-09 NOTE — Telephone Encounter (Signed)
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 So if you want to start I agree so treatment is not delayed. We can also work on financial assistance in parallel.  Send a mychart message on the day you start so we can get blood tests to check levels about a week into therapy.   Marshell Garfinkel MD [facility] 05/09/2020, 5:15 PM

## 2020-05-10 MED ORDER — VORICONAZOLE 200 MG PO TABS
300.0000 mg | ORAL_TABLET | Freq: Two times a day (BID) | ORAL | 5 refills | Status: DC
Start: 2020-05-10 — End: 2020-06-07

## 2020-05-11 LAB — FUNGAL ORGANISM REFLEX

## 2020-05-11 LAB — FUNGUS CULTURE RESULT

## 2020-05-11 LAB — FUNGUS CULTURE WITH STAIN

## 2020-05-12 ENCOUNTER — Telehealth: Payer: Self-pay | Admitting: Pulmonary Disease

## 2020-05-12 NOTE — Telephone Encounter (Signed)
Copying this info to PA encounter.

## 2020-05-12 NOTE — Telephone Encounter (Signed)
Dr. Vaughan Browner, please see pt's mychart message as an FYI.

## 2020-05-12 NOTE — Telephone Encounter (Signed)
05/12/2020 04:50 PM Phone (Incoming) Richard Grimes, Richard Grimes (Self) [phone] Lemmie Evens)   Tamika from [facility] called for approval on medication voriconazole (VFEND) 200 MG tablet [[id number]]7 months approval [id number] to [id number] . please advise

## 2020-05-12 NOTE — Telephone Encounter (Signed)
PA request received from [place] requested: voriconazole 200mg   CMM Key: [id number] Tried/failed: n/a Covered alternatives: n/a PA request has been sent to plan, and a determination is expected within 3 days.   Routing to [place] for follow-up.

## 2020-05-13 ENCOUNTER — Other Ambulatory Visit: Payer: Self-pay

## 2020-05-13 ENCOUNTER — Ambulatory Visit: Payer: Medicare Other | Admitting: Internal Medicine

## 2020-05-13 ENCOUNTER — Encounter: Payer: Self-pay | Admitting: Internal Medicine

## 2020-05-13 VITALS — BP 129/88 | HR 88 | Temp 98.1°F | Resp 16 | Ht 67.0 in | Wt 192.0 lb

## 2020-05-13 DIAGNOSIS — R918 Other nonspecific abnormal finding of lung field: Secondary | ICD-10-CM

## 2020-05-13 DIAGNOSIS — B441 Other pulmonary aspergillosis: Secondary | ICD-10-CM | POA: Insufficient documentation

## 2020-05-13 DIAGNOSIS — Z8616 Personal history of COVID-19: Secondary | ICD-10-CM | POA: Insufficient documentation

## 2020-05-13 DIAGNOSIS — J431 Panlobular emphysema: Secondary | ICD-10-CM

## 2020-05-13 NOTE — Patient Instructions (Addendum)
Thank you for coming to see me today. It was a pleasure seeing you.  Continue taking the voriconazole as prescribed.  Please follow-up with lab work on 05/18/20 in the morning.  Make sure that it is about 12 hours after your last dose of voriconazole.  Follow up with me in 4-6 weeks for an office visit.  If you have any questions or concerns, please do not hesitate to call the office at [phone].  Take Care,   Jule Ser, DO

## 2020-05-13 NOTE — Assessment & Plan Note (Signed)
History of moderate COPD followed by Dr Vaughan Browner.  Plan: -- continued pulmonary follow up.

## 2020-05-13 NOTE — Assessment & Plan Note (Addendum)
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  Started on voriconazole 351m BID which he began on 05/11/20.   Tolerating well thus far without issues other than noting colors seem more bright for a couple hrs then dissipates.  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  Plan: -- continue voriconazole 3033mBID -- check trough 5-7 days after starting therapy.  Advised to come to clinic for lab draw approximately 12 hrs after last dose when coming in for lab -- baseline CBC and CMP at that time as well -- check serum Aspergillus Ag for monitoring purposes -- will require follow up CT chest to reassess response in several weeks -- QTc today is stable from prior at 479 -- discussed side effects related to voriconazole today -- RTC 4-6 weeks for office visit -- RTC next week for lab appointment

## 2020-05-13 NOTE — Progress Notes (Signed)
[facility] for Infectious Disease  Reason for Consult: pulmonary aspergillus Referring Provider: Dr Lorelei Pont (PCP), Dr Vaughan Browner (Pulmonary)  HPI:  Richard Grimes is a 69 y.o. 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     He is a 69 yo man and has a PMHx as noted below significant for prior heavy tobacco use, COPD, known pulmonary nodules, and recent COVID 19 infection (unvaccinated) in September 2021 treated with MAB infusion as well as prednisone x 20 days, ivermectin x 10 days, and doxycycline obtained from personal stockpile and from a feeding store for Ivermectin.  He then started developing symptoms of hemoptysis.  Seen by PCP on 04/07/20 who obtained a CT scan of his chest.  This was concerning for malignancy and he underwent bronchoscopy with BAL on 05/03/20.  Fungal cultures positive for Aspergillus terreus, BAL galactomannan positive, and cytology was positive with fungal organisms c/w Aspergillus.  One biopsy culture did grow Micrococcus of unclear significance. He started oral Voriconazole 372m BID as prescribed by his pulmonologist on 05/11/20.  He self discontinued his Lipitor since starting.  He presents today for initial ID evaluation.  He has a history of heavy tobacco use and COPD with occasional steroid use.  Also, recent COVID infection and Aspergillus has been well described.  He has no history of organ transplantation, BMT, or other known immunosuppression.    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  [place]  Patient's Medications  New Prescriptions   No medications on file  Previous Medications   ASPIRIN EC 81 MG TABLET    Take 81 mg by mouth daily. Swallow whole.   ECHINACEA EXTRACT PO    Take 1,520 mg by mouth at bedtime. 760 mg each    L-ARGININE 500 MG CAPS    Take 1,000 mg by mouth at bedtime.   LEVOCARNITINE L-TARTRATE (L-CARNITINE) 500 MG CAPS    Take 500 mg by mouth at bedtime.    MULTIPLE MINERALS-VITAMINS (CAL-MAG-ZINC-D) TABS    Take 1,000 mg by mouth at bedtime.    MULTIPLE VITAMINS-MINERALS (MULTIVITAMIN WITH MINERALS) TABLET    Take 1 tablet by mouth daily. Centrum Silver men 50+   NYSTATIN CREAM (MYCOSTATIN)    APPLY CREAM TOPICALLY TWICE DAILY   OMEPRAZOLE (PRILOSEC) 20 MG CAPSULE    TAKE 1 CAPSULE BY MOUTH TWICE DAILY BEFORE MEAL(S)   PROVENTIL HFA 108 (90 BASE) MCG/ACT INHALER    INHALE TWO PUFFS BY MOUTH EVERY 6 HOURS AS NEEDED FOR WHEEZING OR  SHORTNESS  OF  BREATH   TRELEGY ELLIPTA 100-62.5-25 MCG/INH AEPB    INHALE 1 PUFF ONCE DAILY   TRIAMCINOLONE CREAM (KENALOG) 0.1 %    Apply 1 application topically 2 (two) times daily as needed.   TURMERIC CURCUMIN 500 MG CAPS    Take 500 mg by mouth at bedtime.   VORICONAZOLE (VFEND) 200 MG TABLET    Take 1.5 tablets (300 mg total) by mouth 2 (two) times daily.  Modified Medications   No medications on file  Discontinued Medications   ASPIRIN 325 MG TABLET    Take 325 mg by mouth daily.   ATORVASTATIN (LIPITOR) 10 MG TABLET    Take 1 tablet (10 mg total) by mouth daily.      Past Medical History:  Diagnosis Date  . Allergic rhinitis due to pollen 07/18/2011  . Allergy   . Asthma  as a child  . Bronchitis    history  . COPD (chronic obstructive pulmonary disease) (HCC)    Severe, based on Spirometry.   . Depression   . Emphysema/COPD ([place])   . Family history of colonic polyps   . Family history of lung cancer   . Family history of lymphoma   . Family history of melanoma   . Family history of skin cancer   . GERD (gastroesophageal reflux disease)   . History of colonic polyps 11/17/2019  . Hx of adenomatous colonic polyps   . Hyperlipidemia   . Iron deficiency anemia   . Pneumonia    x 1    Social History   Tobacco Use  . Smoking status: Former Smoker    Packs/day: 2.00    Years: 35.00    Pack years: 70.00    Types: Cigarettes    Quit date: 07/02/2004    Years since quitting: 15.8  . Smokeless  tobacco: Never Used  Vaping Use  . Vaping Use: Never used  Substance Use Topics  . Alcohol use: Yes    Alcohol/week: 0.0 standard drinks    Comment: occ glass on wine, or beer  . Drug use: No    Family History  Problem Relation Age of Onset  . Pulmonary fibrosis Mother   . GER disease Mother   . Hypertension Mother   . Colon polyps Mother   . Hypertension Father   . Melanoma Father 64       lower back  . Skin cancer Father 71       lip  . Alzheimer's disease Maternal Grandmother   . Deep vein thrombosis Maternal Grandfather   . Lymphoma Maternal Grandfather 25  . Deep vein thrombosis Paternal Grandmother   . Deep vein thrombosis Paternal Grandfather   . Skin cancer Paternal Grandfather        lip; dx. in his 46s  . Colon polyps Maternal Uncle   . Lung cancer Cousin 16       smoker (maternal cousin)  . Colon cancer Neg Hx   . Esophageal cancer Neg Hx   . Rectal cancer Neg Hx   . Stomach cancer Neg Hx     Allergies  Allergen Reactions  . Penicillins Rash    Reaction: 50 Years ago    Review of Systems: Review of Systems  Constitutional: Negative for chills and fever.  Eyes: Negative for blurred vision and double vision.  Respiratory: Positive for cough, hemoptysis and shortness of breath.   Cardiovascular: Negative for chest pain and leg swelling.  Gastrointestinal: Negative for abdominal pain, nausea and vomiting.  Skin: Negative for itching and rash.  Neurological: Negative.   Psychiatric/Behavioral: Negative.   All other systems reviewed and are negative.    Objective: Vitals:   05/13/20 0924  BP: 129/88  Pulse: 88  Resp: 16  Temp: 98.1 F (36.7 C)  SpO2: 98%  Weight: 192 lb (87.1 kg)  Height: _0  (1.702 m)     Body mass index is 30.07 kg/m.  Physical Exam Vitals reviewed.  Constitutional:      Appearance: Normal appearance.  HENT:     Head: Normocephalic and atraumatic.     Nose: Nose normal.  Eyes:     Extraocular Movements:  Extraocular movements intact.     Conjunctiva/sclera: Conjunctivae normal.  Cardiovascular:     Rate and Rhythm: Normal rate and regular rhythm.  Pulmonary:     Effort: Pulmonary effort is normal. No  respiratory distress.     Breath sounds: Wheezing present. No rales.  Abdominal:     General: Abdomen is flat.     Palpations: Abdomen is soft.     Tenderness: There is no abdominal tenderness.  Skin:    General: Skin is warm and dry.     Findings: No rash.  Neurological:     General: No focal deficit present.     Mental Status: He is alert and oriented to person, place, and time.  Psychiatric:        Mood and Affect: Mood normal.        Behavior: Behavior normal.      Pertinent Labs and Microbiology  CBC Latest Ref Rng & Units 05/03/2020 05/03/2020 08/19/2019  WBC 4.0 - 10.5 K/uL - 6.6 5.1  Hemoglobin 13.0 - 17.0 g/dL 13.6 14.3 14.4  Hematocrit 39 - 52 % 40.0 45.6 43.9  Platelets 150 - 400 K/uL - 344 187.0   CMP Latest Ref Rng & Units 05/03/2020 04/21/2020 08/19/2019  Glucose 70 - 99 mg/dL 93 - 111(H)  BUN 8 - 23 mg/dL 11 - 12  Creatinine 0.61 - 1.24 mg/dL 0.90 0.80 0.90  Sodium 135 - 145 mmol/L 142 - 140  Potassium 3.5 - 5.1 mmol/L 4.5 - 4.4  Chloride 98 - 111 mmol/L 105 - 105  CO2 19 - 32 mEq/L - - 31  Calcium 8.4 - 10.5 mg/dL - - 8.8  Total Protein 6.0 - 8.3 g/dL - - 5.7(L)  Total Bilirubin 0.2 - 1.2 mg/dL - - 0.5  Alkaline Phos 39 - 117 U/L - - 93  AST 0 - 37 U/L - - 19  ALT 0 - 53 U/L - - 25     Recent Results (from the past 240 hour(s))  Fungus Culture With Stain     Status: None (Preliminary result)   Collection Time: 05/03/20  3:35 PM   Specimen: PATH Cytology Navigation; Lung  Result Value Ref Range Status   Fungus Stain Final report  Final    Comment: (NOTE) Performed At: [facility] [place], [place] [id number] Rush Farmer MD LY:[id number]    Fungus (Mycology) Culture PENDING  Incomplete   Fungal Source LUNG  Final    Comment:  LEFT UPPER LUNG BIOPSIES Performed at [facility], [place] [address] [place], [place] [address]   Acid Fast Smear (AFB)     Status: None   Collection Time: 05/03/20  3:35 PM   Specimen: PATH Cytology Navigation; Lung  Result Value Ref Range Status   AFB Specimen Processing Comment  Final    Comment: Tissue Grinding and Digestion/Decontamination   Acid Fast Smear Negative  Final    Comment: (NOTE) Performed At: [facility] [place], [place] [id number] Rush Farmer MD HQ:[id number]    Source (AFB) LUNG  Final    Comment: LEFT UPPER LUNG BIOPSIES Performed at [facility], [place] [address], [place], [place] [address]   Aspergillus Ag, BAL/Serum     Status: None   Collection Time: 05/03/20  3:35 PM  Result Value Ref Range Status   Aspergillus Ag, BAL/Serum 0.07 0.00 - 0.49 Index Final    Comment: (NOTE) Performed At: [facility] [place], [place] [id number] Rush Farmer MD ZE:[id number]   Aerobic/Anaerobic Culture (surgical/deep wound)     Status: None   Collection Time: 05/03/20  3:35 PM   Specimen: Lung  Result Value Ref Range Status  Specimen Description LUNG  Final   Special Requests LEFT UPPER LUNG BIOPSIES  Final   Gram Stain   Final    RARE WBC PRESENT, PREDOMINANTLY MONONUCLEAR NO ORGANISMS SEEN    Culture   Final    No growth aerobically or anaerobically. Performed at [facility], [place] [address], [place], [place] [address]    Report Status 05/08/2020 FINAL  Final  Fungus Culture Result     Status: None   Collection Time: 05/03/20  3:35 PM  Result Value Ref Range Status   Result 1 Comment  Final    Comment: (NOTE) KOH/Calcofluor preparation:  no fungus observed. Performed At: [facility] [place], [place] [id number] Rush Farmer MD [id number]   Fungus Culture With Stain     Status: None (Preliminary result)   Collection Time: 05/03/20  3:37 PM   Specimen: Bronchial  Alveolar Lavage; Respiratory  Result Value Ref Range Status   Fungus Stain Final report  Final    Comment: (NOTE) Performed At: [facility] [place], [place] [id number] Rush Farmer MD [id number]    Fungus (Mycology) Culture PENDING  Incomplete   Fungal Source BRONCHIAL ALVEOLAR LAVAGE  Final    Comment: LUL Performed at [facility], [place] [address] [place], [place] [address]   Acid Fast Smear (AFB)     Status: None   Collection Time: 05/03/20  3:37 PM   Specimen: Bronchial Alveolar Lavage; Respiratory  Result Value Ref Range Status   AFB Specimen Processing Concentration  Final   Acid Fast Smear Negative  Final    Comment: (NOTE) Performed At: [facility] [place], [place] [id number] Rush Farmer MD RZ:[id number]    Source (AFB) BRONCHIAL ALVEOLAR LAVAGE  Final    Comment: LUL Performed at [facility], [place] [address], [place], [place] [address]   Aspergillus Ag, BAL/Serum     Status: None   Collection Time: 05/03/20  3:37 PM  Result Value Ref Range Status   Aspergillus Ag, BAL/Serum 0.17 0.00 - 0.49 Index Final    Comment: (NOTE) Performed At: [facility] [place], [place] [id number] Rush Farmer MD JK:[id number]   Aerobic/Anaerobic Culture (surgical/deep wound)     Status: None   Collection Time: 05/03/20  3:37 PM   Specimen: Bronchoalveolar Lavage  Result Value Ref Range Status   Specimen Description BRONCHIAL ALVEOLAR LAVAGE  Final   Special Requests LEFT UPPER LOBE  Final   Gram Stain   Final    RARE WBC PRESENT,BOTH PMN AND MONONUCLEAR NO ORGANISMS SEEN    Culture   Final    No growth aerobically or anaerobically. Performed at [facility], [place] [address], [place], [place] [address]    Report Status 05/08/2020 FINAL  Final  Fungus Culture Result     Status: None   Collection Time: 05/03/20  3:37 PM  Result Value Ref Range Status   Result 1 Comment  Final     Comment: (NOTE) KOH/Calcofluor preparation:  no fungus observed. Performed At: [facility] [place], [place] [id number] Rush Farmer MD HV:[id number]   Fungus Culture With Stain     Status: None (Preliminary result)   Collection Time: 05/03/20  3:50 PM   Specimen: PATH Cytology Navigation; Lung  Result Value Ref Range Status   Fungus Stain Final report  Final    Comment: (NOTE) Performed At: [facility] [place],  [place] [id number] Rush Farmer MD OJ:[id number]    Fungus (Mycology) Culture PENDING  Incomplete   Fungal Source BIOPSY  Final    Comment: LEFT UPPER LUNG TARGET 2 RULE OUT ASPERGILLUS Performed at [facility], [place] [address], [place], [place] [address]   Acid Fast Smear (AFB)     Status: None   Collection Time: 05/03/20  3:50 PM   Specimen: PATH Cytology Navigation; Lung  Result Value Ref Range Status   AFB Specimen Processing Comment  Final    Comment: Tissue Grinding and Digestion/Decontamination   Acid Fast Smear Negative  Final    Comment: (NOTE) Performed At: [facility] [place], [place] [id number] Rush Farmer MD FB:[id number]    Source (AFB) BIOPSY  Final    Comment: LEFT UPPER LUNG TARGET 2 Performed at [facility], [place] [address], [place], [place] [address]   Aerobic/Anaerobic Culture (surgical/deep wound)     Status: None   Collection Time: 05/03/20  3:50 PM   Specimen: Biopsy  Result Value Ref Range Status   Specimen Description BIOPSY  Final   Special Requests LUL TARGET 2  Final   Gram Stain NO WBC SEEN NO ORGANISMS SEEN   Final   Culture   Final    RARE MICROCOCCUS SPECIES Standardized susceptibility testing for this organism is not available. NO ANAEROBES ISOLATED Performed at [facility], [place] [address], [place], [place] [address]    Report Status 05/08/2020 FINAL  Final  Fungus Culture Result     Status: None   Collection Time: 05/03/20   3:50 PM  Result Value Ref Range Status   Result 1 Comment  Final    Comment: (NOTE) KOH/Calcofluor preparation:  no fungus observed. Performed At: [facility] [place], [place] [id number] Rush Farmer MD PY:[id number]   Fungus Culture With Stain     Status: Abnormal   Collection Time: 05/03/20  3:53 PM   Specimen: Bronchial Alveolar Lavage; Respiratory  Result Value Ref Range Status   Fungus Stain Final report  Final   Fungus (Mycology) Culture Final report (A)  Corrected    Comment: (NOTE) Performed At: [facility] [place], [place] [id number] Rush Farmer MD JA:[id number] CORRECTED ON 11/10 AT 2035: PREVIOUSLY REPORTED AS Preliminary report    Fungal Source BRONCHIAL ALVEOLAR LAVAGE  Final    Comment: TARGET 2 LINGULA LUL Performed at [facility], [place] [address], [place], [place] [address]   Culture, respiratory     Status: None   Collection Time: 05/03/20  3:53 PM   Specimen: Bronchial Alveolar Lavage; Respiratory  Result Value Ref Range Status   Specimen Description BRONCHIAL ALVEOLAR LAVAGE  Final   Special Requests TARGET 2 LINGULA LUL  Final   Gram Stain NO WBC SEEN NO ORGANISMS SEEN   Final   Culture   Final    NO GROWTH 2 DAYS Performed at [facility], [address] [place], [place] [address]    Report Status 05/06/2020 FINAL  Final  Acid Fast Smear (AFB)     Status: None   Collection Time: 05/03/20  3:53 PM   Specimen: Bronchial Alveolar Lavage; Respiratory  Result Value Ref Range Status   AFB Specimen Processing Concentration  Final   Acid Fast Smear Negative  Final    Comment: (NOTE) Performed At: [facility] [place], [place] [id number] Rush Farmer MD [id number]    Source (AFB)  BRONCHIAL ALVEOLAR LAVAGE  Final    Comment: TARGET 2 LINGULA LUL Performed at [facility], [place] [address], [place], [place] [address]   Fungus Culture Result     Status: None    Collection Time: 05/03/20  3:53 PM  Result Value Ref Range Status   Result 1 Comment  Final    Comment: (NOTE) KOH/Calcofluor preparation:  no fungus observed. Performed At: [facility] [place], [place] [id number] Rush Farmer MD FG:[id number]   Fungal organism reflex     Status: Abnormal   Collection Time: 05/03/20  3:53 PM  Result Value Ref Range Status   Fungal result 1 Aspergillus terreus (A)  Corrected    Comment: (NOTE) Performed At: [facility] [place], [place] [id number] Rush Farmer MD BP:[id number] CORRECTED ON 11/10 AT 2035: PREVIOUSLY REPORTED AS Comment   Fungus Culture With Stain     Status: None (Preliminary result)   Collection Time: 05/03/20  4:15 PM   Specimen: Bronchial Alveolar Lavage; Respiratory  Result Value Ref Range Status   Fungus Stain Final report  Final    Comment: (NOTE) Performed At: [facility] [place], [place] [id number] Rush Farmer MD ER:[id number]    Fungus (Mycology) Culture PENDING  Incomplete   Fungal Source BRONCHIAL ALVEOLAR LAVAGE  Final    Comment: RUL Performed at [facility], [place] [address] [place], [place] [address]   Culture, respiratory     Status: None   Collection Time: 05/03/20  4:15 PM   Specimen: Bronchial Alveolar Lavage; Respiratory  Result Value Ref Range Status   Specimen Description BRONCHIAL ALVEOLAR LAVAGE  Final   Special Requests RIGHT UPPER LOBE  Final   Gram Stain   Final    RARE WBC PRESENT,BOTH PMN AND MONONUCLEAR NO ORGANISMS SEEN    Culture   Final    NO GROWTH 2 DAYS Performed at [facility], [address], [place], [place] [address]    Report Status 05/06/2020 FINAL  Final  Acid Fast Smear (AFB)     Status: None   Collection Time: 05/03/20  4:15 PM   Specimen: Bronchial Alveolar Lavage; Respiratory  Result Value Ref Range Status   AFB Specimen Processing Concentration  Final   Acid Fast Smear  Negative  Final    Comment: (NOTE) Performed At: [facility] [place], [place] [id number] Rush Farmer MD AS:[id number]    Source (AFB) BRONCHIAL ALVEOLAR LAVAGE  Final    Comment: RUL Performed at [facility], [place] [address], [place], [place] [address]   Aspergillus Ag, BAL/Serum     Status: Abnormal   Collection Time: 05/03/20  4:15 PM  Result Value Ref Range Status   Aspergillus Ag, BAL/Serum 0.76 (H) 0.00 - 0.49 Index Final    Comment: (NOTE) Performed At: [facility] [place], [place] [id number] Rush Farmer MD GD:[id number]   Fungus Culture Result     Status: None   Collection Time: 05/03/20  4:15 PM  Result Value Ref Range Status   Result 1 Comment  Final    Comment: (NOTE) KOH/Calcofluor preparation:  no fungus observed. Performed At: [facility] [place], [place] [id number] Rush Farmer MD QJ:[id number]   Aspergillus Ag, BAL/Serum     Status: Abnormal   Collection Time: 05/03/20  4:24 PM   Specimen: Bronchial Alveolar Lavage  Result Value Ref Range Status   Aspergillus Ag, BAL/Serum  0.57 (H) 0.00 - 0.49 Index Final    Comment: (NOTE) Performed At: [facility] [address] [place], [place] [id number] Rush Farmer MD WU:[id number]     Imaging PET SCAN 04/26/20  IMPRESSION: 1. Multifocal FDG avid pulmonary nodules are again seen scattered throughout both lungs. The largest and most FDG avid are in the left upper lobe. The diagnosis of exclusion is primary bronchogenic carcinoma with diffuse pulmonary metastasis. However, given the rapid interval development of these nodules (from 08/21/2019) infectious etiologies are also a differential consideration. Furthermore, the subpleural lesion within the posterior left upper lobe appears decreased in size from 04/21/2020 which would be unusual for malignancy. Correlation with tissue sampling is recommended. 2. Small  mediastinal lymph nodes are identified which exhibit mild low level FDG uptake. 3. No signs of metastatic disease to the neck, abdomen or pelvis. No osseous metastasis identified  CT CHEST 04/21/20 IMPRESSION: 1. Underlying emphysematous change. Nodular opacities noted throughout the lungs, likely of neoplastic etiology. A nodular lesion in the left upper lobe anteriorly shows cavitation. No other cavitated lesions evident. This finding may well warrant bronchoscopic evaluation with cytologic assessment.  2. Multiple subcentimeter mediastinal lymph nodes. Single borderline prominent right hilar lymph node which may well have neoplastic etiology given the findings within the lungs.  3.  There is a degree of lower lobe bronchiectatic change.  4. Foci of aortic atherosclerosis and coronary artery calcification.  5.  Suspect adherent gallstone along the fundus of the gallbladder.  Assessment and Plan: Pulmonary aspergillosis ([place]) Diagnosed via BAL on 05/03/20 with positive cultures, cytology and Galactomannan.  Started on voriconazole 364m BID which he began on 05/11/20.   Tolerating well thus far without issues other than noting colors seem more bright for a couple hrs then dissipates.  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  Plan: -- continue voriconazole 3034mBID -- check trough 5-7 days after starting therapy.  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  Plan: -- treatment of aspergillus pneumonia  COPD (chronic obstructive pulmonary disease) with emphysema [place] followed by Dr MaVaughan Browner Plan: -- continued pulmonary follow up.     Orders Placed This Encounter  Procedures  . CBC w/Diff    Standing Status:   Future    Standing Expiration Date:   05/13/2021  . COMPLETE METABOLIC PANEL WITH GFR    Standing Status:   Future    Standing Expiration Date:   05/13/2021  . Voriconazole, Quant by LC/MS    Standing Status:   Future    Standing Expiration Date:   05/13/2021  . Aspergillus Antigen, Serum    Standing Status:   Future    Standing Expiration Date:   05/13/2021  . EKG 12-Lead      I spent greater than 60 minutes with the patient including greater than 50% of time in  face to face counsel of the patient and in coordination of their care.    Raynelle Highland for Infectious Disease [facility] 05/13/2020, 10:11 AM

## 2020-05-13 NOTE — Assessment & Plan Note (Signed)
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  Plan: -- treatment of aspergillus pneumonia

## 2020-05-16 MED ORDER — TRELEGY ELLIPTA 100-62.5-25 MCG/INH IN AEPB
INHALATION_SPRAY | RESPIRATORY_TRACT | 3 refills | Status: DC
Start: 2020-05-16 — End: 2021-06-01

## 2020-05-18 ENCOUNTER — Other Ambulatory Visit: Payer: Self-pay

## 2020-05-18 ENCOUNTER — Other Ambulatory Visit: Payer: Medicare Other

## 2020-05-18 DIAGNOSIS — B441 Other pulmonary aspergillosis: Secondary | ICD-10-CM

## 2020-05-23 LAB — CBC WITH DIFFERENTIAL/PLATELET
Absolute Monocytes: 855 cells/uL (ref 200–950)
Basophils Absolute: 117 cells/uL (ref 0–200)
Basophils Relative: 1.3 %
Eosinophils Absolute: 567 cells/uL — ABNORMAL HIGH (ref 15–500)
Eosinophils Relative: 6.3 %
HCT: 42 % (ref 38.5–50.0)
Hemoglobin: 13.6 g/dL (ref 13.2–17.1)
Lymphs Abs: 1917 cells/uL (ref 850–3900)
MCH: 26.6 pg — ABNORMAL LOW (ref 27.0–33.0)
MCHC: 32.4 g/dL (ref 32.0–36.0)
MCV: 82 fL (ref 80.0–100.0)
MPV: 9.8 fL (ref 7.5–12.5)
Monocytes Relative: 9.5 %
Neutro Abs: 5544 cells/uL (ref 1500–7800)
Neutrophils Relative %: 61.6 %
Platelets: 306 10*3/uL (ref 140–400)
RBC: 5.12 10*6/uL (ref 4.20–5.80)
RDW: 14.2 % (ref 11.0–15.0)
Total Lymphocyte: 21.3 %
WBC: 9 10*3/uL (ref 3.8–10.8)

## 2020-05-23 LAB — COMPLETE METABOLIC PANEL WITH GFR
AG Ratio: 1.6 (calc) (ref 1.0–2.5)
ALT: 16 U/L (ref 9–46)
AST: 19 U/L (ref 10–35)
Albumin: 3.9 g/dL (ref 3.6–5.1)
Alkaline phosphatase (APISO): 112 U/L (ref 35–144)
BUN: 14 mg/dL (ref 7–25)
CO2: 29 mmol/L (ref 20–32)
Calcium: 9.2 mg/dL (ref 8.6–10.3)
Chloride: 105 mmol/L (ref 98–110)
Creat: 1 mg/dL (ref 0.70–1.25)
GFR, Est African American: 90 mL/min/{1.73_m2} (ref >=60)
GFR, Est Non African American: 78 mL/min/{1.73_m2} (ref >=60)
Globulin: 2.4 g/dL (calc) (ref 1.9–3.7)
Glucose, Bld: 97 mg/dL (ref 65–99)
Potassium: 4.5 mmol/L (ref 3.5–5.3)
Sodium: 141 mmol/L (ref 135–146)
Total Bilirubin: 0.3 mg/dL (ref 0.2–1.2)
Total Protein: 6.3 g/dL (ref 6.1–8.1)

## 2020-05-23 LAB — VORICONAZOLE QUANT BY LC/MS: Voriconazole, Quant, by LC/MS: 4.2 ug/mL

## 2020-05-23 LAB — ASPERGILLUS ANTIGEN,SERUM
Aspergillus Ag, EIA: DETECTED — CR
Index Value: 0.87 — ABNORMAL HIGH (ref <0.50)

## 2020-05-24 ENCOUNTER — Telehealth: Payer: Self-pay

## 2020-05-24 NOTE — Telephone Encounter (Signed)
Patient made aware of therapeutic results via voicemail and to continue medication.  Advised patient to contact our office if needed. Eugenia Mcalpine

## 2020-05-24 NOTE — Telephone Encounter (Signed)
-----   Message from Mignon Pine, DO sent at 05/24/2020  2:01 PM EST ----- Can you pls let pt know his voriconazole level is therapeutic and he should continue with current dose.  I will see him back as scheduled in December and repeat labs at that time.  Mitzi Hansen

## 2020-06-02 ENCOUNTER — Other Ambulatory Visit: Payer: Self-pay

## 2020-06-02 ENCOUNTER — Ambulatory Visit: Payer: Medicare Other | Admitting: Pulmonary Disease

## 2020-06-02 ENCOUNTER — Encounter: Payer: Self-pay | Admitting: Pulmonary Disease

## 2020-06-02 VITALS — BP 150/90 | HR 83 | Temp 97.0°F | Ht 67.0 in | Wt 196.6 lb

## 2020-06-02 DIAGNOSIS — J449 Chronic obstructive pulmonary disease, unspecified: Secondary | ICD-10-CM

## 2020-06-02 DIAGNOSIS — B449 Aspergillosis, unspecified: Secondary | ICD-10-CM

## 2020-06-02 DIAGNOSIS — R918 Other nonspecific abnormal finding of lung field: Secondary | ICD-10-CM

## 2020-06-02 NOTE — Patient Instructions (Signed)
I am glad you are doing well with your breathing. Continue the voriconazole We will order a follow-up CT without contrast in 3 months Follow-up in clinic after this.

## 2020-06-02 NOTE — Progress Notes (Signed)
Richard Grimes    [id number]    [birth year]  Primary Care Physician:Copland, Frederico Hamman, MD  Referring Physician: [name], MD [place],  [place] [id number]  Problem list: Follow-up for  Lung nodule, COPD Post Covid invasive aspergillosis, diagnosed with a bronchoscopy and started voriconazole November 7245  HPI: 69 year old with history of asthma as a child, COPD.  Diagnosed with COPD in 2010 with mild symptoms of dyspnea on exertion.  He has productive cough with white mucus, denies any fevers, chills.  He was currently maintained on Dulera and Spiriva with about 1 exacerbation every year.  Switch to Trelegy in 2019 with improvement in symptoms He has seen Dr. 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  Previous exposure to chemicals including fumes, polymers.  Denies any asbestos, Architect exposure.  No mold exposure, no hot tubs. Smoking history: 70-pack-year smoking history.  Quit in 2004. Travel History: No recent travel.  Interim history: Had COVID-19 infection in September 2021 treated with monoclonal antibody and 3 weeks of steroids Developed lung infiltrates with cavitation post Covid.  Underwent bronchoscopy on 05/03/2020 by Dr. Valeta Harms with findings of invasive aspergillosis He has follow-up with ID and started on voriconazole.  Outpatient Encounter Medications as of 06/02/2020  Medication Sig  . aspirin EC 81 MG tablet Take 81 mg by mouth daily. Swallow whole.  Marland Kitchen ECHINACEA EXTRACT PO Take 1,520 mg by mouth at bedtime. 760 mg each   . Fluticasone-Umeclidin-Vilant (TRELEGY ELLIPTA) 100-62.5-25 MCG/INH AEPB INHALE 1 PUFF ONCE DAILY  . L-Arginine 500 MG CAPS Take 1,000 mg by mouth at bedtime.  . levOCARNitine L-Tartrate (L-CARNITINE) 500 MG CAPS Take 500 mg by mouth at bedtime.  .  Multiple Minerals-Vitamins (CAL-MAG-ZINC-D) TABS Take 1,000 mg by mouth at bedtime.   . Multiple Vitamins-Minerals (MULTIVITAMIN WITH MINERALS) tablet Take 1 tablet by mouth daily. Centrum Silver men 50+  . nystatin cream (MYCOSTATIN) APPLY CREAM TOPICALLY TWICE DAILY (Patient taking differently: Apply 1 application topically 3 (three) times a week. )  . omeprazole (PRILOSEC) 20 MG capsule TAKE 1 CAPSULE BY MOUTH TWICE DAILY BEFORE MEAL(S) (Patient taking differently: Take 40 mg by mouth daily. )  . PROVENTIL HFA 108 (90 Base) MCG/ACT inhaler INHALE TWO PUFFS BY MOUTH EVERY 6 HOURS AS NEEDED FOR WHEEZING OR  SHORTNESS  OF  BREATH (Patient taking differently: Inhale 2 puffs into the lungs every 6 (six) hours as needed for wheezing or shortness of breath. )  . triamcinolone cream (KENALOG) 0.1 % Apply 1 application topically 2 (two) times daily as needed. (Patient taking differently: Apply 1 application topically 3 (three) times a week. )  . Turmeric Curcumin 500 MG CAPS Take 500 mg by mouth at bedtime.  . voriconazole (VFEND) 200 MG tablet Take 1.5 tablets (300 mg total) by mouth 2 (two) times daily.   No facility-administered encounter medications on file as of 06/02/2020.   Physical Exam: Blood pressure (!) 150/90, pulse 83, temperature (!) 97 F (36.1 C), temperature source Temporal, height _0  (1.702 m), weight 196 lb 9.6 oz (89.2 kg), SpO2 93 %. Gen:      No acute distress HEENT:  EOMI, sclera anicteric Neck:     No masses; no thyromegaly Lungs:    Clear to auscultation bilaterally; normal respiratory effort CV:  Regular rate and rhythm; no murmurs Abd:      + bowel sounds; soft, non-tender; no palpable masses, no distension Ext:    No edema; adequate peripheral perfusion Skin:      Warm and dry; no rash Neuro: alert and oriented x 3 Psych: normal mood and affect  Data Reviewed: Imaging High-resolution CT 09/04/17-nonspecific basal reticulation.  Aortic, coronary atherosclerosis.   Mild to moderate emphysema.  7 mm right lower lobe nodule. CT chest 04/22/2018-left upper lobe nodule is enlarged to 8 mm, several new pulmonary nodules in both lungs Moderate emphysema, two-vessel coronary atherosclerosis. PET scan 05/01/2018- FDG uptake in the lung nodules.  Faint stranding in the mesentery. CT chest 08/18/2018- decrease in size and number of previously noted pulmonary nodule.  The left upper lobe nodule has decreased to 6 mm. CT chest 08/21/2019-new 5 mm left upper lobe nodule.  Other subcentimeter pulmonary nodules remain stable CT chest 04/21/2020-bilateral nodular opacities with cavitation in the left upper lobe, multiple subcentimeter mediastinal lymph nodes. PET scan 04/26/2020-multifocal FDG avid pulmonary nodules.  No evidence of uptake elsewhere I have reviewed the images personally.  PFTs 09/23/17 FVC 3.74 [89%], FEV1 1.97 [63%], F/F 53, TLC 108%, DLCO 55% Moderate obstruction and diffusion defect  Biodesix Nodify XL 2 April 28, 2018 Pretest probability 16% risk of malignancy Test result- reduced risk of malignancy.  3% risk of malignancy  Labs: Bronchoscopy 05/03/2020 Fungal cultures Aspergillus terrens BAL Aspergillus antigen positive Hyphal elements seen on pathology.  Assessment:  Post Covid invasive aspergillosis Developed invasive aspergillosis after COVID-19 infection.  This has been reported in the literature and probably precipitated by steroid use.  He is currently on voriconazole, followed by ID Voriconazole trough levels show therapeutic range.  Order follow-up CT in 3 months to check response.  Moderate COPD Continues on trelegy with no issues Did not desat on exertion Advised to work on diet, weight loss, exercise We discussed pulmonary rehab however he wants to try an exercise program at home by himself first.  He will call us in case he wants a referral to a rehab program at [facility].  Pulmonary nodule We are following 8 mm left  upper lobe nodule. PET scan does not show uptake he has reduced risk of malignancy on biodesix Follow-up CT scan reviewed which shows new 5 mm nodule that can be followed in 1 year.  Review on follow-up CTs  Plan/Recommendations: - Continue trelegy - CT scan without contrast in 3 months.  Marshell Garfinkel MD [facility] 06/02/2020, 9:03 AM  CC: [name], MD

## 2020-06-06 LAB — FUNGUS CULTURE RESULT

## 2020-06-06 LAB — FUNGUS CULTURE WITH STAIN

## 2020-06-06 LAB — FUNGAL ORGANISM REFLEX

## 2020-06-07 LAB — FUNGAL ORGANISM REFLEX

## 2020-06-07 LAB — FUNGUS CULTURE WITH STAIN

## 2020-06-07 LAB — FUNGUS CULTURE RESULT

## 2020-06-07 MED ORDER — VORICONAZOLE 200 MG PO TABS
300.0000 mg | ORAL_TABLET | Freq: Two times a day (BID) | ORAL | 4 refills | Status: DC
Start: 2020-06-07 — End: 2020-12-14

## 2020-06-08 LAB — FUNGUS CULTURE WITH STAIN

## 2020-06-08 LAB — FUNGAL ORGANISM REFLEX

## 2020-06-08 LAB — FUNGUS CULTURE RESULT

## 2020-06-16 LAB — ACID FAST CULTURE WITH REFLEXED SENSITIVITIES (MYCOBACTERIA)
Acid Fast Culture: NEGATIVE
Acid Fast Culture: NEGATIVE

## 2020-06-20 ENCOUNTER — Encounter: Payer: Self-pay | Admitting: Internal Medicine

## 2020-06-20 ENCOUNTER — Other Ambulatory Visit: Payer: Self-pay

## 2020-06-20 ENCOUNTER — Ambulatory Visit: Payer: Medicare Other | Admitting: Internal Medicine

## 2020-06-20 VITALS — BP 150/87 | HR 81 | Temp 98.1°F | Wt 200.4 lb

## 2020-06-20 DIAGNOSIS — B441 Other pulmonary aspergillosis: Secondary | ICD-10-CM | POA: Diagnosis not present

## 2020-06-20 NOTE — Assessment & Plan Note (Addendum)
Last seen by me on 05/13/20.   Started voriconazole therapy 300mg  BID on 05/11/20.  Voriconazole level 05/18/20 = 4.2 and normal labs otherwise.  Here today for follow up and doing well on therapy.  Saw pulmonology earlier this month.    He has been doing well on voriconazole therapy thus far.  Last dose was about 10pm last evening.  Visual disturbances noted last month have resolved after cutting back on nicotine gum.  He has been having increased sweating in groin area but no other new rashes.  He has been using topical Nystatin powder/cream for this.  Not having further hemoptysis or cough or fevers.   Plan: -- continue voriconazole 300mg  BID -- repeat vori level, CBC, CMP -- repeat serum Aspergillus Ag -- will get repeat CT scan  -- RTC about 4-6 weeks

## 2020-06-20 NOTE — Patient Instructions (Signed)
Thank you for coming to see me today. It was a pleasure seeing you.  Continue your voriconazole as prescribed.  We will get labs today and a repeat CT chest in a couple weeks.   If you have any questions or concerns, please do not hesitate to call the office at [phone].  Take Care,   Jule Ser, DO

## 2020-06-20 NOTE — Progress Notes (Signed)
[facility]  Chief Complaint:  Follow up for pulmonary aspergillosis  Subjective: Richard Grimes is a 69 y.o. male with PMHx as below who presents to the clinic for follow up of pulmonary aspergillosis.   Please see A&P for the details of today's visit and status of the patient's medical problems.   Patient's Medications  New Prescriptions   No medications on file  Previous Medications   ASPIRIN EC 81 MG TABLET    Take 81 mg by mouth daily. Swallow whole.   ECHINACEA EXTRACT PO    Take 1,520 mg by mouth at bedtime. 760 mg each   FLUTICASONE-UMECLIDIN-VILANT (TRELEGY ELLIPTA) 100-62.5-25 MCG/INH AEPB    INHALE 1 PUFF ONCE DAILY   L-ARGININE 500 MG CAPS    Take 1,000 mg by mouth at bedtime.   LEVOCARNITINE L-TARTRATE (L-CARNITINE) 500 MG CAPS    Take 500 mg by mouth at bedtime.   MULTIPLE MINERALS-VITAMINS (CAL-MAG-ZINC-D) TABS    Take 1,000 mg by mouth at bedtime.    MULTIPLE VITAMINS-MINERALS (MULTIVITAMIN WITH MINERALS) TABLET    Take 1 tablet by mouth daily. Centrum Silver men 50+   NYSTATIN CREAM (MYCOSTATIN)    APPLY CREAM TOPICALLY TWICE DAILY   OMEPRAZOLE (PRILOSEC) 20 MG CAPSULE    TAKE 1 CAPSULE BY MOUTH TWICE DAILY BEFORE MEAL(S)   PROVENTIL HFA 108 (90 BASE) MCG/ACT INHALER    INHALE TWO PUFFS BY MOUTH EVERY 6 HOURS AS NEEDED FOR WHEEZING OR  SHORTNESS  OF  BREATH   TRIAMCINOLONE CREAM (KENALOG) 0.1 %    Apply 1 application topically 2 (two) times daily as needed.   TURMERIC CURCUMIN 500 MG CAPS    Take 500 mg by mouth at bedtime.   VORICONAZOLE (VFEND) 200 MG TABLET    Take 1.5 tablets (300 mg total) by mouth 2 (two) times daily.  Modified Medications   No medications on file  Discontinued Medications   No medications on file     Past Medical History:  Diagnosis Date  . Allergic rhinitis due to pollen 07/18/2011  . Allergy   . Asthma    as a child  . Bronchitis    history  . COPD (chronic obstructive pulmonary disease) (HCC)     Severe, based on Spirometry.   . Depression   . Emphysema/COPD ([place])   . Family history of colonic polyps   . Family history of lung cancer   . Family history of lymphoma   . Family history of melanoma   . Family history of skin cancer   . GERD (gastroesophageal reflux disease)   . History of colonic polyps 11/17/2019  . Hx of adenomatous colonic polyps   . Hyperlipidemia   . Iron deficiency anemia   . Pneumonia    x 1    Family History  Problem Relation Age of Onset  . Pulmonary fibrosis Mother   . GER disease Mother   . Hypertension Mother   . Colon polyps Mother   . Hypertension Father   . Melanoma Father 67       lower back  . Skin cancer Father 68       lip  . Alzheimer's disease Maternal Grandmother   . Deep vein thrombosis Maternal Grandfather   . Lymphoma Maternal Grandfather 76  . Deep vein thrombosis Paternal Grandmother   . Deep vein thrombosis Paternal Grandfather   . Skin cancer Paternal Grandfather  lip; dx. in his 39s  . Colon polyps Maternal Uncle   . Lung cancer Cousin 77       smoker (maternal cousin)  . Colon cancer Neg Hx   . Esophageal cancer Neg Hx   . Rectal cancer Neg Hx   . Stomach cancer Neg Hx     Social History   Socioeconomic History  . Marital status: Married    Spouse name: [name]  . Number of children: Not on file  . Years of education: Not on file  . Highest education level: Not on file  Occupational History  . Occupation: fumigation    Employer: [employer]  Tobacco Use  . Smoking status: Former Smoker    Packs/day: 2.00    Years: 35.00    Pack years: 70.00    Types: Cigarettes    Quit date: 07/02/2004    Years since quitting: 15.9  . Smokeless tobacco: Never Used  Vaping Use  . Vaping Use: Never used  Substance and Sexual Activity  . Alcohol use: Yes    Alcohol/week: 0.0 standard drinks    Comment: occ glass on wine, or beer  . Drug use: No  . Sexual activity: Yes    Partners: Female  Other  Topics Concern  . Not on file  Social History Narrative  . Not on file   Social Determinants of Health   Financial Resource Strain: Low Risk   . Difficulty of Paying Living Expenses: Not hard at all  Food Insecurity: No Food Insecurity  . Worried About Charity fundraiser in the Last Year: Never true  . Ran Out of Food in the Last Year: Never true  Transportation Needs: No Transportation Needs  . Lack of Transportation (Medical): No  . Lack of Transportation (Non-Medical): No  Physical Activity: Inactive  . Days of Exercise per Week: 0 days  . Minutes of Exercise per Session: 0 min  Stress: No Stress Concern Present  . Feeling of Stress : Not at all  Social Connections: Not on file  Intimate Partner Violence: Not At Risk  . Fear of Current or Ex-Partner: No  . Emotionally Abused: No  . Physically Abused: No  . Sexually Abused: No    Allergies  Allergen Reactions  . Penicillins Rash    Reaction: 50 Years ago     Review of Systems: Review of Systems  Constitutional: Negative for chills and fever.  HENT: Negative.   Eyes:       Vision changes have improved  Respiratory: Negative for cough and hemoptysis.   Skin: Negative.   Neurological: Negative.   Endo/Heme/Allergies:       Increased sweating  Psychiatric/Behavioral: Negative.     Objective: Vitals:   06/20/20 0842  BP: (!) 150/87  Pulse: 81  Temp: 98.1 F (36.7 C)  TempSrc: Oral  Weight: 200 lb 6.4 oz (90.9 kg)   Body mass index is 31.39 kg/m.  Physical Exam Constitutional:      General: He is not in acute distress.    Appearance: Normal appearance.  HENT:     Head: Normocephalic and atraumatic.  Pulmonary:     Effort: Pulmonary effort is normal. No respiratory distress.  Skin:    General: Skin is warm and dry.  Neurological:     General: No focal deficit present.     Mental Status: He is alert and oriented to person, place, and time.  Psychiatric:        Mood and Affect:  Mood normal.         Behavior: Behavior normal.       Pertinent Labs and Microbiology CBC Latest Ref Rng & Units 05/18/2020 05/03/2020 05/03/2020  WBC 3.8 - 10.8 Thousand/uL 9.0 - 6.6  Hemoglobin 13.2 - 17.1 g/dL 13.6 13.6 14.3  Hematocrit 38.5 - 50.0 % 42.0 40.0 45.6  Platelets 140 - 400 Thousand/uL 306 - 344   CMP Latest Ref Rng & Units 05/18/2020 05/03/2020 04/21/2020  Glucose 65 - 99 mg/dL 97 93 -  BUN 7 - 25 mg/dL 14 11 -  Creatinine 0.70 - 1.25 mg/dL 1.00 0.90 0.80  Sodium 135 - 146 mmol/L 141 142 -  Potassium 3.5 - 5.3 mmol/L 4.5 4.5 -  Chloride 98 - 110 mmol/L 105 105 -  CO2 20 - 32 mmol/L 29 - -  Calcium 8.6 - 10.3 mg/dL 9.2 - -  Total Protein 6.1 - 8.1 g/dL 6.3 - -  Total Bilirubin 0.2 - 1.2 mg/dL 0.3 - -  Alkaline Phos 39 - 117 U/L - - -  AST 10 - 35 U/L 19 - -  ALT 9 - 46 U/L 16 - -     No results found for this or any previous visit (from the past 240 hour(s)).  Imaging CT CHEST October 2021: IMPRESSION: 1. Underlying emphysematous change. Nodular opacities noted throughout the lungs, likely of neoplastic etiology. A nodular lesion in the left upper lobe anteriorly shows cavitation. No other cavitated lesions evident. This finding may well warrant bronchoscopic evaluation with cytologic assessment.  2. Multiple subcentimeter mediastinal lymph nodes. Single borderline prominent right hilar lymph node which may well have neoplastic etiology given the findings within the lungs.  3.  There is a degree of lower lobe bronchiectatic change.  4. Foci of aortic atherosclerosis and coronary artery calcification.  5.  Suspect adherent gallstone along the fundus of the gallbladder.  Assessment and Plan: Pulmonary aspergillosis ([place]) Last seen by me on 05/13/20.   Started voriconazole therapy 300mg  BID on 05/11/20.  Voriconazole level 05/18/20 = 4.2 and normal labs otherwise.  Here today for follow up and doing well on therapy.  Saw pulmonology earlier this month.    He has been  doing well on voriconazole therapy thus far.  Last dose was about 10pm last evening.  Visual disturbances noted last month have resolved after cutting back on nicotine gum.  He has been having increased sweating in groin area but no other new rashes.  He has been using topical Nystatin powder/cream for this.  Not having further hemoptysis or cough or fevers.   Plan: -- continue voriconazole 300mg  BID -- repeat vori level, CBC, CMP -- repeat serum Aspergillus Ag -- will get repeat CT scan  -- RTC about 4-6 weeks    Orders Placed This Encounter  Procedures  . CT CHEST WO CONTRAST    Standing Status:   Future    Standing Expiration Date:   06/20/2021    Order Specific Question:   Preferred imaging location?    Answer:   [facility]  . CBC  . COMPLETE METABOLIC PANEL WITH GFR  . Voriconazole, Quant by LC/MS  . Aspergillus Antigen, Serum      Raynelle [facility] for [facility] 06/20/2020, 9:11 AM

## 2020-06-21 LAB — ACID FAST CULTURE WITH REFLEXED SENSITIVITIES (MYCOBACTERIA)
Acid Fast Culture: NEGATIVE
Acid Fast Culture: NEGATIVE
Acid Fast Culture: NEGATIVE

## 2020-06-23 ENCOUNTER — Telehealth: Payer: Self-pay

## 2020-06-23 NOTE — Telephone Encounter (Signed)
Spoke with the patient to let him know that per Dr. Juleen China, his liver & kidney function and CBC all look normal. RN advised him we are still waiting on results of voriconazole level and aspergillus antigen and that we will update him once we have those results. Advised patient Dr. Juleen China would like for him to continue the voriconazole as prescribed. Patient verbalized understanding and has no further questions.   Beryle Flock, RN

## 2020-06-23 NOTE — Telephone Encounter (Signed)
-----   Message from Mignon Pine, DO sent at 06/23/2020  9:14 AM EST ----- Can you pls let pt know that kidney and liver function look normal.  CBC also normal.  I am still waiting on the results of his voriconazole level and aspergillus antigen and will update him once I have that.  In the interim, continue taking voriconazole as prescribed.   Thanks!

## 2020-06-25 LAB — COMPLETE METABOLIC PANEL WITH GFR
AG Ratio: 1.7 (calc) (ref 1.0–2.5)
ALT: 27 U/L (ref 9–46)
AST: 28 U/L (ref 10–35)
Albumin: 4 g/dL (ref 3.6–5.1)
Alkaline phosphatase (APISO): 154 U/L — ABNORMAL HIGH (ref 35–144)
BUN: 11 mg/dL (ref 7–25)
CO2: 27 mmol/L (ref 20–32)
Calcium: 9.3 mg/dL (ref 8.6–10.3)
Chloride: 107 mmol/L (ref 98–110)
Creat: 0.96 mg/dL (ref 0.70–1.25)
GFR, Est African American: 94 mL/min/{1.73_m2} (ref >=60)
GFR, Est Non African American: 81 mL/min/{1.73_m2} (ref >=60)
Globulin: 2.4 g/dL (calc) (ref 1.9–3.7)
Glucose, Bld: 91 mg/dL (ref 65–99)
Potassium: 4.7 mmol/L (ref 3.5–5.3)
Sodium: 141 mmol/L (ref 135–146)
Total Bilirubin: 0.3 mg/dL (ref 0.2–1.2)
Total Protein: 6.4 g/dL (ref 6.1–8.1)

## 2020-06-25 LAB — VORICONAZOLE QUANT BY LC/MS: Voriconazole, Quant, by LC/MS: 2.9 ug/mL

## 2020-06-25 LAB — CBC
HCT: 45.1 % (ref 38.5–50.0)
Hemoglobin: 14.6 g/dL (ref 13.2–17.1)
MCH: 26.4 pg — ABNORMAL LOW (ref 27.0–33.0)
MCHC: 32.4 g/dL (ref 32.0–36.0)
MCV: 81.6 fL (ref 80.0–100.0)
MPV: 9.8 fL (ref 7.5–12.5)
Platelets: 295 10*3/uL (ref 140–400)
RBC: 5.53 10*6/uL (ref 4.20–5.80)
RDW: 14.7 % (ref 11.0–15.0)
WBC: 7.9 10*3/uL (ref 3.8–10.8)

## 2020-06-25 LAB — ASPERGILLUS ANTIGEN,SERUM
Aspergillus Ag, EIA: NOT DETECTED
Index Value: 0.09 (ref <0.50)

## 2020-06-27 ENCOUNTER — Encounter: Payer: Self-pay | Admitting: Family Medicine

## 2020-06-28 MED ORDER — DOXYCYCLINE HYCLATE 100 MG PO TABS: 100.0000 mg | ORAL_TABLET | Freq: Two times a day (BID) | ORAL | 0 refills | Status: AC

## 2020-07-04 ENCOUNTER — Ambulatory Visit
Admission: RE | Admit: 2020-07-04 | Discharge: 2020-07-04 | Disposition: A | Discharge status: Home / Self Care | Payer: Medicare Other | Source: Ambulatory Visit | Attending: Internal Medicine | Admitting: Internal Medicine

## 2020-07-04 ENCOUNTER — Other Ambulatory Visit: Payer: Self-pay

## 2020-07-04 DIAGNOSIS — B441 Other pulmonary aspergillosis: Secondary | ICD-10-CM | POA: Diagnosis not present

## 2020-07-04 DIAGNOSIS — R0602 Shortness of breath: Secondary | ICD-10-CM | POA: Diagnosis not present

## 2020-07-06 NOTE — Telephone Encounter (Signed)
Please advise on patient mychart message    Dr. Terese Door Please review this CT scan and let me know what you think.  This scan was ordered by Dr. Gwynn Burly at Infectious Disease. Thank you, Susa Loffler

## 2020-07-07 NOTE — Telephone Encounter (Signed)
Overall it seems better though worse in one part.   Continue current treatment.

## 2020-07-12 ENCOUNTER — Other Ambulatory Visit: Payer: Self-pay

## 2020-07-12 DIAGNOSIS — D508 Other iron deficiency anemias: Secondary | ICD-10-CM

## 2020-07-12 MED ORDER — OMEPRAZOLE 20 MG PO CPDR: 40.0000 mg | DELAYED_RELEASE_CAPSULE | Freq: Every day | ORAL | 0 refills | Status: DC

## 2020-08-01 ENCOUNTER — Ambulatory Visit: Payer: Medicare Other | Admitting: Internal Medicine

## 2020-08-01 ENCOUNTER — Other Ambulatory Visit: Payer: Self-pay

## 2020-08-01 ENCOUNTER — Encounter: Payer: Self-pay | Admitting: Internal Medicine

## 2020-08-01 VITALS — BP 161/94 | HR 88 | Temp 98.2°F | Wt 195.0 lb

## 2020-08-01 DIAGNOSIS — J431 Panlobular emphysema: Secondary | ICD-10-CM | POA: Diagnosis not present

## 2020-08-01 DIAGNOSIS — B441 Other pulmonary aspergillosis: Secondary | ICD-10-CM | POA: Diagnosis not present

## 2020-08-01 DIAGNOSIS — Z5181 Encounter for therapeutic drug level monitoring: Secondary | ICD-10-CM | POA: Diagnosis not present

## 2020-08-01 NOTE — Assessment & Plan Note (Signed)
Currently on voriconazole for pulmonary aspergillus.  Labs have been stable.  PLAN: . Continue monitoring labs

## 2020-08-01 NOTE — Patient Instructions (Signed)
Thank you for coming to see me today. It was a pleasure seeing you.  To Do: Marland Kitchen Labs today . Continue voriconazole . Follow up with me in 4 weeks  If you have any questions or concerns, please do not hesitate to call the office at [phone].  Take Care,   Jule Ser, DO

## 2020-08-01 NOTE — Progress Notes (Signed)
[facility]  CHIEF COMPLAINT:    Follow up for pulmonary aspergillus  SUBJECTIVE:    Richard Grimes is a 70 y.o. male with PMHx as below who presents to the clinic for follow up of pulmonary aspergillus.   He has been doing well on voriconazole thus far.  CT scan done earlier this month as noted below.  His episode of congestion and cough has resolved after a course of doxycycline.  No fevers, chills.  Visual changes as noted at beginning of treatment are better.  He has noted some weight gain and elevated BP that may be secondary to voriconazole.  Last dose of voriconazole was about 11 hrs ago.   Please see A&P for the details of today's visit and status of the patient's medical problems.   Patient's Medications  New Prescriptions   No medications on file  Previous Medications   ASPIRIN EC 81 MG TABLET    Take 81 mg by mouth daily. Swallow whole.   ECHINACEA EXTRACT PO    Take 1,520 mg by mouth at bedtime. 760 mg each   FLUTICASONE-UMECLIDIN-VILANT (TRELEGY ELLIPTA) 100-62.5-25 MCG/INH AEPB    INHALE 1 PUFF ONCE DAILY   L-ARGININE 500 MG CAPS    Take 1,000 mg by mouth at bedtime.   LEVOCARNITINE L-TARTRATE (L-CARNITINE) 500 MG CAPS    Take 500 mg by mouth at bedtime.   MULTIPLE MINERALS-VITAMINS (CAL-MAG-ZINC-D) TABS    Take 1,000 mg by mouth at bedtime.    MULTIPLE VITAMINS-MINERALS (MULTIVITAMIN WITH MINERALS) TABLET    Take 1 tablet by mouth daily. Centrum Silver men 50+   NYSTATIN CREAM (MYCOSTATIN)    APPLY CREAM TOPICALLY TWICE DAILY   NYSTATIN POWDER    SMARTSIG:1 Application Topical 2-3 Times Daily   OMEPRAZOLE (PRILOSEC) 20 MG CAPSULE    Take 2 capsules (40 mg total) by mouth daily.   PROVENTIL HFA 108 (90 BASE) MCG/ACT INHALER    INHALE TWO PUFFS BY MOUTH EVERY 6 HOURS AS NEEDED FOR WHEEZING OR  SHORTNESS  OF  BREATH   TRIAMCINOLONE CREAM (KENALOG) 0.1 %    Apply 1 application topically 2 (two) times daily as needed.   TURMERIC CURCUMIN  500 MG CAPS    Take 500 mg by mouth at bedtime.   VORICONAZOLE (VFEND) 200 MG TABLET    Take 1.5 tablets (300 mg total) by mouth 2 (two) times daily.  Modified Medications   No medications on file  Discontinued Medications   No medications on file      Past Medical History:  Diagnosis Date  . Allergic rhinitis due to pollen 07/18/2011  . Allergy   . Asthma    as a child  . Bronchitis    history  . COPD (chronic obstructive pulmonary disease) (HCC)    Severe, based on Spirometry.   . Depression   . Emphysema/COPD ([place])   . Family history of colonic polyps   . Family history of lung cancer   . Family history of lymphoma   . Family history of melanoma   . Family history of skin cancer   . GERD (gastroesophageal reflux disease)   . History of colonic polyps 11/17/2019  . Hx of adenomatous colonic polyps   . Hyperlipidemia   . Iron deficiency anemia   . Pneumonia    x 1    Social History   Tobacco Use  . Smoking status: Former Smoker    Packs/day: 2.00  Years: 35.00    Pack years: 70.00    Types: Cigarettes    Quit date: 07/02/2004    Years since quitting: 16.0  . Smokeless tobacco: Never Used  Vaping Use  . Vaping Use: Never used  Substance Use Topics  . Alcohol use: Yes    Alcohol/week: 0.0 standard drinks    Comment: occ glass on wine, or beer  . Drug use: No    Family History  Problem Relation Age of Onset  . Pulmonary fibrosis Mother   . GER disease Mother   . Hypertension Mother   . Colon polyps Mother   . Hypertension Father   . Melanoma Father 6       lower back  . Skin cancer Father 38       lip  . Alzheimer's disease Maternal Grandmother   . Deep vein thrombosis Maternal Grandfather   . Lymphoma Maternal Grandfather 30  . Deep vein thrombosis Paternal Grandmother   . Deep vein thrombosis Paternal Grandfather   . Skin cancer Paternal Grandfather        lip; dx. in his 53s  . Colon polyps Maternal Uncle   . Lung cancer Cousin 56       smoker  (maternal cousin)  . Colon cancer Neg Hx   . Esophageal cancer Neg Hx   . Rectal cancer Neg Hx   . Stomach cancer Neg Hx     Allergies  Allergen Reactions  . Penicillins Rash    Reaction: 50 Years ago    Review of Systems  Constitutional: Negative for chills and fever.       + weight gain  Eyes: Negative for blurred vision and photophobia.  Respiratory: Negative.   Cardiovascular: Negative.      OBJECTIVE:    Vitals:   08/01/20 0947  BP: (!) 161/94  Pulse: 88  Temp: 98.2 F (36.8 C)  TempSrc: Oral  SpO2: 95%  Weight: 195 lb (88.5 kg)   Body mass index is 30.54 kg/m.  Physical Exam Constitutional:      General: He is not in acute distress.    Appearance: Normal appearance.  HENT:     Head: Normocephalic and atraumatic.  Eyes:     Extraocular Movements: Extraocular movements intact.     Conjunctiva/sclera: Conjunctivae normal.  Pulmonary:     Effort: Pulmonary effort is normal. No respiratory distress.     Breath sounds: Normal breath sounds. No wheezing.  Neurological:     General: No focal deficit present.     Mental Status: He is alert and oriented to person, place, and time.  Psychiatric:        Mood and Affect: Mood normal.        Behavior: Behavior normal.      Labs and Microbiology: CBC Latest Ref Rng & Units 06/20/2020 05/18/2020 05/03/2020  WBC 3.8 - 10.8 Thousand/uL 7.9 9.0 -  Hemoglobin 13.2 - 17.1 g/dL 14.6 13.6 13.6  Hematocrit 38.5 - 50.0 % 45.1 42.0 40.0  Platelets 140 - 400 Thousand/uL 295 306 -   CMP Latest Ref Rng & Units 06/20/2020 05/18/2020 05/03/2020  Glucose 65 - 99 mg/dL 91 97 93  BUN 7 - 25 mg/dL 11 14 11   Creatinine 0.70 - 1.25 mg/dL 0.96 1.00 0.90  Sodium 135 - 146 mmol/L 141 141 142  Potassium 3.5 - 5.3 mmol/L 4.7 4.5 4.5  Chloride 98 - 110 mmol/L 107 105 105  CO2 20 - 32 mmol/L 27 29 -  Calcium  8.6 - 10.3 mg/dL 9.3 9.2 -  Total Protein 6.1 - 8.1 g/dL 6.4 6.3 -  Total Bilirubin 0.2 - 1.2 mg/dL 0.3 0.3 -  Alkaline  Phos 39 - 117 U/L - - -  AST 10 - 35 U/L 28 19 -  ALT 9 - 46 U/L 27 16 -     No results found for this or any previous visit (from the past 240 hour(s)).  Imaging: IMPRESSION: 1. Redemonstration of bilateral pulmonary nodularity, primarily similar and slightly decreased as detailed above. A cavitary lingular lesion is progressive. 2. No thoracic adenopathy. 3. Aortic atherosclerosis (ICD10-I70.0), coronary artery atherosclerosis and emphysema (ICD10-J43.9). 4. 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 CT scan obtained on 07/05/20 showed bilateral pulmonary nodularity primarily similar and slightly decreased.  There was near complete resolution of the index pleural-based left upper lobe 3.3 x 1.8 cm mass.  A cavitary lingular lesion was slightly progressive which I do not think is totally unexpected.  His voriconazole levels have been therapeutic and monitoring labs stable.  His serum Aspergillus antigen is now not detectable.  He has been on therapy now since 05/11/20 and 08/03/20 will be about 12 weeks of treatment.    PLAN: . Continue voriconazole 300mg  BID  . Voriconazole level, serum aspergillus Ag, CBC, CMP . Plan for about 4 more weeks of therapy to complete 16 weeks total and then would like to get a follow up CT at that point.  Assuming things look good/stable will then plan to monitor off therapy and follow clinically with serial CT imaging off antifungals.  Hopefully he will do okay since there is no ongoing and longterm immunosuppression . Monitor weight and BP . RTC 4 weeks   COPD (chronic obstructive pulmonary disease) with emphysema (HCC) History of moderate COPD followed by pulmonology.  Appears to have recovered from mild exacerbation earlier this month and currently asymptomatic.  PLAN: . Continued pulmonary follow up and continue maintenance COPD medications   Therapeutic drug monitoring Currently on  voriconazole for pulmonary aspergillus.  Labs have been stable.  PLAN: . Continue monitoring labs    Orders Placed This Encounter  Procedures  . CBC  . COMPLETE METABOLIC PANEL WITH GFR  . Voriconazole, Quant by LC/MS  . Aspergillus Antigen, Serum       [facility] 08/01/2020, 10:36 AM

## 2020-08-01 NOTE — Assessment & Plan Note (Signed)
Last seen by me on 06/20/20. CT scan obtained on 07/05/20 showed bilateral pulmonary nodularity primarily similar and slightly decreased.  There was near complete resolution of the index pleural-based left upper lobe 3.3 x 1.8 cm mass.  A cavitary lingular lesion was slightly progressive which I do not think is totally unexpected.  His voriconazole levels have been therapeutic and monitoring labs stable.  His serum Aspergillus antigen is now not detectable.  He has been on therapy now since 05/11/20 and 08/03/20 will be about 12 weeks of treatment.    PLAN: . Continue voriconazole 300mg  BID  . Voriconazole level, serum aspergillus Ag, CBC, CMP . Plan for about 4 more weeks of therapy to complete 16 weeks total and then would like to get a follow up CT at that point.  Assuming things look good/stable will then plan to monitor off therapy and follow clinically with serial CT imaging off antifungals.  Hopefully he will do okay since there is no ongoing and longterm immunosuppression . Monitor weight and BP . RTC 4 weeks

## 2020-08-01 NOTE — Assessment & Plan Note (Signed)
History of moderate COPD followed by pulmonology.  Appears to have recovered from mild exacerbation earlier this month and currently asymptomatic.  PLAN: . Continued pulmonary follow up and continue maintenance COPD medications

## 2020-08-05 LAB — COMPLETE METABOLIC PANEL WITH GFR
AG Ratio: 1.7 (calc) (ref 1.0–2.5)
ALT: 69 U/L — ABNORMAL HIGH (ref 9–46)
AST: 67 U/L — ABNORMAL HIGH (ref 10–35)
Albumin: 4 g/dL (ref 3.6–5.1)
Alkaline phosphatase (APISO): 258 U/L — ABNORMAL HIGH (ref 35–144)
BUN: 12 mg/dL (ref 7–25)
CO2: 27 mmol/L (ref 20–32)
Calcium: 9.4 mg/dL (ref 8.6–10.3)
Chloride: 107 mmol/L (ref 98–110)
Creat: 0.91 mg/dL (ref 0.70–1.25)
GFR, Est African American: 101 mL/min/{1.73_m2} (ref >=60)
GFR, Est Non African American: 87 mL/min/{1.73_m2} (ref >=60)
Globulin: 2.3 g/dL (calc) (ref 1.9–3.7)
Glucose, Bld: 92 mg/dL (ref 65–99)
Potassium: 4.5 mmol/L (ref 3.5–5.3)
Sodium: 142 mmol/L (ref 135–146)
Total Bilirubin: 0.3 mg/dL (ref 0.2–1.2)
Total Protein: 6.3 g/dL (ref 6.1–8.1)

## 2020-08-05 LAB — CBC
HCT: 44.9 % (ref 38.5–50.0)
Hemoglobin: 14.3 g/dL (ref 13.2–17.1)
MCH: 25.8 pg — ABNORMAL LOW (ref 27.0–33.0)
MCHC: 31.8 g/dL — ABNORMAL LOW (ref 32.0–36.0)
MCV: 80.9 fL (ref 80.0–100.0)
MPV: 9.8 fL (ref 7.5–12.5)
Platelets: 217 10*3/uL (ref 140–400)
RBC: 5.55 10*6/uL (ref 4.20–5.80)
RDW: 14 % (ref 11.0–15.0)
WBC: 5.7 10*3/uL (ref 3.8–10.8)

## 2020-08-05 LAB — ASPERGILLUS ANTIGEN,SERUM
Aspergillus Ag, EIA: NOT DETECTED
Index Value: 0.18 (ref <0.50)

## 2020-08-05 LAB — VORICONAZOLE QUANT BY LC/MS: Voriconazole, Quant, by LC/MS: 3.2 ug/mL

## 2020-08-08 ENCOUNTER — Telehealth: Payer: Self-pay

## 2020-08-08 NOTE — Telephone Encounter (Signed)
-----   Message from Mignon Pine, DO sent at 08/08/2020  3:44 PM EST ----- Can you please let patient know that his voriconazole levels are therapeutic and aspergillus antigen negative.  LFTs are up just a little bit which can happen with voriconazole but they are not at a dangerous level. It is okay to continue his treatment and follow up as scheduled.

## 2020-08-08 NOTE — Telephone Encounter (Signed)
RN spoke with patient to let him know per Dr. Juleen China that his voriconazole levels are within a therapeutic range and that his aspergillus antigen was negative. Advised him that Dr. Juleen China said his liver enzymes were slightly elevated, which can be due to the voriconazole, but that they are not at a dangerous level. Instructed patient that Dr. Juleen China says to continue treatment and follow up as scheduled. Patient verbalized understanding.   Patient mentioned he has had elevated blood pressure (systolic 048-889) a couple of times since starting the voriconazole, and that he thinks this is causing his dull headaches. He also mentioned that his lower legs and feet have developed a slightly pink tint to them. Patient denies being in any pain and states he is "functional." he says he will reach out to [place] if anything "crazy" starts to develop.   Beryle Flock, RN

## 2020-08-29 ENCOUNTER — Encounter: Payer: Self-pay | Admitting: Internal Medicine

## 2020-08-29 ENCOUNTER — Ambulatory Visit: Payer: Medicare Other | Admitting: Internal Medicine

## 2020-08-29 ENCOUNTER — Other Ambulatory Visit: Payer: Self-pay

## 2020-08-29 VITALS — BP 149/87 | HR 78 | Temp 98.0°F | Ht 68.0 in | Wt 201.0 lb

## 2020-08-29 DIAGNOSIS — Z5181 Encounter for therapeutic drug level monitoring: Secondary | ICD-10-CM | POA: Diagnosis not present

## 2020-08-29 DIAGNOSIS — Z8616 Personal history of COVID-19: Secondary | ICD-10-CM

## 2020-08-29 DIAGNOSIS — B441 Other pulmonary aspergillosis: Secondary | ICD-10-CM

## 2020-08-29 DIAGNOSIS — R7989 Other specified abnormal findings of blood chemistry: Secondary | ICD-10-CM

## 2020-08-29 NOTE — Assessment & Plan Note (Signed)
His LFTs last time were slightly elevated on voriconazole therapy.  Will repeat again today to ensure stability and/or trending down.  Will also be checking serum Aspergillus Ag.

## 2020-08-29 NOTE — Patient Instructions (Signed)
Thank you for coming to see me today. It was a pleasure seeing you.  To Do: Marland Kitchen Continue taking your voriconazole through March 2nd then stop . Labs today . Get repeat CT scan in the next couple weeks . Follow up with me in about 3 months  If you have any questions or concerns, please do not hesitate to call the office at [phone].  Take Care,   Jule Ser, DO

## 2020-08-29 NOTE — Progress Notes (Signed)
[facility]  CHIEF COMPLAINT:    Follow up for pulmonary aspergillus.  SUBJECTIVE:    Richard Grimes is a 70 y.o. male with PMHx as below who presents to the clinic for pulmonary aspergillus.   He continues to do well and overall feeling well on voriconazole therapy now at about 16 weeks of treatment.  Not having cough, fevers, hemoptysis.  He does occasionally have some regurgitation.  He is looking forward to going back to work.    Please see A&P for the details of today's visit and status of the patient's medical problems.   Patient's Medications  New Prescriptions   No medications on file  Previous Medications   ASPIRIN EC 81 MG TABLET    Take 81 mg by mouth daily. Swallow whole.   ECHINACEA EXTRACT PO    Take 1,520 mg by mouth at bedtime. 760 mg each   FLUTICASONE-UMECLIDIN-VILANT (TRELEGY ELLIPTA) 100-62.5-25 MCG/INH AEPB    INHALE 1 PUFF ONCE DAILY   L-ARGININE 500 MG CAPS    Take 1,000 mg by mouth at bedtime.   LEVOCARNITINE L-TARTRATE (L-CARNITINE) 500 MG CAPS    Take 500 mg by mouth at bedtime.   MULTIPLE MINERALS-VITAMINS (CAL-MAG-ZINC-D) TABS    Take 1,000 mg by mouth at bedtime.    MULTIPLE VITAMINS-MINERALS (MULTIVITAMIN WITH MINERALS) TABLET    Take 1 tablet by mouth daily. Centrum Silver men 50+   NYSTATIN CREAM (MYCOSTATIN)    APPLY CREAM TOPICALLY TWICE DAILY   NYSTATIN POWDER    SMARTSIG:1 Application Topical 2-3 Times Daily   OMEPRAZOLE (PRILOSEC) 20 MG CAPSULE    Take 2 capsules (40 mg total) by mouth daily.   PROVENTIL HFA 108 (90 BASE) MCG/ACT INHALER    INHALE TWO PUFFS BY MOUTH EVERY 6 HOURS AS NEEDED FOR WHEEZING OR  SHORTNESS  OF  BREATH   TRIAMCINOLONE CREAM (KENALOG) 0.1 %    Apply 1 application topically 2 (two) times daily as needed.   TURMERIC CURCUMIN 500 MG CAPS    Take 500 mg by mouth at bedtime.   VORICONAZOLE (VFEND) 200 MG TABLET    Take 1.5 tablets (300 mg total) by mouth 2 (two) times daily.  Modified  Medications   No medications on file  Discontinued Medications   No medications on file      Past Medical History:  Diagnosis Date  . Allergic rhinitis due to pollen 07/18/2011  . Allergy   . Asthma    as a child  . Bronchitis    history  . COPD (chronic obstructive pulmonary disease) (HCC)    Severe, based on Spirometry.   . Depression   . Emphysema/COPD ([place])   . Family history of colonic polyps   . Family history of lung cancer   . Family history of lymphoma   . Family history of melanoma   . Family history of skin cancer   . GERD (gastroesophageal reflux disease)   . History of colonic polyps 11/17/2019  . Hx of adenomatous colonic polyps   . Hyperlipidemia   . Iron deficiency anemia   . Pneumonia    x 1    Social History   Tobacco Use  . Smoking status: Former Smoker    Packs/day: 2.00    Years: 35.00    Pack years: 70.00    Types: Cigarettes    Quit date: 07/02/2004    Years since quitting: 16.1  . Smokeless tobacco: Never  Used  Vaping Use  . Vaping Use: Never used  Substance Use Topics  . Alcohol use: Yes    Alcohol/week: 0.0 standard drinks    Comment: occ glass on wine, or beer  . Drug use: No    Family History  Problem Relation Age of Onset  . Pulmonary fibrosis Mother   . GER disease Mother   . Hypertension Mother   . Colon polyps Mother   . Hypertension Father   . Melanoma Father 72       lower back  . Skin cancer Father 66       lip  . Alzheimer's disease Maternal Grandmother   . Deep vein thrombosis Maternal Grandfather   . Lymphoma Maternal Grandfather 48  . Deep vein thrombosis Paternal Grandmother   . Deep vein thrombosis Paternal Grandfather   . Skin cancer Paternal Grandfather        lip; dx. in his 67s  . Colon polyps Maternal Uncle   . Lung cancer Cousin 71       smoker (maternal cousin)  . Colon cancer Neg Hx   . Esophageal cancer Neg Hx   . Rectal cancer Neg Hx   . Stomach cancer Neg Hx     Allergies  Allergen  Reactions  . Penicillins Rash    Reaction: 50 Years ago    Review of Systems  Constitutional: Negative for chills and fever.  Respiratory: Negative for cough, hemoptysis, sputum production and shortness of breath.   Cardiovascular: Negative for chest pain.  Gastrointestinal: Negative.      OBJECTIVE:    Vitals:   08/29/20 1000  BP: (!) 149/87  Pulse: 78  Temp: 98 F (36.7 C)  Weight: 201 lb (91.2 kg)  Height: 5\' 8"  (1.727 m)   Body mass index is 30.56 kg/m.  Physical Exam Constitutional:      General: He is not in acute distress.    Appearance: Normal appearance.  HENT:     Head: Normocephalic and atraumatic.  Pulmonary:     Effort: Pulmonary effort is normal. No respiratory distress.  Neurological:     General: No focal deficit present.     Mental Status: He is alert and oriented to person, place, and time.  Psychiatric:        Mood and Affect: Mood normal.        Behavior: Behavior normal.      Labs and Microbiology: CBC Latest Ref Rng & Units 08/01/2020 06/20/2020 05/18/2020  WBC 3.8 - 10.8 Thousand/uL 5.7 7.9 9.0  Hemoglobin 13.2 - 17.1 g/dL 14.3 14.6 13.6  Hematocrit 38.5 - 50.0 % 44.9 45.1 42.0  Platelets 140 - 400 Thousand/uL 217 295 306   CMP Latest Ref Rng & Units 08/01/2020 06/20/2020 05/18/2020  Glucose 65 - 99 mg/dL 92 91 97  BUN 7 - 25 mg/dL 12 11 14   Creatinine 0.70 - 1.25 mg/dL 0.91 0.96 1.00  Sodium 135 - 146 mmol/L 142 141 141  Potassium 3.5 - 5.3 mmol/L 4.5 4.7 4.5  Chloride 98 - 110 mmol/L 107 107 105  CO2 20 - 32 mmol/L 27 27 29   Calcium 8.6 - 10.3 mg/dL 9.4 9.3 9.2  Total Protein 6.1 - 8.1 g/dL 6.3 6.4 6.3  Total Bilirubin 0.2 - 1.2 mg/dL 0.3 0.3 0.3  Alkaline Phos 39 - 117 U/L - - -  AST 10 - 35 U/L 67(H) 28 19  ALT 9 - 46 U/L 69(H) 27 16  ASSESSMENT & PLAN:    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  There was near complete resolution of the index pleural-based left upper lobe 3.3 x 1.8 cm mass.  A cavitary lingular lesion was slightly progressive which I do not think is totally unexpected.  He has continued on voriconazole 300mg  BID now for about 16 weeks total and has been therapeutic at this dose.  His serum Aspergillus Ag has been undetectable since 06/20/20 and he subjectively feels well.  I will have him complete the 16 weeks of treatment on 08/31/20 and then have him stop antifungals since there is no ongoing immunosuppression with him.  Will get repeat CT to serve as new baseline with expectation that there may be some radiographic lag.  If all looks okay will see him back in 3 months and then plan for another CT in about 6 months to ensure stability.   Therapeutic drug monitoring His LFTs last time were slightly elevated on voriconazole therapy.  Will repeat again today to ensure stability and/or trending down.  Will also be checking serum Aspergillus Ag.    Orders Placed This Encounter  Procedures  . CT CHEST WO CONTRAST    Standing Status:   Future    Standing Expiration Date:   08/29/2021    Order Specific Question:   Preferred imaging location?    Answer:   [facility]  . CBC  . COMPLETE METABOLIC PANEL WITH GFR  . Aspergillus Antigen, Serum      Raynelle [facility] 08/29/2020, 10:23 AM

## 2020-08-29 NOTE — Assessment & Plan Note (Signed)
He was last seen by me on 08/01/20 and had a repeat CT scan on 07/05/20 that showed bilateral pulmonary nodularity primarily similar and slightly decreased.  There was near complete resolution of the index pleural-based left upper lobe 3.3 x 1.8 cm mass.  A cavitary lingular lesion was slightly progressive which I do not think is totally unexpected.  He has continued on voriconazole 300mg  BID now for about 16 weeks total and has been therapeutic at this dose.  His serum Aspergillus Ag has been undetectable since 06/20/20 and he subjectively feels well.  I will have him complete the 16 weeks of treatment on 08/31/20 and then have him stop antifungals since there is no ongoing immunosuppression with him.  Will get repeat CT to serve as new baseline with expectation that there may be some radiographic lag.  If all looks okay will see him back in 3 months and then plan for another CT in about 6 months to ensure stability.

## 2020-08-31 LAB — COMPLETE METABOLIC PANEL WITH GFR
AG Ratio: 1.7 (calc) (ref 1.0–2.5)
ALT: 29 U/L (ref 9–46)
AST: 34 U/L (ref 10–35)
Albumin: 4.1 g/dL (ref 3.6–5.1)
Alkaline phosphatase (APISO): 184 U/L — ABNORMAL HIGH (ref 35–144)
BUN: 11 mg/dL (ref 7–25)
CO2: 29 mmol/L (ref 20–32)
Calcium: 9.3 mg/dL (ref 8.6–10.3)
Chloride: 106 mmol/L (ref 98–110)
Creat: 0.92 mg/dL (ref 0.70–1.25)
GFR, Est African American: 99 mL/min/{1.73_m2} (ref >=60)
GFR, Est Non African American: 86 mL/min/{1.73_m2} (ref >=60)
Globulin: 2.4 g/dL (calc) (ref 1.9–3.7)
Glucose, Bld: 88 mg/dL (ref 65–99)
Potassium: 4.7 mmol/L (ref 3.5–5.3)
Sodium: 143 mmol/L (ref 135–146)
Total Bilirubin: 0.4 mg/dL (ref 0.2–1.2)
Total Protein: 6.5 g/dL (ref 6.1–8.1)

## 2020-08-31 LAB — CBC
HCT: 45.8 % (ref 38.5–50.0)
Hemoglobin: 15 g/dL (ref 13.2–17.1)
MCH: 26 pg — ABNORMAL LOW (ref 27.0–33.0)
MCHC: 32.8 g/dL (ref 32.0–36.0)
MCV: 79.4 fL — ABNORMAL LOW (ref 80.0–100.0)
MPV: 9.7 fL (ref 7.5–12.5)
Platelets: 219 10*3/uL (ref 140–400)
RBC: 5.77 10*6/uL (ref 4.20–5.80)
RDW: 14 % (ref 11.0–15.0)
WBC: 6.1 10*3/uL (ref 3.8–10.8)

## 2020-08-31 LAB — ASPERGILLUS ANTIGEN,SERUM
Aspergillus Ag, EIA: NOT DETECTED
Index Value: 0.17 (ref <0.50)

## 2020-09-01 ENCOUNTER — Telehealth: Payer: Self-pay

## 2020-09-01 NOTE — Telephone Encounter (Signed)
-----   Message from Mignon Pine, DO sent at 09/01/2020  9:20 AM EST ----- Please let pt know that labs looked stable.  LFT's have normalized and aspergillus antigen remains undetectable.  I will follow up after CT scan.  Thanks, Mitzi Hansen

## 2020-09-01 NOTE — Telephone Encounter (Signed)
I called patient to relay lab results.  Patient did not answer. A voicemail was left for patient to call back. Richard Grimes

## 2020-09-02 ENCOUNTER — Telehealth: Payer: Self-pay | Admitting: *Deleted

## 2020-09-02 NOTE — Telephone Encounter (Signed)
-----   Message from Mignon Pine, DO sent at 09/01/2020  9:20 AM EST ----- Please let pt know that labs looked stable.  LFT's have normalized and aspergillus antigen remains undetectable.  I will follow up after CT scan.  Thanks, Mitzi Hansen

## 2020-09-02 NOTE — Telephone Encounter (Signed)
Left message with lab results, reminded about CT appointment next week. Landis Gandy, RN

## 2020-09-05 ENCOUNTER — Ambulatory Visit (HOSPITAL_COMMUNITY)
Admission: RE | Admit: 2020-09-05 | Discharge: 2020-09-05 | Disposition: A | Discharge status: Home / Self Care | Payer: Medicare Other | Source: Ambulatory Visit | Attending: Internal Medicine | Admitting: Internal Medicine

## 2020-09-05 ENCOUNTER — Other Ambulatory Visit: Payer: Self-pay

## 2020-09-05 DIAGNOSIS — B441 Other pulmonary aspergillosis: Secondary | ICD-10-CM | POA: Insufficient documentation

## 2020-09-05 DIAGNOSIS — R0602 Shortness of breath: Secondary | ICD-10-CM | POA: Diagnosis not present

## 2020-09-08 ENCOUNTER — Telehealth: Payer: Self-pay

## 2020-09-08 NOTE — Telephone Encounter (Signed)
-----   Message from Mignon Pine, DO sent at 09/07/2020  4:43 PM EST ----- Please let patient know that repeat CT scan showed stable or diminished size of pulmonary nodules from his prior scan.  As discussed there can be some radiographic lag from clinical improvement and his aspergillus antigen is still negative.  Will continue to monitor him now that he has completed voriconazole therapy and see him back as scheduled.   Thanks, Mitzi Hansen

## 2020-09-08 NOTE — Telephone Encounter (Signed)
Patient called returning the phone call. Patient advised of CT results and verbalized understanding.  Patient had no questions or concerns. Richard Grimes [name]

## 2020-09-08 NOTE — Telephone Encounter (Signed)
Attempted to call patient to discuss CT results. Left VM for him to return call. Forwarding to triage for follow up.  Fedra Lanter Lorita Officer, RN

## 2020-10-17 ENCOUNTER — Other Ambulatory Visit: Payer: Self-pay | Admitting: Gastroenterology

## 2020-10-17 DIAGNOSIS — D508 Other iron deficiency anemias: Secondary | ICD-10-CM

## 2020-11-30 ENCOUNTER — Ambulatory Visit: Payer: Medicare Other | Admitting: Internal Medicine

## 2020-12-14 ENCOUNTER — Encounter: Payer: Self-pay | Admitting: Internal Medicine

## 2020-12-14 ENCOUNTER — Other Ambulatory Visit: Payer: Self-pay

## 2020-12-14 ENCOUNTER — Ambulatory Visit: Payer: Medicare Other | Admitting: Internal Medicine

## 2020-12-14 VITALS — BP 144/88 | HR 75 | Resp 16 | Ht 68.0 in | Wt 201.0 lb

## 2020-12-14 DIAGNOSIS — R918 Other nonspecific abnormal finding of lung field: Secondary | ICD-10-CM | POA: Diagnosis not present

## 2020-12-14 DIAGNOSIS — R7989 Other specified abnormal findings of blood chemistry: Secondary | ICD-10-CM | POA: Diagnosis not present

## 2020-12-14 DIAGNOSIS — Z8616 Personal history of COVID-19: Secondary | ICD-10-CM | POA: Diagnosis not present

## 2020-12-14 DIAGNOSIS — B441 Other pulmonary aspergillosis: Secondary | ICD-10-CM | POA: Diagnosis not present

## 2020-12-14 NOTE — Assessment & Plan Note (Signed)
Most recent CT in March noted improved pulmonary nodules consistent with improved infection in keeping with known diagnosis of aspergillus.  He is also following with pulmonary.

## 2020-12-14 NOTE — Patient Instructions (Signed)
Thank you for coming to see me today. It was a pleasure seeing you.  To Do: Labs today and will let you know the results when they come back Follow up in 6 months  If you have any questions or concerns, please do not hesitate to call the office at [phone].  Take Care,   Jule Ser, DO

## 2020-12-14 NOTE — Progress Notes (Signed)
[facility] for Infectious Disease  CHIEF COMPLAINT:    Follow up for aspergillus  SUBJECTIVE:    Richard Grimes is a 70 y.o. male with PMHx as below who presents to the clinic for aspergillus pneumonia follow up.   Richard Grimes is in for his routine follow-up visit this afternoon.  He was previously seen by me on 08/29/2020.  Since that visit he has completed his voriconazole therapy on 08/31/2020 after 16 weeks of treatment.  His serum Aspergillus antigen was not detected at that time and had been undetectable since 06/20/2020.  We obtained a repeat CT scan to serve as a new baseline which was done on 09/05/2020.  This imaging showed multiple pulmonary nodules and consolidation scattered throughout the lungs which were stable or diminished in size compared to his prior examinations which was felt to be consistent with improved infection.  It was decided to stop his antifungal therapy at that time since he had completed 16 weeks of treatment and clinically felt well as there was an expectation that he would have some radiographic lag.  He returns today for follow-up and reports feeling great.  He walked from parking lot to [place] yesterday to visit a family friend without any dyspnea..  Please see A&P for the details of today's visit and status of the patient's medical problems.   Patient's Medications  New Prescriptions   No medications on file  Previous Medications   ASPIRIN EC 81 MG TABLET    Take 81 mg by mouth daily. Swallow whole.   ECHINACEA EXTRACT PO    Take 1,520 mg by mouth at bedtime. 760 mg each   FLUTICASONE-UMECLIDIN-VILANT (TRELEGY ELLIPTA) 100-62.5-25 MCG/INH AEPB    INHALE 1 PUFF ONCE DAILY   L-ARGININE 500 MG CAPS    Take 1,000 mg by mouth at bedtime.   LEVOCARNITINE L-TARTRATE (L-CARNITINE) 500 MG CAPS    Take 500 mg by mouth at bedtime.   MULTIPLE MINERALS-VITAMINS (CAL-MAG-ZINC-D) TABS    Take 1,000 mg by mouth at bedtime.    MULTIPLE VITAMINS-MINERALS  (MULTIVITAMIN WITH MINERALS) TABLET    Take 1 tablet by mouth daily. Centrum Silver men 50+   NYSTATIN CREAM (MYCOSTATIN)    APPLY CREAM TOPICALLY TWICE DAILY   NYSTATIN POWDER    SMARTSIG:1 Application Topical 2-3 Times Daily   OMEPRAZOLE (PRILOSEC) 20 MG CAPSULE    TAKE 2 CAPSULES BY MOUTH ONCE DAILY   PROVENTIL HFA 108 (90 BASE) MCG/ACT INHALER    INHALE TWO PUFFS BY MOUTH EVERY 6 HOURS AS NEEDED FOR WHEEZING OR  SHORTNESS  OF  BREATH   TRIAMCINOLONE CREAM (KENALOG) 0.1 %    Apply 1 application topically 2 (two) times daily as needed.   TURMERIC CURCUMIN 500 MG CAPS    Take 500 mg by mouth at bedtime.  Modified Medications   No medications on file  Discontinued Medications   VORICONAZOLE (VFEND) 200 MG TABLET    Take 1.5 tablets (300 mg total) by mouth 2 (two) times daily.      Past Medical History:  Diagnosis Date   Allergic rhinitis due to pollen 07/18/2011   Allergy    Asthma    as a child   Bronchitis    history   COPD (chronic obstructive pulmonary disease) (HCC)    Severe, based on Spirometry.    Depression    Emphysema/COPD [facility])    Family history of colonic polyps    Family history of  lung cancer    Family history of lymphoma    Family history of melanoma    Family history of skin cancer    GERD (gastroesophageal reflux disease)    History of colonic polyps 11/17/2019   Hx of adenomatous colonic polyps    Hyperlipidemia    Iron deficiency anemia    Pneumonia    x 1    Social History   Tobacco Use   Smoking status: Former    Packs/day: 2.00    Years: 35.00    Pack years: 70.00    Types: Cigarettes    Quit date: 07/02/2004    Years since quitting: 16.4   Smokeless tobacco: Never  Vaping Use   Vaping Use: Never used  Substance Use Topics   Alcohol use: Yes    Alcohol/week: 0.0 standard drinks    Comment: occ glass on wine, or beer   Drug use: No    Family History  Problem Relation Age of Onset   Pulmonary fibrosis Mother    GER disease Mother     Hypertension Mother    Colon polyps Mother    Hypertension Father    Melanoma Father 34       lower back   Skin cancer Father 48       lip   Alzheimer's disease Maternal Grandmother    Deep vein thrombosis Maternal Grandfather    Lymphoma Maternal Grandfather 64   Deep vein thrombosis Paternal Grandmother    Deep vein thrombosis Paternal Grandfather    Skin cancer Paternal Grandfather        lip; dx. in his 7s   Colon polyps Maternal Uncle    Lung cancer Cousin 63       smoker (maternal cousin)   Colon cancer Neg Hx    Esophageal cancer Neg Hx    Rectal cancer Neg Hx    Stomach cancer Neg Hx     Allergies  Allergen Reactions   Penicillins Rash    Reaction: 50 Years ago    Review of Systems  Constitutional: Negative.   Respiratory: Negative.    Cardiovascular: Negative.   Gastrointestinal: Negative.     OBJECTIVE:    Vitals:   12/14/20 1455  BP: (!) 144/88  Pulse: 75  Resp: 16  SpO2: 96%  Weight: 201 lb (91.2 kg)  Height: 5\' 8"  (1.727 m)   Body mass index is 30.56 kg/m.  Physical Exam Constitutional:      General: He is not in acute distress.    Appearance: Normal appearance.  HENT:     Head: Normocephalic and atraumatic.  Eyes:     Extraocular Movements: Extraocular movements intact.     Conjunctiva/sclera: Conjunctivae normal.  Pulmonary:     Effort: Pulmonary effort is normal. No respiratory distress.  Skin:    General: Skin is warm and dry.     Findings: No rash.  Neurological:     General: No focal deficit present.     Mental Status: He is alert and oriented to person, place, and time.  Psychiatric:        Mood and Affect: Mood normal.        Behavior: Behavior normal.     Labs and Microbiology: CBC Latest Ref Rng & Units 08/29/2020 08/01/2020 06/20/2020  WBC 3.8 - 10.8 Thousand/uL 6.1 5.7 7.9  Hemoglobin 13.2 - 17.1 g/dL 15.0 14.3 14.6  Hematocrit 38.5 - 50.0 % 45.8 44.9 45.1  Platelets 140 - 400 Thousand/uL 219 217 295  CMP Latest  Ref Rng & Units 08/29/2020 08/01/2020 06/20/2020  Glucose 65 - 99 mg/dL 88 92 91  BUN 7 - 25 mg/dL 11 12 11   Creatinine 0.70 - 1.25 mg/dL 0.92 0.91 0.96  Sodium 135 - 146 mmol/L 143 142 141  Potassium 3.5 - 5.3 mmol/L 4.7 4.5 4.7  Chloride 98 - 110 mmol/L 106 107 107  CO2 20 - 32 mmol/L 29 27 27   Calcium 8.6 - 10.3 mg/dL 9.3 9.4 9.3  Total Protein 6.1 - 8.1 g/dL 6.5 6.3 6.4  Total Bilirubin 0.2 - 1.2 mg/dL 0.4 0.3 0.3  Alkaline Phos 39 - 117 U/L - - -  AST 10 - 35 U/L 34 67(H) 28  ALT 9 - 46 U/L 29 69(H) 27     No results found for this or any previous visit (from the past 240 hour(s)).  Imaging:   IMPRESSION: 1. Multiple pulmonary nodules and consolidations scattered throughout the lungs are stable or slightly diminished in size compared to prior examination. 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 2. Emphysema. 3. Coronary artery disease.   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  Will repeat serum Aspergillus antigen today.  If looks okay, will see him back in 6 months and anticipate repeating CT scan around that time.  Elevated LFTs LFTs had been elevated slightly while on voriconazole but these normalized with recent CMP 08/29/20.  Multiple lung nodules Most recent CT in March noted improved pulmonary nodules consistent with improved infection in keeping with known diagnosis of aspergillus.  He is also following with pulmonary.  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  Recommended vaccination today and patient declines.     Orders Placed This Encounter  Procedures   Aspergillus Antigen, Serum       Raynelle [facility] for Infectious Disease [facility] 12/14/2020, 2:57 PM   I spent 40 minutes dedicated to the care of this patient on the date of this encounter to include pre-visit review of records, face-to-face time with the patient discussing aspergillus, nodules, COVID, and post-visit ordering of testing.

## 2020-12-14 NOTE — Assessment & Plan Note (Signed)
LFTs had been elevated slightly while on voriconazole but these normalized with recent CMP 08/29/20.

## 2020-12-14 NOTE — Assessment & Plan Note (Signed)
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  Recommended vaccination today and patient declines.

## 2020-12-14 NOTE — Assessment & Plan Note (Signed)
He completed 16 weeks of voriconazole therapy in early March 2022 and continues to feel well with out any worsening cough, fevers, hemoptysis, or worsening SOB.  Will repeat serum Aspergillus antigen today.  If looks okay, will see him back in 6 months and anticipate repeating CT scan around that time.

## 2020-12-19 LAB — ASPERGILLUS ANTIGEN,SERUM
Aspergillus Ag, EIA: NOT DETECTED
Index Value: 0.09 (ref <0.50)

## 2020-12-26 ENCOUNTER — Telehealth: Payer: Self-pay

## 2020-12-26 NOTE — Telephone Encounter (Signed)
Called patient to relay results, no answer. Left HIPAA compliant voicemail requesting callback.   Laurabelle Gorczyca D Glenys Snader, RN  

## 2020-12-26 NOTE — Telephone Encounter (Signed)
-----   Message from Mignon Pine, DO sent at 12/26/2020 11:09 AM EDT ----- Can you please let patient know that his aspergillus antigen blood test remains negative.  As long as he continues to feel well please follow up with me as scheduled.

## 2021-01-12 ENCOUNTER — Other Ambulatory Visit: Payer: Self-pay | Admitting: Gastroenterology

## 2021-01-12 DIAGNOSIS — D508 Other iron deficiency anemias: Secondary | ICD-10-CM

## 2021-02-26 ENCOUNTER — Encounter: Payer: Self-pay | Admitting: Gastroenterology

## 2021-03-06 MED ORDER — TRIAMCINOLONE ACETONIDE 0.1 % EX CREA: 1.0000 "application " | TOPICAL_CREAM | Freq: Two times a day (BID) | CUTANEOUS | 0 refills | Status: DC | PRN

## 2021-03-06 NOTE — Telephone Encounter (Signed)
Last office visit 04/07/2020 for cough.  Last refilled 01/11/2020 for 454 g with no refills.  No future appointments with PCP.

## 2021-04-15 ENCOUNTER — Other Ambulatory Visit: Payer: Self-pay | Admitting: Gastroenterology

## 2021-04-15 DIAGNOSIS — D508 Other iron deficiency anemias: Secondary | ICD-10-CM

## 2021-05-11 IMAGING — CT CT CHEST W/O CM
2 of 4 series · 15 of 36 positions shown, 18 images · non-contrast
Comparison: 07/04/2020

CLINICAL DATA: Follow-up pulmonary aspergillosis, shortness of
breath

EXAM:
CT CHEST WITHOUT CONTRAST
TECHNIQUE: Multidetector CT imaging of the chest was performed following the
standard protocol without IV contrast.

[Series 2: thorax · axial · 0.79mm/px · z∈[+43,+309]mm · 12 of 159 slices shown, 15 images]
[im 13/159  mediastinal]
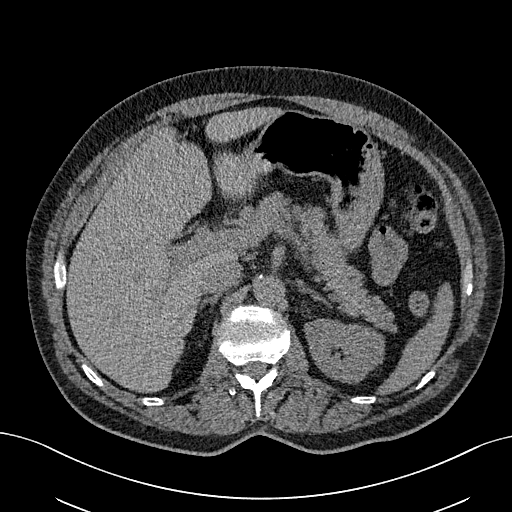
[im 13/159  lung]
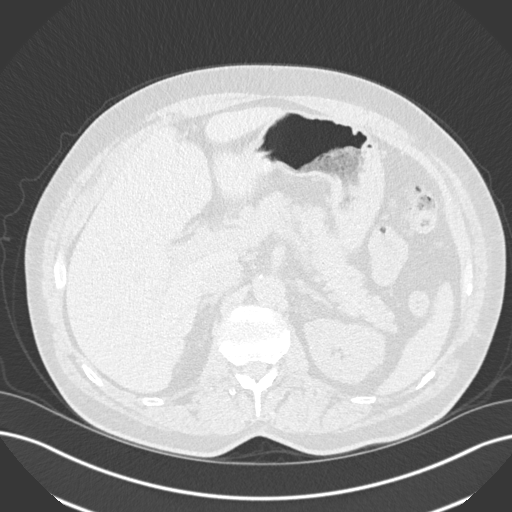
[im 25/159  lung]
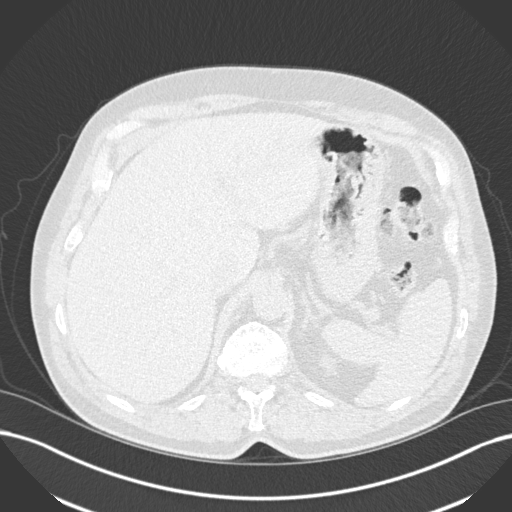
[im 37/159  lung]
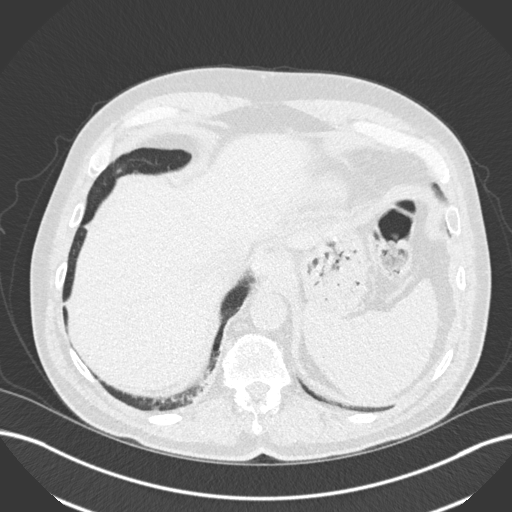
[im 49/159  lung]
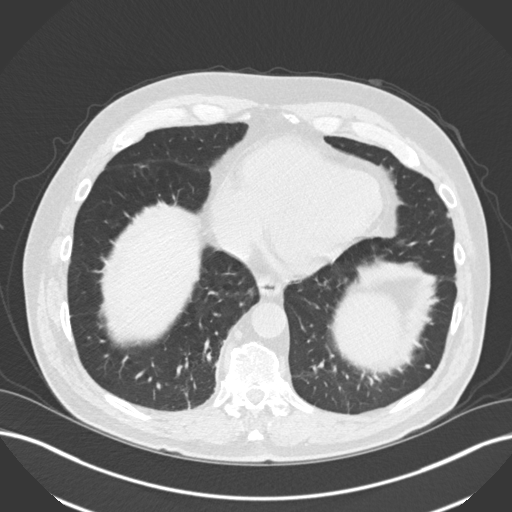
[im 61/159  mediastinal]
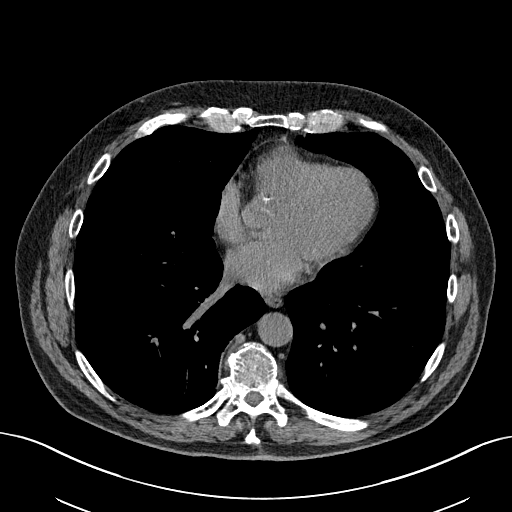
[im 61/159  lung]
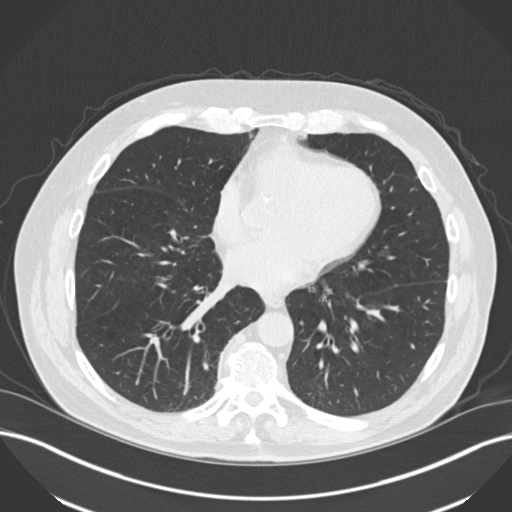
[im 73/159  lung]
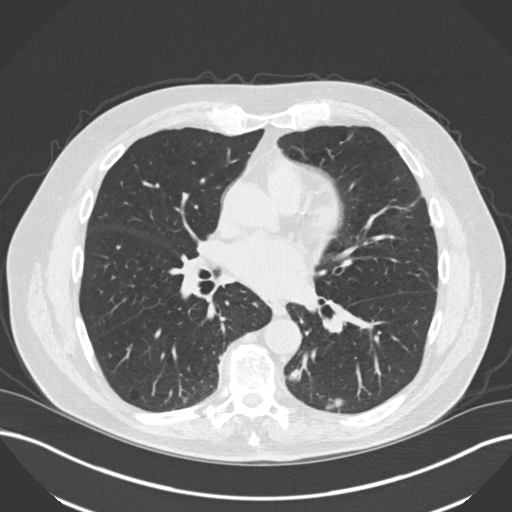
[im 86/159  lung]
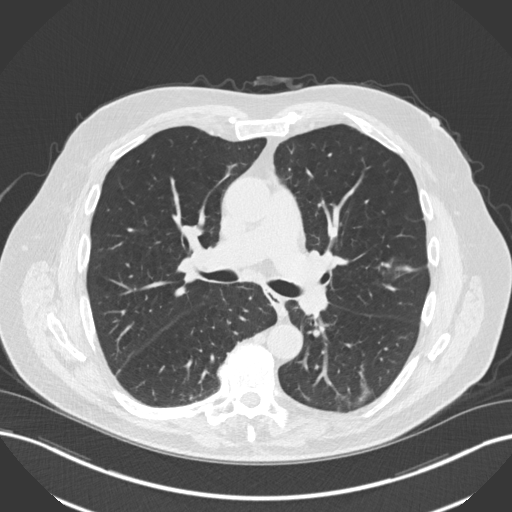
[im 98/159  lung]
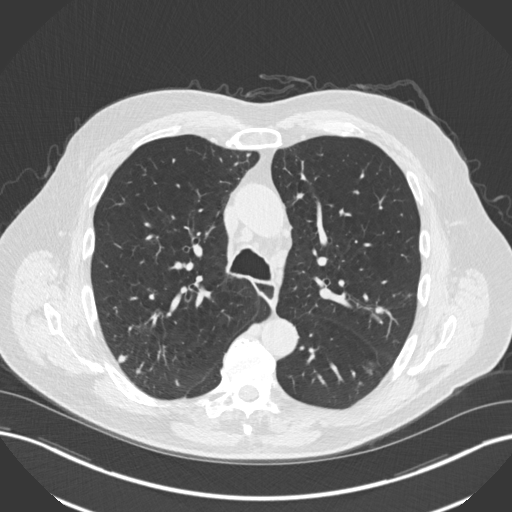
[im 110/159  mediastinal]
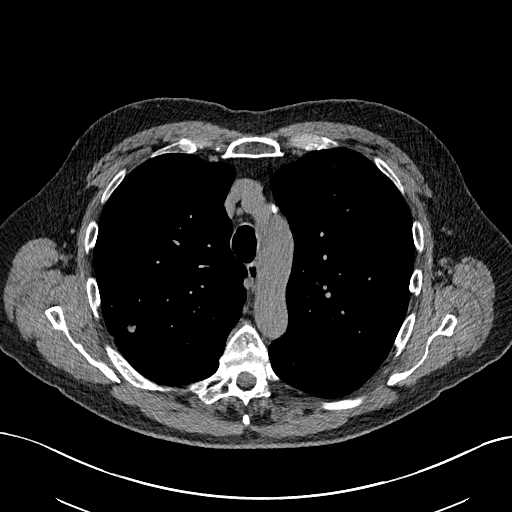
[im 110/159  lung]
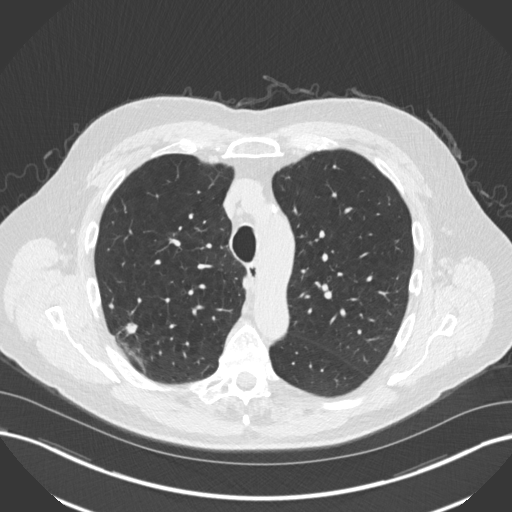
[im 122/159  lung]
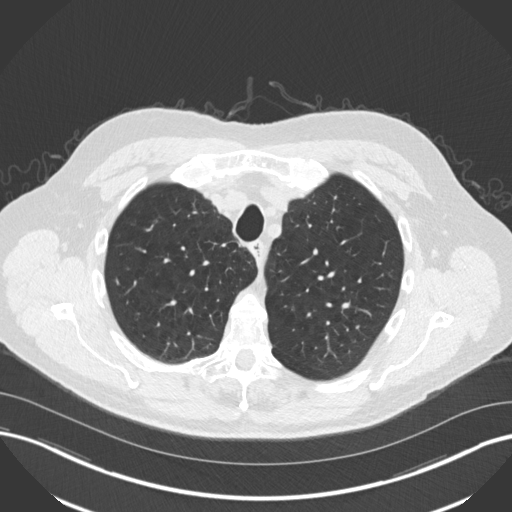
[im 134/159  lung]
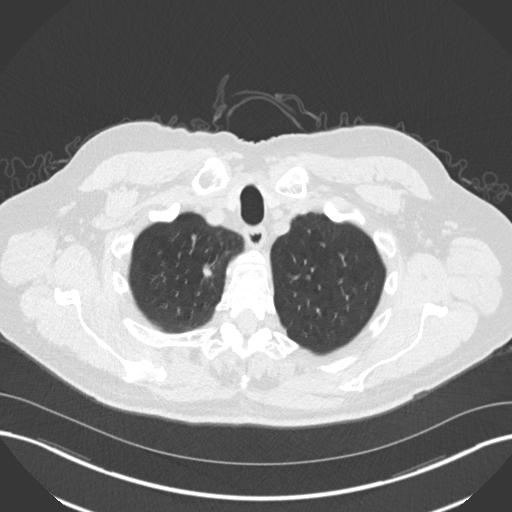
[im 146/159  lung]
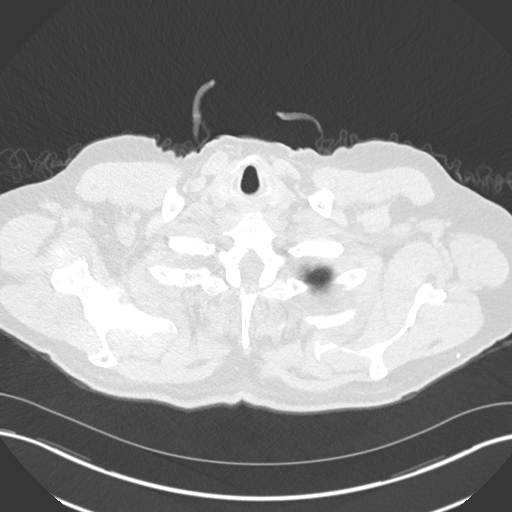

[Series 6: coronal · coronal · 0.64mm/px · 3 of 158 slices shown]
[im 32/158  lung]
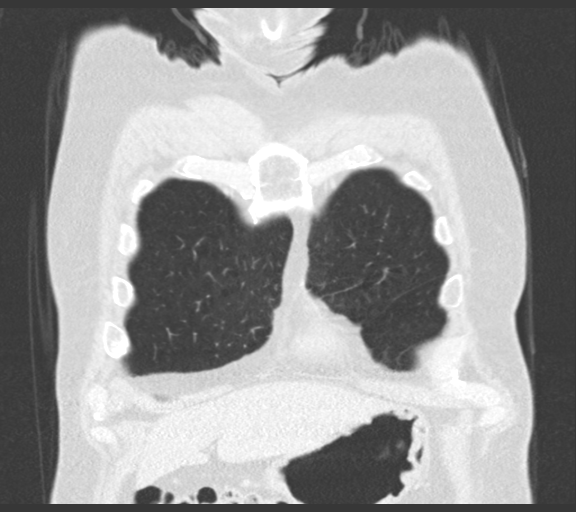
[im 63/158  lung]
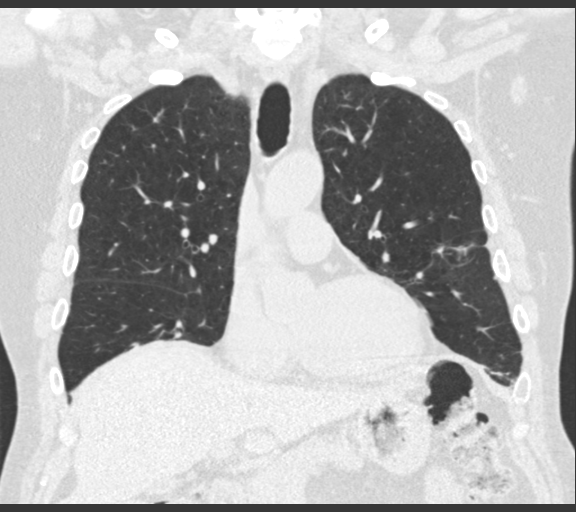
[im 95/158  lung]
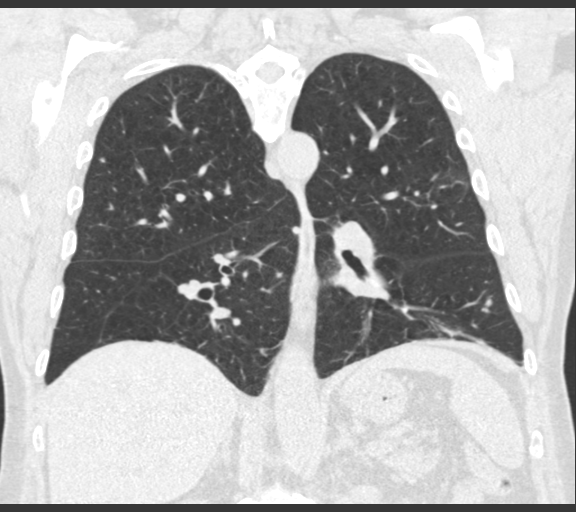

[15 of 36 positions shown; findings below may reference images not displayed]

FINDINGS: Cardiovascular: Aortic atherosclerosis. Normal heart size. Scattered
left coronary artery calcifications. No pericardial effusion.

Mediastinum/Nodes: No enlarged mediastinal, hilar, or axillary lymph
nodes. Thyroid gland, trachea, and esophagus demonstrate no
significant findings.

Lungs/Pleura: Moderate centrilobular emphysema. Multiple pulmonary
nodules consolidations scattered throughout the lungs are stable or
slightly diminished in size compared to prior examination, an index
nodule of the dependent left lower lobe measuring 1.1 x 1.0 cm,
previously 1.3 x 1.0 cm (series 5, image 85). An additional index
nodule of the peripheral right upper lobe is unchanged, measuring
1.2 x 1.0 cm (series 5, image 48). There is mild scarring of the
bilateral lung bases. No pleural effusion or pneumothorax.

Upper Abdomen: No acute abnormality.

Musculoskeletal: No chest wall mass or suspicious bone lesions
identified.
IMPRESSION: 1. Multiple pulmonary nodules and consolidations scattered
throughout the lungs are stable or slightly diminished in size
compared to prior examination. Findings are consistent with slightly
improved infection, and in keeping with reported diagnosis of
aspergillosis.
2. Emphysema.
3. Coronary artery disease.

Aortic Atherosclerosis (65BZX-8AE.E) and Emphysema (65BZX-2TU.4).

## 2021-05-30 ENCOUNTER — Other Ambulatory Visit: Payer: Self-pay | Admitting: Pulmonary Disease

## 2021-06-21 ENCOUNTER — Ambulatory Visit: Payer: Medicare Other | Admitting: Internal Medicine

## 2021-06-28 ENCOUNTER — Other Ambulatory Visit: Payer: Self-pay | Admitting: Pulmonary Disease

## 2021-07-01 ENCOUNTER — Encounter: Payer: Self-pay | Admitting: Family Medicine

## 2021-07-04 ENCOUNTER — Ambulatory Visit: Payer: Medicare Other | Admitting: Internal Medicine

## 2021-07-04 MED ORDER — TRELEGY ELLIPTA 100-62.5-25 MCG/ACT IN AEPB: INHALATION_SPRAY | RESPIRATORY_TRACT | 3 refills | Status: DC

## 2021-07-04 NOTE — Telephone Encounter (Signed)
Previously filled by Dr. Vaughan Browner with note Needs OV for any future refills.  Ok to refill?

## 2021-07-14 ENCOUNTER — Encounter: Payer: Self-pay | Admitting: Family Medicine

## 2021-07-14 ENCOUNTER — Other Ambulatory Visit: Payer: Self-pay | Admitting: Gastroenterology

## 2021-07-14 DIAGNOSIS — D508 Other iron deficiency anemias: Secondary | ICD-10-CM

## 2021-07-14 MED ORDER — OMEPRAZOLE 20 MG PO CPDR: 40.0000 mg | DELAYED_RELEASE_CAPSULE | Freq: Every day | ORAL | 0 refills | Status: DC

## 2021-07-14 NOTE — Telephone Encounter (Signed)
Refill on Omeprazole sent to [facility] as requested.

## 2021-07-19 ENCOUNTER — Encounter: Payer: Self-pay | Admitting: Internal Medicine

## 2021-07-19 ENCOUNTER — Ambulatory Visit: Payer: Medicare Other | Admitting: Internal Medicine

## 2021-07-19 ENCOUNTER — Other Ambulatory Visit: Payer: Self-pay

## 2021-07-19 VITALS — BP 148/93 | HR 96 | Temp 98.1°F | Wt 196.0 lb

## 2021-07-19 DIAGNOSIS — Z8616 Personal history of COVID-19: Secondary | ICD-10-CM

## 2021-07-19 DIAGNOSIS — B441 Other pulmonary aspergillosis: Secondary | ICD-10-CM | POA: Diagnosis not present

## 2021-07-19 DIAGNOSIS — R918 Other nonspecific abnormal finding of lung field: Secondary | ICD-10-CM | POA: Diagnosis not present

## 2021-07-19 DIAGNOSIS — R911 Solitary pulmonary nodule: Secondary | ICD-10-CM | POA: Diagnosis not present

## 2021-07-19 NOTE — Progress Notes (Signed)
[facility] for Infectious Disease  CHIEF COMPLAINT:    Follow up for pulmonary aspergillus  SUBJECTIVE:    Richard Grimes is a 71 y.o. male with PMHx as below who presents to the clinic for pulmonary aspergillus.   Patient is here today for routine follow up.  He was last seen in June 2022.  At that time, he was doing well from a pulmonary standpoint.  He completed 16 weeks of antifungal therapy with voriconazole in March 2022 and his serum Aspergillus antigen has been undetectable since December 2021.  He has continued to do well from respiratory standpoint with no worsening symptoms of cough, hemoptysis, sputum production, or fevers. He continues to work and anticipates a busy spring season.  Please see A&P for the details of today's visit and status of the patient's medical problems.   Patient's Medications  New Prescriptions   No medications on file  Previous Medications   ASPIRIN EC 81 MG TABLET    Take 81 mg by mouth daily. Swallow whole.   ECHINACEA EXTRACT PO    Take 1,520 mg by mouth at bedtime. 760 mg each   FLUTICASONE-UMECLIDIN-VILANT (TRELEGY ELLIPTA) 100-62.5-25 MCG/ACT AEPB    INHALE 1 PUFF ONCE DAILY   L-ARGININE 500 MG CAPS    Take 1,000 mg by mouth at bedtime.   LEVOCARNITINE L-TARTRATE (L-CARNITINE) 500 MG CAPS    Take 500 mg by mouth at bedtime.   MULTIPLE MINERALS-VITAMINS (CAL-MAG-ZINC-D) TABS    Take 1,000 mg by mouth at bedtime.    MULTIPLE VITAMINS-MINERALS (MULTIVITAMIN WITH MINERALS) TABLET    Take 1 tablet by mouth daily. Centrum Silver men 50+   OMEPRAZOLE (PRILOSEC) 20 MG CAPSULE    Take 2 capsules (40 mg total) by mouth daily.   PROVENTIL HFA 108 (90 BASE) MCG/ACT INHALER    INHALE TWO PUFFS BY MOUTH EVERY 6 HOURS AS NEEDED FOR WHEEZING OR  SHORTNESS  OF  BREATH   TRIAMCINOLONE CREAM (KENALOG) 0.1 %    Apply 1 application topically 2 (two) times daily as needed.   TURMERIC CURCUMIN 500 MG CAPS    Take 500 mg by mouth at bedtime.  Modified  Medications   No medications on file  Discontinued Medications   NYSTATIN CREAM (MYCOSTATIN)    APPLY CREAM TOPICALLY TWICE DAILY   NYSTATIN POWDER    SMARTSIG:1 Application Topical 2-3 Times Daily      Past Medical History:  Diagnosis Date   Allergic rhinitis due to pollen 07/18/2011   Allergy    Asthma    as a child   Bronchitis    history   COPD (chronic obstructive pulmonary disease) (HCC)    Severe, based on Spirometry.    Depression    Emphysema/COPD [facility])    Family history of colonic polyps    Family history of lung cancer    Family history of lymphoma    Family history of melanoma    Family history of skin cancer    GERD (gastroesophageal reflux disease)    History of colonic polyps 11/17/2019   Hx of adenomatous colonic polyps    Hyperlipidemia    Iron deficiency anemia    Pneumonia    x 1    Social History   Tobacco Use   Smoking status: Former    Packs/day: 2.00    Years: 35.00    Pack years: 70.00    Types: Cigarettes    Quit date: 07/02/2004  Years since quitting: 17.0   Smokeless tobacco: Never  Vaping Use   Vaping Use: Never used  Substance Use Topics   Alcohol use: Yes    Alcohol/week: 0.0 standard drinks    Comment: occ glass on wine, or beer   Drug use: No    Family History  Problem Relation Age of Onset   Pulmonary fibrosis Mother    GER disease Mother    Hypertension Mother    Colon polyps Mother    Hypertension Father    Melanoma Father 24       lower back   Skin cancer Father 65       lip   Alzheimer's disease Maternal Grandmother    Deep vein thrombosis Maternal Grandfather    Lymphoma Maternal Grandfather 64   Deep vein thrombosis Paternal Grandmother    Deep vein thrombosis Paternal Grandfather    Skin cancer Paternal Grandfather        lip; dx. in his 42s   Colon polyps Maternal Uncle    Lung cancer Cousin 77       smoker (maternal cousin)   Colon cancer Neg Hx    Esophageal cancer Neg Hx    Rectal cancer Neg Hx     Stomach cancer Neg Hx     Allergies  Allergen Reactions   Penicillins Rash    Reaction: 50 Years ago    Review of Systems  Constitutional: Negative.   Respiratory: Negative.    Cardiovascular: Negative.     OBJECTIVE:    Vitals:   07/19/21 1355  BP: (!) 148/93  Pulse: 96  Temp: 98.1 F (36.7 C)  TempSrc: Oral  SpO2: 96%  Weight: 196 lb (88.9 kg)   Body mass index is 29.8 kg/m.  Physical Exam Constitutional:      General: He is not in acute distress.    Appearance: Normal appearance.  HENT:     Head: Normocephalic and atraumatic.  Pulmonary:     Effort: Pulmonary effort is normal. No respiratory distress.  Abdominal:     General: There is no distension.     Palpations: Abdomen is soft.  Skin:    General: Skin is warm and dry.     Findings: No rash.  Neurological:     General: No focal deficit present.     Mental Status: He is alert and oriented to person, place, and time.  Psychiatric:        Mood and Affect: Mood normal.        Behavior: Behavior normal.     Labs and Microbiology: CBC Latest Ref Rng & Units 08/29/2020 08/01/2020 06/20/2020  WBC 3.8 - 10.8 Thousand/uL 6.1 5.7 7.9  Hemoglobin 13.2 - 17.1 g/dL 15.0 14.3 14.6  Hematocrit 38.5 - 50.0 % 45.8 44.9 45.1  Platelets 140 - 400 Thousand/uL 219 217 295   CMP Latest Ref Rng & Units 08/29/2020 08/01/2020 06/20/2020  Glucose 65 - 99 mg/dL 88 92 91  BUN 7 - 25 mg/dL 11 12 11   Creatinine 0.70 - 1.25 mg/dL 0.92 0.91 0.96  Sodium 135 - 146 mmol/L 143 142 141  Potassium 3.5 - 5.3 mmol/L 4.7 4.5 4.7  Chloride 98 - 110 mmol/L 106 107 107  CO2 20 - 32 mmol/L 29 27 27   Calcium 8.6 - 10.3 mg/dL 9.3 9.4 9.3  Total Protein 6.1 - 8.1 g/dL 6.5 6.3 6.4  Total Bilirubin 0.2 - 1.2 mg/dL 0.4 0.3 0.3  Alkaline Phos 39 - 117 U/L - - -  AST 10 - 35 U/L 34 67(H) 28  ALT 9 - 46 U/L 29 69(H) 27       ASSESSMENT & PLAN:    Pulmonary aspergillosis (HCC) He is status post 16 weeks of voriconazole therapy in March  2022 after developing pulmonary aspergillus following COVID infection. He continues to feel well without worsening pulmonary symptoms.  Will repeat CT chest without contrast for follow up from previous imaging in March 2022.  Follow up as needed based on imaging results.  Multiple lung nodules Most recent CT scan in March 2022 noted improved pulmonary nodules consistent with improved Aspergillus infection.  Will repeat CT as noted above.    Orders Placed This Encounter  Procedures   CT CHEST WO CONTRAST    Standing Status:   Future    Standing Expiration Date:   07/19/2022    Order Specific Question:   Preferred imaging location?    Answer:   [place] [facility] 07/19/2021, 2:23 PM

## 2021-07-19 NOTE — Patient Instructions (Signed)
Thank you for coming to see me today. It was a pleasure seeing you.  To Do: CT scan of chest for follow up Based on results will let you know if further appointments are needed.  If you have any questions or concerns, please do not hesitate to call the office at [phone].  Take Care,   Jule Ser

## 2021-07-19 NOTE — Assessment & Plan Note (Signed)
He is status post 16 weeks of voriconazole therapy in March 2022 after developing pulmonary aspergillus following COVID infection. He continues to feel well without worsening pulmonary symptoms.  Will repeat CT chest without contrast for follow up from previous imaging in March 2022.  Follow up as needed based on imaging results.

## 2021-07-19 NOTE — Assessment & Plan Note (Signed)
Most recent CT scan in March 2022 noted improved pulmonary nodules consistent with improved Aspergillus infection.  Will repeat CT as noted above.

## 2021-08-07 ENCOUNTER — Ambulatory Visit
Admission: RE | Admit: 2021-08-07 | Discharge: 2021-08-07 | Disposition: A | Discharge status: Home / Self Care | Payer: Medicare Other | Source: Ambulatory Visit | Attending: Internal Medicine | Admitting: Internal Medicine

## 2021-08-07 ENCOUNTER — Other Ambulatory Visit: Payer: Self-pay

## 2021-08-07 DIAGNOSIS — R911 Solitary pulmonary nodule: Secondary | ICD-10-CM | POA: Diagnosis not present

## 2021-08-07 DIAGNOSIS — I700 Atherosclerosis of aorta: Secondary | ICD-10-CM | POA: Diagnosis not present

## 2021-08-07 DIAGNOSIS — J439 Emphysema, unspecified: Secondary | ICD-10-CM | POA: Diagnosis not present

## 2021-08-07 DIAGNOSIS — R918 Other nonspecific abnormal finding of lung field: Secondary | ICD-10-CM | POA: Diagnosis not present

## 2021-08-08 ENCOUNTER — Encounter: Payer: Self-pay | Admitting: Family Medicine

## 2021-08-08 ENCOUNTER — Encounter: Payer: Self-pay | Admitting: Internal Medicine

## 2021-08-09 NOTE — Telephone Encounter (Signed)
Dr. Vaughan Browner,   I wanted to include you with this discussion with Antony Haste.  I am sure infectious disease will contact him about his CT.  I told him that he also needs to continue seeing you.  The pulmonology office originally started him on Trelegy.  I will leave it to you if you want to handle that.  I am not sure if one of the older medicines like Advair or Symbicort would be equivalent.  Thank you.

## 2021-08-14 NOTE — Telephone Encounter (Signed)
Thanks for the update.  I reviewed the CT scan which looks improved. We will make a follow-up appointment at the next available to review scan and discuss inhaler question.

## 2021-08-21 ENCOUNTER — Encounter: Payer: Self-pay | Admitting: Gastroenterology

## 2021-08-25 ENCOUNTER — Ambulatory Visit: Payer: Medicare Other | Admitting: Pulmonary Disease

## 2021-08-25 ENCOUNTER — Encounter: Payer: Self-pay | Admitting: Pulmonary Disease

## 2021-08-25 ENCOUNTER — Other Ambulatory Visit: Payer: Self-pay

## 2021-08-25 VITALS — BP 134/80 | HR 91 | Ht 67.0 in | Wt 196.4 lb

## 2021-08-25 DIAGNOSIS — B449 Aspergillosis, unspecified: Secondary | ICD-10-CM

## 2021-08-25 DIAGNOSIS — R918 Other nonspecific abnormal finding of lung field: Secondary | ICD-10-CM

## 2021-08-25 DIAGNOSIS — J449 Chronic obstructive pulmonary disease, unspecified: Secondary | ICD-10-CM

## 2021-08-25 MED ORDER — BREZTRI AEROSPHERE 160-9-4.8 MCG/ACT IN AERO: 2.0000 | INHALATION_SPRAY | Freq: Two times a day (BID) | RESPIRATORY_TRACT | 5 refills | Status: DC

## 2021-08-25 NOTE — Progress Notes (Signed)
Richard Grimes    [id number]    11-May-1951  Primary Care Physician:Copland, Frederico Hamman, MD  Referring Physician: [name], MD [place],  [place] [address]  Problem list: Follow-up for  Lung nodule, COPD Post Covid invasive aspergillosis, diagnosed with a bronchoscopy, s/p voriconazle treatment in 1972  HPI: 71 year old with history of asthma as a child, COPD.  Diagnosed with COPD in 2010 with mild symptoms of dyspnea on exertion.  He has productive cough with white mucus, denies any fevers, chills.  He was currently maintained on Dulera and Spiriva with about 1 exacerbation every year.  Switch to Trelegy in 2019 with improvement in symptoms He has seen Dr. Einar Gip, cardiologist for CT findings of coronary atherosclerosis.  Had COVID-19 infection in September 2021 treated with monoclonal antibody and 3 weeks of steroids Developed lung infiltrates with cavitation post Covid.  Underwent bronchoscopy on 05/03/2020 by Dr. Valeta Harms with findings of invasive aspergillosis.  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  Previous exposure to chemicals including fumes, polymers.  Denies any asbestos, Architect exposure.  No mold exposure, no hot tubs. Smoking history: 70-pack-year smoking history.  Quit in 2004. Travel History: No recent travel.  Interim history: Underwent 16 weeks of antifungal therapy with voriconazole was finished in March 2022.  His follow-up serum Aspergillus antigen has been undetectable and CT is stable.  He has followed up with ID  Currently on Trelegy but this has been changed to breztri due to insurance coverage. States that breathing is stable with no issues  Outpatient Encounter Medications as of 08/25/2021  Medication Sig   Budeson-Glycopyrrol-Formoterol (BREZTRI AEROSPHERE) 160-9-4.8 MCG/ACT AERO Inhale 2 puffs into the lungs 2 (two) times  daily.   aspirin EC 81 MG tablet Take 81 mg by mouth daily. Swallow whole.   ECHINACEA EXTRACT PO Take 1,520 mg by mouth at bedtime. 760 mg each   L-Arginine 500 MG CAPS Take 1,000 mg by mouth at bedtime.   levOCARNitine L-Tartrate (L-CARNITINE) 500 MG CAPS Take 500 mg by mouth at bedtime.   Multiple Minerals-Vitamins (CAL-MAG-ZINC-D) TABS Take 1,000 mg by mouth at bedtime.    Multiple Vitamins-Minerals (MULTIVITAMIN WITH MINERALS) tablet Take 1 tablet by mouth daily. Centrum Silver men 50+   omeprazole (PRILOSEC) 20 MG capsule Take 2 capsules (40 mg total) by mouth daily.   PROVENTIL HFA 108 (90 Base) MCG/ACT inhaler INHALE TWO PUFFS BY MOUTH EVERY 6 HOURS AS NEEDED FOR WHEEZING OR  SHORTNESS  OF  BREATH (Patient taking differently: Inhale 2 puffs into the lungs every 6 (six) hours as needed for wheezing or shortness of breath.)   triamcinolone cream (KENALOG) 0.1 % Apply 1 application topically 2 (two) times daily as needed.   Turmeric Curcumin 500 MG CAPS Take 500 mg by mouth at bedtime.   [DISCONTINUED] Fluticasone-Umeclidin-Vilant (TRELEGY ELLIPTA) 100-62.5-25 MCG/ACT AEPB INHALE 1 PUFF ONCE DAILY   No facility-administered encounter medications on file as of 08/25/2021.   Physical Exam: Blood pressure 134/80, pulse 91, height _0  (1.702 m), weight 196 lb 6.4 oz (89.1 kg), SpO2 95 %. Gen:      No acute distress HEENT:  EOMI, sclera anicteric Neck:     No masses; no thyromegaly Lungs:    Clear to auscultation bilaterally; normal respiratory effort CV:         Regular rate and rhythm; no murmurs Abd:      +  bowel sounds; soft, non-tender; no palpable masses, no distension Ext:    No edema; adequate peripheral perfusion Skin:      Warm and dry; no rash Neuro: alert and oriented x 3 Psych: normal mood and affect   Data Reviewed: Imaging High-resolution CT 09/04/17-nonspecific basal reticulation.  Aortic, coronary atherosclerosis.  Mild to moderate emphysema.  7 mm right lower lobe  nodule. CT chest 04/22/2018-left upper lobe nodule is enlarged to 8 mm, several new pulmonary nodules in both lungs Moderate emphysema, two-vessel coronary atherosclerosis. PET scan 05/01/2018- FDG uptake in the lung nodules.  Faint stranding in the mesentery. CT chest 08/18/2018- decrease in size and number of previously noted pulmonary nodule.  The left upper lobe nodule has decreased to 6 mm. CT chest 08/21/2019-new 5 mm left upper lobe nodule.  Other subcentimeter pulmonary nodules remain stable CT chest 04/21/2020-bilateral nodular opacities with cavitation in the left upper lobe, multiple subcentimeter mediastinal lymph nodes. PET scan 04/26/2020-multifocal FDG avid pulmonary nodules.  No evidence of uptake elsewhere CT chest 08/07/2021-mild to moderate emphysema, stable/or resolved bilateral lung nodules.  New 6 mm right upper lobe focal scar. I have reviewed the images personally.  PFTs 09/23/17 FVC 3.74 [89%], FEV1 1.97 [63%], F/F 53, TLC 108%, DLCO 55% Moderate obstruction and diffusion defect  Biodesix Nodify XL 2 April 28, 2018 Pretest probability 16% risk of malignancy Test result- reduced risk of malignancy.  3% risk of malignancy  Labs: Bronchoscopy 05/03/2020 Fungal cultures Aspergillus terrens BAL Aspergillus antigen positive Hyphal elements seen on pathology.  Assessment:  Post Covid invasive aspergillosis Developed invasive aspergillosis after COVID-19 infection.  This has been reported in the literature and probably precipitated by steroid use.  He has finished voriconazole therapy and is followed by ID  Moderate COPD Currently on trelegy.  He is due to change to breztri given insurance coverage issues Advised to work on diet, weight loss, exercise We discussed pulmonary rehab however he wants to try an exercise program at home by himself first.  He will call us in case he wants a referral to a rehab program at [facility].  Pulmonary nodule Has a new vague 6 mm  nodule in the right upper lobe.  Order follow-up CT in 6 months  Plan/Recommendations: - Change Trelegy to breztri - CT scan without contrast in 6 months.  Marshell Garfinkel MD [facility] 08/25/2021, 9:52 AM  CC: [name], MD

## 2021-08-25 NOTE — Patient Instructions (Signed)
I am glad you are doing well with regard to your breathing I have reviewed your CT scan which looks good I believe We will get a follow-up CT in 6 months to make sure there is no changes We will send in a prescription for breztri to [facility].  Let us know if this inhaler works for you Return to clinic in 6 months after CT scan

## 2021-09-10 ENCOUNTER — Encounter: Payer: Self-pay | Admitting: Family Medicine

## 2021-09-10 ENCOUNTER — Encounter: Payer: Self-pay | Admitting: Cardiovascular Disease

## 2021-09-13 ENCOUNTER — Other Ambulatory Visit: Payer: Medicare Other

## 2021-09-13 ENCOUNTER — Telehealth: Payer: Self-pay

## 2021-09-13 NOTE — Telephone Encounter (Signed)
[facility] - Client ?Nonclinical Telephone Record  ?AccessNurse? ?Client [facility] - Client ?Client Site [place] ?Provider Copland, Frederico Hamman - MD ?Contact Type Call ?Who Is Calling Patient / Member / Family / Caregiver ?Caller Name Richard Grimes ?Caller Phone Number NA, states clinic has his number. ?Patient Name Richard Grimes ?Patient DOB 1950/11/09 ?Call Type Message Only Information Provided ?Reason for Call Request to Reschedule Office Appointment ?Initial Comment Caller states he needs to reschedule his blood work. ?Patient request to speak to RN No ?Additional Comment Declined triage. ?Disp. Time Disposition Final User ?09/13/2021 7:19:41 AM General Information Provided Yes Richard Grimes, Richard ?Call Closed By: Tula Nakayama Grimes ?Transaction Date/Time: 09/13/2021 7:16:00 AM (ET ?

## 2021-09-15 ENCOUNTER — Ambulatory Visit: Payer: Medicare Other | Admitting: Cardiovascular Disease

## 2021-09-20 ENCOUNTER — Encounter: Payer: Medicare Other | Admitting: Family Medicine

## 2021-09-28 NOTE — Telephone Encounter (Signed)
Appt was moved to 11/06/21 from 09/20/21 with Dr. Lorelei Pont ?

## 2021-10-02 ENCOUNTER — Encounter: Payer: Self-pay | Admitting: Cardiovascular Disease

## 2021-10-02 ENCOUNTER — Encounter: Payer: Self-pay | Admitting: Gastroenterology

## 2021-10-02 NOTE — Telephone Encounter (Signed)
Lm on vm for patient to return call to reschedule his colonoscopy appt. I told him that his pre-visit appt has been moved and Dr. Loletha Carrow is only doing afternoon cases on 11/23/21. ?

## 2021-10-03 NOTE — Telephone Encounter (Signed)
Lm on vm for patient letting him know that his appts have been moved. I told the pt to call back at his earliest convenience if the new times do not work for him. I also told pt that I will send him a my chart message. ?

## 2021-10-09 ENCOUNTER — Other Ambulatory Visit: Payer: Self-pay | Admitting: Family Medicine

## 2021-10-09 DIAGNOSIS — D508 Other iron deficiency anemias: Secondary | ICD-10-CM

## 2021-10-09 NOTE — Telephone Encounter (Signed)
Last office visit 04/07/20 for cough.  Last refilled 03/06/21 for 454 g with no refills.  CPE scheduled for 11/06/21. ?

## 2021-10-16 ENCOUNTER — Ambulatory Visit: Payer: Medicare Other | Admitting: Cardiovascular Disease

## 2021-10-27 ENCOUNTER — Encounter: Payer: Medicare Other | Admitting: Gastroenterology

## 2021-10-30 ENCOUNTER — Other Ambulatory Visit: Payer: Self-pay | Admitting: Family Medicine

## 2021-10-30 ENCOUNTER — Encounter: Payer: Self-pay | Admitting: Pulmonary Disease

## 2021-10-30 ENCOUNTER — Other Ambulatory Visit (INDEPENDENT_AMBULATORY_CARE_PROVIDER_SITE_OTHER): Payer: Medicare Other

## 2021-10-30 DIAGNOSIS — Z79899 Other long term (current) drug therapy: Secondary | ICD-10-CM | POA: Diagnosis not present

## 2021-10-30 DIAGNOSIS — Z125 Encounter for screening for malignant neoplasm of prostate: Secondary | ICD-10-CM

## 2021-10-30 DIAGNOSIS — R739 Hyperglycemia, unspecified: Secondary | ICD-10-CM

## 2021-10-30 DIAGNOSIS — E785 Hyperlipidemia, unspecified: Secondary | ICD-10-CM

## 2021-10-30 LAB — CBC WITH DIFFERENTIAL/PLATELET
Basophils Absolute: 0.1 10*3/uL (ref 0.0–0.1)
Basophils Relative: 1.3 % (ref 0.0–3.0)
Eosinophils Absolute: 0.2 10*3/uL (ref 0.0–0.7)
Eosinophils Relative: 3.3 % (ref 0.0–5.0)
HCT: 43 % (ref 39.0–52.0)
Hemoglobin: 13.9 g/dL (ref 13.0–17.0)
Lymphocytes Relative: 18.9 % (ref 12.0–46.0)
Lymphs Abs: 1 10*3/uL (ref 0.7–4.0)
MCHC: 32.3 g/dL (ref 30.0–36.0)
MCV: 82.9 fl (ref 78.0–100.0)
Monocytes Absolute: 0.4 10*3/uL (ref 0.1–1.0)
Monocytes Relative: 8.4 % (ref 3.0–12.0)
Neutro Abs: 3.6 10*3/uL (ref 1.4–7.7)
Neutrophils Relative %: 68.1 % (ref 43.0–77.0)
Platelets: 198 10*3/uL (ref 150.0–400.0)
RBC: 5.18 Mil/uL (ref 4.22–5.81)
RDW: 14.5 % (ref 11.5–15.5)
WBC: 5.2 10*3/uL (ref 4.0–10.5)

## 2021-10-30 LAB — HEMOGLOBIN A1C: Hgb A1c MFr Bld: 6 % (ref 4.6–6.5)

## 2021-10-30 LAB — HEPATIC FUNCTION PANEL
ALT: 15 U/L (ref 0–53)
AST: 15 U/L (ref 0–37)
Albumin: 3.8 g/dL (ref 3.5–5.2)
Alkaline Phosphatase: 92 U/L (ref 39–117)
Bilirubin, Direct: 0.1 mg/dL (ref 0.0–0.3)
Total Bilirubin: 0.5 mg/dL (ref 0.2–1.2)
Total Protein: 5.8 g/dL — ABNORMAL LOW (ref 6.0–8.3)

## 2021-10-30 LAB — BASIC METABOLIC PANEL
BUN: 12 mg/dL (ref 6–23)
CO2: 27 mEq/L (ref 19–32)
Calcium: 8.6 mg/dL (ref 8.4–10.5)
Chloride: 106 mEq/L (ref 96–112)
Creatinine, Ser: 0.96 mg/dL (ref 0.40–1.50)
GFR: 80.86 mL/min (ref >60.00)
Glucose, Bld: 99 mg/dL (ref 70–99)
Potassium: 4.3 mEq/L (ref 3.5–5.1)
Sodium: 141 mEq/L (ref 135–145)

## 2021-10-30 LAB — LIPID PANEL
Cholesterol: 137 mg/dL (ref 0–200)
HDL: 54.5 mg/dL (ref >39.00)
LDL Cholesterol: 67 mg/dL (ref 0–99)
NonHDL: 82.04
Total CHOL/HDL Ratio: 3
Triglycerides: 74 mg/dL (ref 0.0–149.0)
VLDL: 14.8 mg/dL (ref 0.0–40.0)

## 2021-10-30 LAB — PSA, MEDICARE: PSA: 0.38 ng/ml (ref 0.10–4.00)

## 2021-10-30 NOTE — Addendum Note (Signed)
Addended by: Ellamae Sia on: 10/30/2021 10:02 AM ? ? Modules accepted: Orders ? ?

## 2021-10-31 NOTE — Progress Notes (Signed)
Dr. Mannam, please see mychart message sent by pt and advise. 

## 2021-11-06 ENCOUNTER — Encounter: Payer: Self-pay | Admitting: Family Medicine

## 2021-11-06 ENCOUNTER — Ambulatory Visit (INDEPENDENT_AMBULATORY_CARE_PROVIDER_SITE_OTHER): Payer: Medicare Other | Admitting: Family Medicine

## 2021-11-06 VITALS — BP 130/84 | HR 58 | Temp 97.9°F | Ht 66.0 in | Wt 190.1 lb

## 2021-11-06 DIAGNOSIS — Z0000 Encounter for general adult medical examination without abnormal findings: Secondary | ICD-10-CM | POA: Diagnosis not present

## 2021-11-06 MED ORDER — ATORVASTATIN CALCIUM 10 MG PO TABS: 10.0000 mg | ORAL_TABLET | Freq: Every day | ORAL | 3 refills | Status: DC

## 2021-11-06 NOTE — Progress Notes (Signed)
? ? ?Richard Grimes T. Richard Henshaw, MD, [facility] ?Therapist, music at [facility] ?[address] ?[place], [address] ? ?Phone: [phone]  FAX: [phone] ? ?Richard Grimes - 71 y.o. male  MRN [id number]  Date of Birth: [birth year] ? ?Date: 11/06/2021  PCP: [name], MD  Referral: [name], MD ? ?Chief Complaint  ?Patient presents with  ? Medicare Wellness  ? ? ?This visit occurred during the SARS-CoV-2 public health emergency.  Safety protocols were in place, including screening questions prior to the visit, additional usage of staff PPE, and extensive cleaning of exam room while observing appropriate contact time as indicated for disinfecting solutions.  ? ?Patient Care Team: ?[name], MD as PCP - General ?Minna Merritts, MD as PCP - Cardiology (Cardiology) ?Subjective:  ? ?Richard Grimes is a 71 y.o. pleasant patient who presents for a medicare wellness examination: ? ?Preventative Health Maintenance Visit: ? ?Health Maintenance Summary Reviewed and updated, unless pt declines services. ? ?Tobacco History Reviewed. 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 ?Check -  ?Covid? ?SHingrix? ?- declines all ? ?Colonoscopy- pending, he already has the appointment set up ? ?No covid or shingrix ? ?Aside from this, he is doing well. ? ?He does have some aortic calcification, and he did stop his Lipitor. ? ?Pulmonary function and breathing has been stable, very compliant. ? ?Health Maintenance  ?Topic Date Due  ? COVID-19 Vaccine (1) Never done  ? Zoster Vaccines- Shingrix (1 of 2) Never done  ? COLONOSCOPY (Pts 45-35yr Insurance coverage will need to be confirmed)  11/04/2020  ? INFLUENZA VACCINE  01/30/2022  ? TETANUS/TDAP  04/23/2026  ? Pneumonia Vaccine 71 Years old  Completed  ? Hepatitis C Screening   Completed  ? HPV VACCINES  Aged Out  ? Fecal DNA (Cologuard)  Discontinued  ? ? ?Immunization History  ?Administered Date(s) Administered  ? Influenza Whole 05/24/2009  ? Influenza, High Dose Seasonal PF 06/16/2018  ? Influenza,inj,Quad PF,6+ Mos 04/24/2017  ? Pneumococcal Conjugate-13 01/20/2014  ? Pneumococcal Polysaccharide-23 01/04/2011, 09/16/2017  ? Tdap 04/23/2016  ? Zoster, Live 01/20/2014  ? ? ?Patient Active Problem List  ? Diagnosis Date Noted  ? COPD (chronic obstructive pulmonary disease) with emphysema ([facility] 05/24/2009  ?  Priority: High  ? Pulmonary aspergillosis ([facility] 05/13/2020  ? Multiple lung nodules 05/03/2020  ? Coronary artery calcification seen on CT scan 09/18/2017  ? Aortic atherosclerosis ([facility] 09/16/2017  ? DEPRESSION 05/24/2009  ? GERD 05/24/2009  ? ? ?Past Medical History:  ?Diagnosis Date  ? Allergic rhinitis due to pollen 07/18/2011  ? Allergy   ? Asthma   ? as a child  ? Bronchitis   ? history  ? COPD (chronic obstructive pulmonary disease) ([facility]   ? Severe, based on Spirometry.   ? Depression   ? Emphysema/COPD ([facility]   ? Family history of colonic polyps   ? Family history of lung cancer   ? Family history of lymphoma   ? Family history of melanoma   ? Family history of skin cancer   ? GERD (gastroesophageal reflux disease)   ? History of colonic polyps 11/17/2019  ? Hx of adenomatous colonic polyps   ? Hyperlipidemia   ? Iron deficiency anemia   ? Pneumonia   ? x 1  ? ? ?  Past Surgical History:  ?Procedure Laterality Date  ? BRONCHIAL BIOPSY  05/03/2020  ? Procedure: BRONCHIAL BIOPSIES;  Surgeon: Garner Nash, DO;  Location: [facility];  Service: Pulmonary;;  ? BRONCHIAL BRUSHINGS  05/03/2020  ? Procedure: BRONCHIAL BRUSHINGS;  Surgeon: Garner Nash, DO;  Location: [facility];  Service: Pulmonary;;  ? BRONCHIAL NEEDLE ASPIRATION BIOPSY  05/03/2020  ? Procedure: BRONCHIAL NEEDLE ASPIRATION BIOPSIES;  Surgeon: Garner Nash, DO;  Location: [facility];  Service: Pulmonary;;   ? BRONCHIAL WASHINGS  05/03/2020  ? Procedure: BRONCHIAL WASHINGS;  Surgeon: Garner Nash, DO;  Location: [facility];  Service: Pulmonary;;  ? CARDIAC CATHETERIZATION  2000  ? Normal coronaries  ? COLONOSCOPY    ? 2018,2019,2021  ? NASAL POLYP SURGERY    ? 1990 and 1999  ? POLYPECTOMY    ? UPPER GASTROINTESTINAL ENDOSCOPY    ? VIDEO BRONCHOSCOPY WITH ENDOBRONCHIAL NAVIGATION Right 05/03/2020  ? Procedure: VIDEO BRONCHOSCOPY WITH ENDOBRONCHIAL NAVIGATION;  Surgeon: Garner Nash, DO;  Location: [facility];  Service: Pulmonary;  Laterality: Right;  ? ? ?Family History  ?Problem Relation Age of Onset  ? Pulmonary fibrosis Mother   ? GER disease Mother   ? Hypertension Mother   ? Colon polyps Mother   ? Hypertension Father   ? Melanoma Father 34  ?     lower back  ? Skin cancer Father 20  ?     lip  ? Alzheimer's disease Maternal Grandmother   ? Deep vein thrombosis Maternal Grandfather   ? Lymphoma Maternal Grandfather 48  ? Deep vein thrombosis Paternal Grandmother   ? Deep vein thrombosis Paternal Grandfather   ? Skin cancer Paternal Grandfather   ?     lip; dx. in his 29s  ? Colon polyps Maternal Uncle   ? Lung cancer Cousin 27  ?     smoker (maternal cousin)  ? Colon cancer Neg Hx   ? Esophageal cancer Neg Hx   ? Rectal cancer Neg Hx   ? Stomach cancer Neg Hx   ? ? ?Social History  ? ?Social History Narrative  ? Not on file  ? ? ?Past Medical History, Surgical History, Social History, Family History, Problem List, Medications, and Allergies have been reviewed and updated if relevant. ? ?Review of Systems: Pertinent positives are listed above.  Otherwise, a full 14 point review of systems has been done in full and it is negative except where it is noted positive. ? ?Objective:  ? ?BP 130/84   Pulse (!) 58   Temp 97.9 ?F (36.6 ?C) (Oral)   Ht '5\' 6"'$  (1.676 m)   Wt 190 lb 2 oz (86.2 kg)   SpO2 97%   BMI 30.69 kg/m?  ? ?  08/01/2020  ?  9:48 AM 08/29/2020  ? 10:02 AM 12/14/2020  ?  2:55 PM 07/19/2021  ?   1:56 PM 11/06/2021  ?  9:05 AM  ?Fall Risk  ?Falls in the past year? 1 0 0 0 0  ?Was there an injury with Fall? 0 0 0 0   ?Fall Risk Category Calculator 1 0 0 0   ?Fall Risk Category Low Low Low Low   ?Patient Fall Risk Level Low fall risk   Low fall risk   ?Patient at Risk for Falls Due to    No Fall Risks   ?Fall risk Follow up Falls evaluation completed   Falls evaluation completed   ? ?Ideal Body Weight:  ?Weight in (lb)  to have BMI = 25: 154.6 ?Hearing Screening  ?Method: Audiometry  ? '500Hz'$  '1000Hz'$  '2000Hz'$  '4000Hz'$   ?Right ear '20 20 20 20  '$ ?Left ear '20 20 20 '$ 0  ? ?Vision Screening  ? Right eye Left eye Both eyes  ?Without correction     ?With correction '20/15 20/13 20/13 '$  ? ? ?  11/06/2021  ?  9:05 AM 07/19/2021  ?  1:57 PM 12/14/2020  ?  2:55 PM 12/14/2020  ?  2:53 PM 08/29/2020  ? 10:02 AM  ?Depression screen PHQ 2/9  ?Decreased Interest 0 0 0 0 0  ?Down, Depressed, Hopeless 0 0 0 0 0  ?PHQ - 2 Score 0 0 0 0 0  ? ? ? ?GEN: well developed, well nourished, no acute distress ?Eyes: conjunctiva and lids normal, PERRLA, EOMI ?ENT: TM clear, nares clear, oral exam WNL ?Neck: supple, no lymphadenopathy, no thyromegaly, no JVD ?Pulm: clear to auscultation and percussion, respiratory effort normal ?CV: regular rate and rhythm, S1-S2, no murmur, rub or gallop, no bruits, peripheral pulses normal and symmetric, no cyanosis, clubbing, edema or varicosities ?GI: soft, non-tender; no hepatosplenomegaly, masses; active bowel sounds all quadrants ?GU: deferred ?Lymph: no cervical, axillary or inguinal adenopathy ?MSK: gait normal, muscle tone and strength WNL, no joint swelling, effusions, discoloration, crepitus  ?SKIN: clear, good turgor, color WNL, no rashes, lesions, or ulcerations ?Neuro: normal mental status, normal strength, sensation, and motion ?Psych: alert; oriented to person, place and time, normally interactive and not anxious or depressed in appearance. ? ?All labs reviewed with patient. ? ?Results for orders placed or  performed in visit on 10/30/21  ?PSA, Medicare  ?Result Value Ref Range  ? PSA 0.38 0.10 - 4.00 ng/ml  ?Lipid panel  ?Result Value Ref Range  ? Cholesterol 137 0 - 200 mg/dL  ? Triglycerides 74.0 0.0 - 149.0 mg/dL  ? HDL

## 2021-11-07 ENCOUNTER — Ambulatory Visit (AMBULATORY_SURGERY_CENTER): Payer: Medicare Other

## 2021-11-07 VITALS — Ht 66.0 in | Wt 190.0 lb

## 2021-11-07 DIAGNOSIS — Z86010 Personal history of colonic polyps: Secondary | ICD-10-CM

## 2021-11-07 MED ORDER — PLENVU 140 G PO SOLR: 1.0000 | ORAL | 0 refills | Status: DC

## 2021-11-07 NOTE — Progress Notes (Signed)
No egg or soy allergy known to patient  ?No issues known to pt with past sedation with any surgeries or procedures ?Patient denies ever being told they had issues or difficulty with intubation  ?No FH of Malignant Hyperthermia ?Pt is not on diet pills ?Pt is not on home 02  ?Pt is not on blood thinners  ?Pt denies issues with constipation;  ?No A fib or A flutter ?Medicare Plenvu coupon to pt in [place] today and code sent to pharmacy via phone call; ?NO PA's for preps discussed with pt in PV today  ?Discussed with pt there will be an out-of-pocket cost for prep and that varies from $0 to 70 + dollars - pt verbalized understanding  ?Pt instructed to use Singlecare.com or GoodRx for a price reduction on prep  ?PV completed over the phone. Pt verified name, DOB, address and insurance during PV today.  ?Pt mailed instruction packet with copy of consent form to read and not return, and instructions.  ?Pt encouraged to call with questions or issues.  ?Pt has My chart, procedure instructions sent via My Chart  ?Insurance confirmed with pt at [facility] today  ? ?

## 2021-11-14 NOTE — Progress Notes (Signed)
Cardiology Office Note ? ?Date:  11/15/2021  ? ?ID:  RAMAL Grimes, DOB 1951/06/05, MRN [id number] ? ?PCP:  [name], MD  ? ?Chief Complaint  ?Patient presents with  ? 12 month follow up   ?  "Doing well." Medications reviewed by the patient verbally.   ? ? ?HPI:  ?Richard Grimes is a 71 year old gentleman with past medical history of ?Smoking , quit 2004 ?Centrolobular emphysema, ?Aortic calcification, seen on CT scan ?Who presents for f/u of his aortic atherosclerosis, coronary calcification ? 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 ?Recent CT chest February 2023 discussed, images pulled up and reviewed ?Coronary calcium , mild in LAD and LCX, ?Mild aortic athero ? ?Labs reviewed: ?A1C 6.0 ?Total cholesterol 139 ?When he recently restarted on his Lipitor 10, cholesterol numbers within goal but higher than prior numbers ? ?On inhalers, better on Trelegy ?Feels his breathing is stable on the inhaler ? ?EKG personally reviewed by myself on todays visit ?Shows normal sinus rhythm rate 78bpm right bundle branch block  ? ? ?PMH:   has a past medical history of Allergic rhinitis due to pollen (07/18/2011), Allergy, Asthma, Bronchitis, COPD (chronic obstructive pulmonary disease) ([place]), Depression, Emphysema/COPD ([place]), Family history of colonic polyps, Family history of lung cancer, Family history of lymphoma, Family history of melanoma, Family history of skin cancer, GERD (gastroesophageal reflux disease), History of colonic polyps (11/17/2019), adenomatous colonic polyps, Hyperlipidemia, Iron deficiency anemia, and Pneumonia. ? ?PSH:    ?Past Surgical History:  ?Procedure Laterality Date  ? BRONCHIAL BIOPSY  05/03/2020  ? Procedure: BRONCHIAL BIOPSIES;  Surgeon: Garner Nash, DO;  Location: [facility];  Service: Pulmonary;;  ? BRONCHIAL BRUSHINGS  05/03/2020  ?  Procedure: BRONCHIAL BRUSHINGS;  Surgeon: Garner Nash, DO;  Location: [facility];  Service: Pulmonary;;  ? BRONCHIAL NEEDLE ASPIRATION BIOPSY  05/03/2020  ? Procedure: BRONCHIAL NEEDLE ASPIRATION BIOPSIES;  Surgeon: Garner Nash, DO;  Location: [facility];  Service: Pulmonary;;  ? BRONCHIAL WASHINGS  05/03/2020  ? Procedure: BRONCHIAL WASHINGS;  Surgeon: Garner Nash, DO;  Location: [facility];  Service: Pulmonary;;  ? CARDIAC CATHETERIZATION  2000  ? Normal coronaries  ? COLONOSCOPY    ? 2018,2019,2021  ? COLONOSCOPY  2021  ? HD-MAC-mira (good after lavage)-polyps  ? NASAL POLYP SURGERY    ? 1990 and 1999  ? POLYPECTOMY    ? UPPER GASTROINTESTINAL ENDOSCOPY    ? VIDEO BRONCHOSCOPY WITH ENDOBRONCHIAL NAVIGATION Right 05/03/2020  ? Procedure: VIDEO BRONCHOSCOPY WITH ENDOBRONCHIAL NAVIGATION;  Surgeon: Garner Nash, DO;  Location: [facility];  Service: Pulmonary;  Laterality: Right;  ? ? ?Current Outpatient Medications  ?Medication Sig Dispense Refill  ? aspirin EC 81 MG tablet Take 81 mg by mouth daily. Swallow whole.    ? atorvastatin (LIPITOR) 10 MG tablet Take 1 tablet (10 mg total) by mouth daily. 90 tablet 3  ? ECHINACEA EXTRACT PO Take 1,520 mg by mouth at bedtime. 760 mg each    ? Fluticasone-Umeclidin-Vilant (TRELEGY ELLIPTA) 100-62.5-25 MCG/ACT AEPB     ? L-Arginine 500 MG CAPS Take 500 mg by mouth at bedtime.    ? levOCARNitine L-Tartrate (L-CARNITINE) 500 MG CAPS Take 500 mg by mouth at bedtime.    ? Multiple Minerals-Vitamins (CAL-MAG-ZINC-D) TABS Take 500 mg by mouth at bedtime.    ? Multiple Vitamins-Minerals (  MULTIVITAMIN WITH MINERALS) tablet Take 1 tablet by mouth daily. Centrum Silver men 50+    ? omeprazole (PRILOSEC) 20 MG capsule TAKE 2 CAPSULES BY MOUTH DAILY 180 capsule 0  ? PROVENTIL HFA 108 (90 Base) MCG/ACT inhaler INHALE TWO PUFFS BY MOUTH EVERY 6 HOURS AS NEEDED FOR WHEEZING OR  SHORTNESS  OF  BREATH (Patient taking differently: Inhale 2 puffs into the lungs every  6 (six) hours as needed for wheezing or shortness of breath.) 7 each 3  ? triamcinolone cream (KENALOG) 0.1 % APPLY 1 APPLICATION TOPICALLY TWICE DAILY AS NEEDED 454 g 0  ? Turmeric Curcumin 500 MG CAPS Take 500 mg by mouth at bedtime.    ? Budeson-Glycopyrrol-Formoterol (BREZTRI AEROSPHERE) 160-9-4.8 MCG/ACT AERO Inhale 2 puffs into the lungs 2 (two) times daily. (Patient not taking: Reported on 11/15/2021) 10.7 g 5  ? PEG-KCl-NaCl-NaSulf-Na Asc-C (PLENVU) 140 g SOLR Take 1 kit by mouth as directed. Patient to bring in coupon for price reduction (Patient not taking: Reported on 11/15/2021) 1 each 0  ? ?No current facility-administered medications for this visit.  ? ? ? ?Allergies:   Penicillins  ? ?Social History:  The patient  reports that he quit smoking about 17 years ago. His smoking use included cigarettes. He has a 70.00 pack-year smoking history. He has been exposed to tobacco smoke. He has never used smokeless tobacco. He reports that he does not currently use alcohol. He reports that he does not use drugs.  ? ?Family History:   family history includes Alzheimer's disease in his maternal grandmother; Colon polyps in his maternal uncle; Colon polyps (age of onset: 59) in his mother; Deep vein thrombosis in his maternal grandfather, paternal grandfather, and paternal grandmother; GER disease in his mother; Hypertension in his father and mother; Lung cancer (age of onset: 37) in his cousin; Lymphoma (age of onset: 55) in his maternal grandfather; Melanoma (age of onset: 34) in his father; Pulmonary fibrosis in his mother; Skin cancer in his paternal grandfather; Skin cancer (age of onset: 77) in his father.  ? ? ?Review of Systems: ?Review of Systems  ?Constitutional: Negative.   ?Respiratory:  Positive for shortness of breath.   ?Cardiovascular: Negative.   ?Gastrointestinal: Negative.   ?Musculoskeletal: Negative.   ?Neurological: Negative.   ?Psychiatric/Behavioral: Negative.    ?All other systems reviewed  and are negative. ? ? ?PHYSICAL EXAM: ?VS:  BP 120/80 (BP Location: Left Arm, Patient Position: Sitting, Cuff Size: Normal)   Pulse 78   Ht _0  (1.676 m)   Wt 195 lb (88.5 kg)   SpO2 97%   BMI 31.47 kg/m?  , BMI Body mass index is 31.47 kg/m?Marland Kitchen ?Constitutional:  oriented to person, place, and time. No distress.  ?HENT:  ?Head: Grossly normal ?Eyes:  no discharge. No scleral icterus.  ?Neck: No JVD, no carotid bruits  ?Cardiovascular: Regular rate and rhythm, no murmurs appreciated ?Pulmonary/Chest: Clear to auscultation bilaterally, no wheezes or rails ?Abdominal: Soft.  no distension.  no tenderness.  ?Musculoskeletal: Normal range of motion ?Neurological:  normal muscle tone. Coordination normal. No atrophy ?Skin: Skin warm and dry ?Psychiatric: normal affect, pleasant ? ?Recent Labs: ?10/30/2021: ALT 15; BUN 12; Creatinine, Ser 0.96; Hemoglobin 13.9; Platelets 198.0; Potassium 4.3; Sodium 141  ? ? ?Lipid Panel ?Lab Results  ?Component Value Date  ? CHOL 137 10/30/2021  ? HDL 54.50 10/30/2021  ? [place] 67 10/30/2021  ? TRIG 74.0 10/30/2021  ? ?  ? ?Wt Readings from Last 3 Encounters:  ?  11/15/21 195 lb (88.5 kg)  ?11/07/21 190 lb (86.2 kg)  ?11/06/21 190 lb 2 oz (86.2 kg)  ?  ? ? ?ASSESSMENT AND PLAN: ? ?Aortic atherosclerosis ([place]) ?Mild aortic atherosclerosis noted on CT scan in the arch ?Back on lipitor ?Goal LDL less than 70 preferably less than 55 ?On aspirin every other day ? 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 ?Panlobular emphysema ([place]) ?Long history of smoking, has chronic shortness of breath on Trelegy ?Working regular job, very active at baseline which helps ? ?Coronary artery calcification seen on CT scan - ?mild in the LAD,  mid circumflex ?Asa every other day ? ?Smoker ? stopped many years ago ?Underlying emphysema on CT scan ? ? Total encounter time more than 30 minutes ? Greater than 50% was spent in  counseling and coordination of care with the patient ? ? ?Orders Placed This Encounter  ?Procedures  ? EKG 12-Lead  ? ? ? ?Signed, ?Esmond Plants, M.D., Ph.D. ?11/15/2021  ?[facility], [place]

## 2021-11-15 ENCOUNTER — Encounter: Payer: Self-pay | Admitting: Cardiovascular Disease

## 2021-11-15 ENCOUNTER — Ambulatory Visit: Payer: Medicare Other | Admitting: Cardiovascular Disease

## 2021-11-15 VITALS — BP 120/80 | HR 78 | Ht 66.0 in | Wt 195.0 lb

## 2021-11-15 DIAGNOSIS — Z87891 Personal history of nicotine dependence: Secondary | ICD-10-CM | POA: Diagnosis not present

## 2021-11-15 DIAGNOSIS — I700 Atherosclerosis of aorta: Secondary | ICD-10-CM

## 2021-11-15 DIAGNOSIS — I2510 Atherosclerotic heart disease of native coronary artery without angina pectoris: Secondary | ICD-10-CM

## 2021-11-15 NOTE — Patient Instructions (Signed)
Medication Instructions:  No changes  If you need a refill on your cardiac medications before your next appointment, please call your pharmacy.   Lab work: No new labs needed  Testing/Procedures: No new testing needed  Follow-Up: At [facility], you and your health needs are our priority.  As part of our continuing mission to provide you with exceptional heart care, we have created designated Provider Care Teams.  These Care Teams include your primary Cardiologist (physician) and Advanced Practice Providers (APPs -  Physician Assistants and Nurse Practitioners) who all work together to provide you with the care you need, when you need it.  You will need a follow up appointment in 12 months  Providers on your designated Care Team:   Christopher Berge, NP Ryan Dunn, PA-C Cadence Furth, PA-C  COVID-19 Vaccine Information can be found at: [email or web address] For questions related to vaccine distribution or appointments, please email [email or web address] or call [phone].   

## 2021-11-17 NOTE — Telephone Encounter (Signed)
Okay to send a letter requesting an exception Please state that he has tried all the inhalers on the list but he either could not tolerate them or had worsening symptoms on the medications.  The limit of refills given to him is not helpful as he is running out of medications and will need an urgent refill on trelegy

## 2021-11-20 ENCOUNTER — Other Ambulatory Visit (HOSPITAL_COMMUNITY): Payer: Self-pay

## 2021-11-20 ENCOUNTER — Telehealth: Payer: Self-pay | Admitting: Pharmacy Technician

## 2021-11-20 NOTE — Telephone Encounter (Signed)
PA has been submitted for Trelegy 170mg

## 2021-11-20 NOTE — Telephone Encounter (Signed)
Routing all this to prior auth team for them to further help [place] out with all this and to get the letter taken care of too.

## 2021-11-20 NOTE — Telephone Encounter (Signed)
Patient Advocate Encounter  Received notification from PATIENT/PROVIDER'S OFFICE that prior authorization for TRELEGY 100MCG is required.   PA submitted on 5.22.23 Key [id number] Status is pending   [facility] will continue to follow  Luciano Cutter, CPhT Patient Advocate Phone: [phone]

## 2021-11-21 ENCOUNTER — Encounter: Payer: Self-pay | Admitting: Gastroenterology

## 2021-11-22 ENCOUNTER — Other Ambulatory Visit (HOSPITAL_COMMUNITY): Payer: Self-pay

## 2021-11-23 ENCOUNTER — Ambulatory Visit (AMBULATORY_SURGERY_CENTER): Payer: Medicare Other | Admitting: Gastroenterology

## 2021-11-23 ENCOUNTER — Encounter: Payer: Self-pay | Admitting: Gastroenterology

## 2021-11-23 VITALS — BP 116/66 | HR 64 | Temp 97.7°F | Resp 19 | Ht 66.0 in | Wt 190.0 lb

## 2021-11-23 DIAGNOSIS — Z86010 Personal history of colonic polyps: Secondary | ICD-10-CM | POA: Diagnosis not present

## 2021-11-23 DIAGNOSIS — D125 Benign neoplasm of sigmoid colon: Secondary | ICD-10-CM

## 2021-11-23 DIAGNOSIS — D123 Benign neoplasm of transverse colon: Secondary | ICD-10-CM | POA: Diagnosis not present

## 2021-11-23 DIAGNOSIS — D124 Benign neoplasm of descending colon: Secondary | ICD-10-CM

## 2021-11-23 MED ORDER — SODIUM CHLORIDE 0.9 % IV SOLN: 500.0000 mL | Freq: Once | INTRAVENOUS | Status: DC

## 2021-11-23 NOTE — Progress Notes (Signed)
History and Physical:  This patient presents for endoscopic testing for: Encounter Diagnosis  Name Primary?   Personal history of colonic polyps Yes    Many adenomatous colon polyps in May 2018, 2019 and 2021. Patient here (overdue) for surveillance colonoscopy. Patient denies chronic abdominal pain, rectal bleeding, constipation or diarrhea.   Patient is otherwise without complaints or active issues today.   Past Medical History: Past Medical History:  Diagnosis Date   Allergic rhinitis due to pollen 07/18/2011   Allergy    Asthma    as a child   Bronchitis    history   COPD (chronic obstructive pulmonary disease) (HCC)    Severe, based on Spirometry.    Depression    Emphysema/COPD [facility])    Family history of colonic polyps    Family history of lung cancer    Family history of lymphoma    Family history of melanoma    Family history of skin cancer    GERD (gastroesophageal reflux disease)    on meds   History of colonic polyps 11/17/2019   Hx of adenomatous colonic polyps    Hyperlipidemia    on meds   Iron deficiency anemia    Pneumonia    x 1     Past Surgical History: Past Surgical History:  Procedure Laterality Date   BRONCHIAL BIOPSY  05/03/2020   Procedure: BRONCHIAL BIOPSIES;  Surgeon: Garner Nash, DO;  Location: [facility];  Service: Pulmonary;;   BRONCHIAL BRUSHINGS  05/03/2020   Procedure: BRONCHIAL BRUSHINGS;  Surgeon: Garner Nash, DO;  Location: [facility];  Service: Pulmonary;;   BRONCHIAL NEEDLE ASPIRATION BIOPSY  05/03/2020   Procedure: BRONCHIAL NEEDLE ASPIRATION BIOPSIES;  Surgeon: Garner Nash, DO;  Location: [facility];  Service: Pulmonary;;   BRONCHIAL WASHINGS  05/03/2020   Procedure: BRONCHIAL WASHINGS;  Surgeon: Garner Nash, DO;  Location: [facility];  Service: Pulmonary;;   CARDIAC CATHETERIZATION  2000   Normal coronaries   COLONOSCOPY     2018,2019,2021   COLONOSCOPY  2021   HD-MAC-mira (good after  lavage)-polyps   [place] Right 05/03/2020   Procedure: VIDEO BRONCHOSCOPY WITH ENDOBRONCHIAL NAVIGATION;  Surgeon: Garner Nash, DO;  Location: [facility];  Service: Pulmonary;  Laterality: Right;    Allergies: Allergies  Allergen Reactions   Penicillins Rash    Reaction: 50 Years ago    Outpatient Meds: Current Outpatient Medications  Medication Sig Dispense Refill   aspirin EC 81 MG tablet Take 81 mg by mouth daily. Swallow whole.     atorvastatin (LIPITOR) 10 MG tablet Take 1 tablet (10 mg total) by mouth daily. 90 tablet 3   ECHINACEA EXTRACT PO Take 1,520 mg by mouth at bedtime. 760 mg each     Fluticasone-Umeclidin-Vilant (TRELEGY ELLIPTA) 100-62.5-25 MCG/ACT AEPB      L-Arginine 500 MG CAPS Take 500 mg by mouth at bedtime.     levOCARNitine L-Tartrate (L-CARNITINE) 500 MG CAPS Take 500 mg by mouth at bedtime.     Multiple Minerals-Vitamins (CAL-MAG-ZINC-D) TABS Take 500 mg by mouth at bedtime.     Multiple Vitamins-Minerals (MULTIVITAMIN WITH MINERALS) tablet Take 1 tablet by mouth daily. Centrum Silver men 50+     omeprazole (PRILOSEC) 20 MG capsule TAKE 2 CAPSULES BY MOUTH DAILY 180 capsule 0   triamcinolone cream (KENALOG) 0.1 %  APPLY 1 APPLICATION TOPICALLY TWICE DAILY AS NEEDED 454 g 0   Turmeric Curcumin 500 MG CAPS Take 500 mg by mouth at bedtime.     Budeson-Glycopyrrol-Formoterol (BREZTRI AEROSPHERE) 160-9-4.8 MCG/ACT AERO Inhale 2 puffs into the lungs 2 (two) times daily. (Patient not taking: Reported on 11/15/2021) 10.7 g 5   PROVENTIL HFA 108 (90 Base) MCG/ACT inhaler INHALE TWO PUFFS BY MOUTH EVERY 6 HOURS AS NEEDED FOR WHEEZING OR  SHORTNESS  OF  BREATH (Patient taking differently: Inhale 2 puffs into the lungs every 6 (six) hours as needed for wheezing or shortness of breath.) 7 each 3   Current Facility-Administered  Medications  Medication Dose Route Frequency Provider Last Rate Last Admin   0.9 %  sodium chloride infusion  500 mL Intravenous Once Cirigliano, Parc, DO          ___________________________________________________________________ Objective   Exam:  BP (!) 145/68   Pulse 70   Temp 97.7 F (36.5 C) (Temporal)   Ht _0  (1.676 m)   Wt 190 lb (86.2 kg)   SpO2 94%   BMI 30.67 kg/m   CV: RRR without murmur, S1/S2 Resp: clear to auscultation bilaterally, normal RR and effort noted GI: soft, no tenderness, with active bowel sounds.   Assessment: Encounter Diagnosis  Name Primary?   Personal history of colonic polyps Yes     Plan: Colonoscopy  The benefits and risks of the planned procedure were described in detail with the patient or (when appropriate) their health care proxy.  Risks were outlined as including, but not limited to, bleeding, infection, perforation, adverse medication reaction leading to cardiac or pulmonary decompensation, pancreatitis (if ERCP).  The limitation of incomplete mucosal visualization was also discussed.  No guarantees or warranties were given.    The patient is appropriate for an endoscopic procedure in the ambulatory setting.   - Wilfrid Lund, MD

## 2021-11-23 NOTE — Op Note (Signed)
[facility] Patient Name: Asbury Hair Procedure Date: 11/23/2021 1:25 PM MRN: [id number] Endoscopist: Mallie Mussel L. Loletha Carrow , MD Age: 71 Referring MD:  Date of Birth: 1951/01/26 Gender: Male Account #: [id number] Procedure:                Colonoscopy Indications:              Increased risk colon cancer surveillance: Personal                            history of adenoma (10 mm or greater in size)                           TA x 13 Nov 2016                           TA x 10 05/2018                           TA x 7 (including one > 80m) May 2021                           Negative genetic testing 2021 Medicines:                Monitored Anesthesia Care Procedure:                Pre-Anesthesia Assessment:                           - Prior to the procedure, a History and Physical                            was performed, and patient medications and                            allergies were reviewed. The patient's tolerance of                            previous anesthesia was also reviewed. The risks                            and benefits of the procedure and the sedation                            options and risks were discussed with the patient.                            All questions were answered, and informed consent                            was obtained. Prior Anticoagulants: The patient has                            taken no previous anticoagulant or antiplatelet  agents. ASA Grade Assessment: II - A patient with                            mild systemic disease. After reviewing the risks                            and benefits, the patient was deemed in                            satisfactory condition to undergo the procedure.                           After obtaining informed consent, the colonoscope                            was passed under direct vision. Throughout the                            procedure, the patient's blood pressure,  pulse, and                            oxygen saturations were monitored continuously. The                            CF [id number] #[id number] was introduced through the anus                            and advanced to the the cecum, identified by                            appendiceal orifice and ileocecal valve. The                            colonoscopy was performed without difficulty. The                            patient tolerated the procedure well. The quality                            of the bowel preparation was good. The ileocecal                            valve, appendiceal orifice, and rectum were                            photographed. Scope In: 1:34:09 PM Scope Out: 1:54:57 PM Scope Withdrawal Time: 0 hours 17 minutes 6 seconds  Total Procedure Duration: 0 hours 20 minutes 48 seconds  Findings:                 Repeat examination of right colon under NBI                            performed.  Five sessile polyps were found in the sigmoid                            colon, descending colon and transverse colon. The                            polyps were 3 to 5 mm in size. These polyps were                            removed with a cold snare. Resection and retrieval                            were complete.                           (most polyps appeared likely adenomatous under WL                            and NBI; distal sigmoid polyp may be hyperplastic)                           A few small-mouthed diverticula were found in the                            sigmoid colon.                           The exam was otherwise without abnormality on                            direct and retroflexion views. Complications:            No immediate complications. Estimated Blood Loss:     Estimated blood loss was minimal. Impression:               - Five 3 to 5 mm polyps in the sigmoid colon, in                            the descending colon and in the  transverse colon,                            removed with a cold snare. Resected and retrieved.                           - Diverticulosis in the sigmoid colon.                           - The examination was otherwise normal on direct                            and retroflexion views. Recommendation:           - Patient has a contact number available for  emergencies. The signs and symptoms of potential                            delayed complications were discussed with the                            patient. Return to normal activities tomorrow.                            Written discharge instructions were provided to the                            patient.                           - Resume previous diet.                           - Continue present medications.                           - Await pathology results.                           - Repeat colonoscopy in 2 years for surveillance. Dionta Larke L. Loletha Carrow, MD 11/23/2021 2:00:58 PM This report has been signed electronically.

## 2021-11-23 NOTE — Progress Notes (Signed)
To Pacu, VSS. Report to [email or web address] 

## 2021-11-23 NOTE — Progress Notes (Signed)
Called to room to assist during endoscopic procedure.  Patient ID and intended procedure confirmed with present staff. Received instructions for my participation in the procedure from the performing physician.  

## 2021-11-23 NOTE — Progress Notes (Signed)
Pt's states no medical or surgical changes since previsit or office visit. 

## 2021-11-23 NOTE — Patient Instructions (Signed)
Please read handouts provided. Continue present medications. Await pathology results. Repeat colonoscopy in 2 years for screening.   YOU HAD AN ENDOSCOPIC PROCEDURE TODAY AT [facility]:   Refer to the procedure report that was given to you for any specific questions about what was found during the examination.  If the procedure report does not answer your questions, please call your gastroenterologist to clarify.  If you requested that your care partner not be given the details of your procedure findings, then the procedure report has been included in a sealed envelope for you to review at your convenience later.  YOU SHOULD EXPECT: Some feelings of bloating in the abdomen. Passage of more gas than usual.  Walking can help get rid of the air that was put into your GI tract during the procedure and reduce the bloating. If you had a lower endoscopy (such as a colonoscopy or flexible sigmoidoscopy) you may notice spotting of blood in your stool or on the toilet paper. If you underwent a bowel prep for your procedure, you may not have a normal bowel movement for a few days.  Please Note:  You might notice some irritation and congestion in your nose or some drainage.  This is from the oxygen used during your procedure.  There is no need for concern and it should clear up in a day or so.  SYMPTOMS TO REPORT IMMEDIATELY:  Following lower endoscopy (colonoscopy or flexible sigmoidoscopy):  Excessive amounts of blood in the stool  Significant tenderness or worsening of abdominal pains  Swelling of the abdomen that is new, acute  Fever of 100F or higher   For urgent or emergent issues, a gastroenterologist can be reached at any hour by calling [phone]. Do not use MyChart messaging for urgent concerns.    DIET:  We do recommend a small meal at first, but then you may proceed to your regular diet.  Drink plenty of fluids but you should avoid alcoholic beverages for 24  hours.  ACTIVITY:  You should plan to take it easy for the rest of today and you should NOT DRIVE or use heavy machinery until tomorrow (because of the sedation medicines used during the test).    FOLLOW UP: Our staff will call the number listed on your records 48-72 hours following your procedure to check on you and address any questions or concerns that you may have regarding the information given to you following your procedure. If we do not reach you, we will leave a message.  We will attempt to reach you two times.  During this call, we will ask if you have developed any symptoms of COVID 19. If you develop any symptoms (ie: fever, flu-like symptoms, shortness of breath, cough etc.) before then, please call [phone].  If you test positive for Covid 19 in the 2 weeks post procedure, please call and report this information to [place].    If any biopsies were taken you will be contacted by phone or by letter within the next 1-3 weeks.  Please call us at [phone] if you have not heard about the biopsies in 3 weeks.    SIGNATURES/CONFIDENTIALITY: You and/or your care partner have signed paperwork which will be entered into your electronic medical record.  These signatures attest to the fact that that the information above on your After Visit Summary has been reviewed and is understood.  Full responsibility of the confidentiality of this discharge information lies with you and/or your care-partner.

## 2021-11-24 ENCOUNTER — Other Ambulatory Visit (HOSPITAL_COMMUNITY): Payer: Self-pay

## 2021-11-24 ENCOUNTER — Telehealth: Payer: Self-pay

## 2021-11-24 NOTE — Telephone Encounter (Signed)
  Follow up Call-     11/23/2021    1:00 PM 11/05/2019    8:39 AM  Call back number  Post procedure Call Back phone  # [phone]  Permission to leave phone message Yes Yes     Patient questions:  Do you have a fever, pain , or abdominal swelling? No. Pain Score  0 *  Have you tolerated food without any problems? Yes.    Have you been able to return to your normal activities? Yes.    Do you have any questions about your discharge instructions: Diet   No. Medications  No. Follow up visit  No.  Do you have questions or concerns about your Care? No.  Actions: * If pain score is 4 or above: No action needed, pain <4.

## 2021-11-24 NOTE — Telephone Encounter (Signed)
  Follow up Call-     11/23/2021    1:00 PM 11/05/2019    8:39 AM  Call back number  Post procedure Call Back phone  # [phone]  Permission to leave phone message Yes Yes    First follow up phone call, LVM

## 2021-11-24 NOTE — Telephone Encounter (Signed)
PA team any update on trelegy 100 PA?   Thanks!

## 2021-12-01 ENCOUNTER — Encounter: Payer: Self-pay | Admitting: Gastroenterology

## 2021-12-04 ENCOUNTER — Other Ambulatory Visit (HOSPITAL_COMMUNITY): Payer: Self-pay

## 2021-12-08 MED ORDER — TRELEGY ELLIPTA 100-62.5-25 MCG/ACT IN AEPB
1.0000 | INHALATION_SPRAY | Freq: Every day | RESPIRATORY_TRACT | 3 refills | Status: AC
Start: 2021-12-08 — End: 2022-03-08

## 2022-01-13 ENCOUNTER — Other Ambulatory Visit: Payer: Self-pay | Admitting: Family Medicine

## 2022-01-13 DIAGNOSIS — D508 Other iron deficiency anemias: Secondary | ICD-10-CM

## 2022-02-14 ENCOUNTER — Ambulatory Visit
Admission: RE | Admit: 2022-02-14 | Discharge: 2022-02-14 | Disposition: A | Discharge status: Home / Self Care | Payer: Medicare Other | Source: Ambulatory Visit | Attending: Pulmonary Disease | Admitting: Pulmonary Disease

## 2022-02-14 DIAGNOSIS — I700 Atherosclerosis of aorta: Secondary | ICD-10-CM | POA: Diagnosis not present

## 2022-02-14 DIAGNOSIS — R918 Other nonspecific abnormal finding of lung field: Secondary | ICD-10-CM | POA: Diagnosis not present

## 2022-02-14 DIAGNOSIS — J432 Centrilobular emphysema: Secondary | ICD-10-CM | POA: Diagnosis not present

## 2022-02-14 DIAGNOSIS — I2510 Atherosclerotic heart disease of native coronary artery without angina pectoris: Secondary | ICD-10-CM | POA: Diagnosis not present

## 2022-07-19 ENCOUNTER — Other Ambulatory Visit: Payer: Self-pay | Admitting: Family Medicine

## 2022-07-19 ENCOUNTER — Encounter: Payer: Self-pay | Admitting: Family Medicine

## 2022-07-19 MED ORDER — TRIAMCINOLONE ACETONIDE 0.1 % EX CREA
TOPICAL_CREAM | Freq: Two times a day (BID) | CUTANEOUS | 0 refills | Status: DC
Start: 2022-07-19 — End: 2023-07-16

## 2022-07-19 NOTE — Telephone Encounter (Signed)
Last office visit 11/06/21 for Richard Grimes.  Last refilled 10/09/21 for 454 g with no refills.  No future appointments.

## 2022-07-19 NOTE — Telephone Encounter (Signed)
See refill request.

## 2022-10-04 ENCOUNTER — Other Ambulatory Visit: Payer: Self-pay | Admitting: Family Medicine

## 2022-10-04 DIAGNOSIS — D508 Other iron deficiency anemias: Secondary | ICD-10-CM

## 2022-10-04 NOTE — Telephone Encounter (Signed)
Please schedule Medicare Wellness with Nurse and CPE with fasting labs prior with Dr. Lorelei Pont for after 11/07/2022.

## 2022-10-04 NOTE — Telephone Encounter (Signed)
LVM for patient TCB and sched

## 2022-10-04 NOTE — Telephone Encounter (Signed)
Patient has been scheduled

## 2022-10-09 ENCOUNTER — Other Ambulatory Visit: Payer: Self-pay | Admitting: Family Medicine

## 2022-11-04 ENCOUNTER — Other Ambulatory Visit: Payer: Self-pay | Admitting: Family Medicine

## 2022-11-04 DIAGNOSIS — Z125 Encounter for screening for malignant neoplasm of prostate: Secondary | ICD-10-CM

## 2022-11-04 DIAGNOSIS — R739 Hyperglycemia, unspecified: Secondary | ICD-10-CM

## 2022-11-04 DIAGNOSIS — E785 Hyperlipidemia, unspecified: Secondary | ICD-10-CM

## 2022-11-04 DIAGNOSIS — Z79899 Other long term (current) drug therapy: Secondary | ICD-10-CM

## 2022-11-05 ENCOUNTER — Other Ambulatory Visit (INDEPENDENT_AMBULATORY_CARE_PROVIDER_SITE_OTHER): Payer: Medicare Other

## 2022-11-05 DIAGNOSIS — E785 Hyperlipidemia, unspecified: Secondary | ICD-10-CM

## 2022-11-05 DIAGNOSIS — Z79899 Other long term (current) drug therapy: Secondary | ICD-10-CM | POA: Diagnosis not present

## 2022-11-05 DIAGNOSIS — R739 Hyperglycemia, unspecified: Secondary | ICD-10-CM

## 2022-11-05 DIAGNOSIS — Z125 Encounter for screening for malignant neoplasm of prostate: Secondary | ICD-10-CM | POA: Diagnosis not present

## 2022-11-05 LAB — CBC WITH DIFFERENTIAL/PLATELET
Basophils Absolute: 0 10*3/uL (ref 0.0–0.1)
Basophils Relative: 0.4 % (ref 0.0–3.0)
Eosinophils Absolute: 0 10*3/uL (ref 0.0–0.7)
Eosinophils Relative: 0.2 % (ref 0.0–5.0)
HCT: 46.9 % (ref 39.0–52.0)
Hemoglobin: 15.3 g/dL (ref 13.0–17.0)
Lymphocytes Relative: 11.1 % — ABNORMAL LOW (ref 12.0–46.0)
Lymphs Abs: 0.8 10*3/uL (ref 0.7–4.0)
MCHC: 32.6 g/dL (ref 30.0–36.0)
MCV: 83 fl (ref 78.0–100.0)
Monocytes Absolute: 0.2 10*3/uL (ref 0.1–1.0)
Monocytes Relative: 2.3 % — ABNORMAL LOW (ref 3.0–12.0)
Neutro Abs: 6.3 10*3/uL (ref 1.4–7.7)
Neutrophils Relative %: 86 % — ABNORMAL HIGH (ref 43.0–77.0)
Platelets: 237 10*3/uL (ref 150.0–400.0)
RBC: 5.65 Mil/uL (ref 4.22–5.81)
RDW: 14.6 % (ref 11.5–15.5)
WBC: 7.3 10*3/uL (ref 4.0–10.5)

## 2022-11-05 LAB — BASIC METABOLIC PANEL
BUN: 12 mg/dL (ref 6–23)
CO2: 28 mEq/L (ref 19–32)
Calcium: 9.1 mg/dL (ref 8.4–10.5)
Chloride: 103 mEq/L (ref 96–112)
Creatinine, Ser: 0.89 mg/dL (ref 0.40–1.50)
GFR: 87.05 mL/min (ref >60.00)
Glucose, Bld: 130 mg/dL — ABNORMAL HIGH (ref 70–99)
Potassium: 4.5 mEq/L (ref 3.5–5.1)
Sodium: 140 mEq/L (ref 135–145)

## 2022-11-05 LAB — LIPID PANEL
Cholesterol: 114 mg/dL (ref 0–200)
HDL: 61.5 mg/dL (ref >39.00)
LDL Cholesterol: 46 mg/dL (ref 0–99)
NonHDL: 52.69
Total CHOL/HDL Ratio: 2
Triglycerides: 35 mg/dL (ref 0.0–149.0)
VLDL: 7 mg/dL (ref 0.0–40.0)

## 2022-11-05 LAB — HEPATIC FUNCTION PANEL
ALT: 21 U/L (ref 0–53)
AST: 17 U/L (ref 0–37)
Albumin: 4.1 g/dL (ref 3.5–5.2)
Alkaline Phosphatase: 107 U/L (ref 39–117)
Bilirubin, Direct: 0.1 mg/dL (ref 0.0–0.3)
Total Bilirubin: 0.5 mg/dL (ref 0.2–1.2)
Total Protein: 6.4 g/dL (ref 6.0–8.3)

## 2022-11-05 LAB — HEMOGLOBIN A1C: Hgb A1c MFr Bld: 6.3 % (ref 4.6–6.5)

## 2022-11-05 LAB — PSA, MEDICARE: PSA: 0.39 ng/ml (ref 0.10–4.00)

## 2022-11-11 ENCOUNTER — Encounter: Payer: Self-pay | Admitting: Family Medicine

## 2022-11-11 NOTE — Progress Notes (Unsigned)
Timarie Labell T. Ajane Novella, MD, CAQ Sports Medicine [facility] at [facility] [address] [place], [address]  Phone: [phone]  FAX: [phone]  COULTER GALLER - 72 y.o. male  MRN [id number]  Date of Birth: 1951-06-08  Date: 11/12/2022  PCP: Hannah Beat, MD  Referral: Hannah Beat, MD  No chief complaint on file.  Patient Care Team: Hannah Beat, MD as PCP - General Mariah Milling Tollie Pizza, MD as PCP - Cardiology (Cardiology) Subjective:   AVALON ZUBIA is a 72 y.o. pleasant patient who presents for a medicare wellness examination:  Preventative Health Maintenance Visit:  Health Maintenance Summary Reviewed and updated, unless pt declines services.  Tobacco History Reviewed. Alcohol: No concerns, no excessive use Exercise Habits: Some activity, rec at least 30 mins 5 times a week STD concerns: no risk or activity to increase risk Drug Use: None  Shingrix Covid  Health Maintenance  Topic Date Due   COVID-19 Vaccine (1) Never done   Zoster Vaccines- Shingrix (1 of 2) Never done   Medicare Annual Wellness (AWV)  11/07/2022   INFLUENZA VACCINE  01/31/2023   COLONOSCOPY (Pts 45-91yrs Insurance coverage will need to be confirmed)  11/24/2023   DTaP/Tdap/Td (2 - Td or Tdap) 04/23/2026   Pneumonia Vaccine 58+ Years old  Completed   Hepatitis C Screening  Completed   HPV VACCINES  Aged Out   Fecal DNA (Cologuard)  Discontinued    Immunization History  Administered Date(s) Administered   Influenza Whole 05/24/2009   Influenza, High Dose Seasonal PF 06/16/2018   Influenza,inj,Quad PF,6+ Mos 04/24/2017   Pneumococcal Conjugate-13 01/20/2014   Pneumococcal Polysaccharide-23 01/04/2011, 09/16/2017   Tdap 04/23/2016   Zoster, Live 01/20/2014    Patient Active Problem List   Diagnosis Date Noted   COPD (chronic obstructive pulmonary disease) with emphysema (HCC) 05/24/2009    Priority: High   Pulmonary aspergillosis (HCC) 05/13/2020    Multiple lung nodules 05/03/2020   Coronary artery calcification seen on CT scan 09/18/2017   Aortic atherosclerosis (HCC) 09/16/2017   DEPRESSION 05/24/2009   GERD 05/24/2009    Past Medical History:  Diagnosis Date   Allergic rhinitis due to pollen 07/18/2011   Asthma    as a child   COPD (chronic obstructive pulmonary disease) (HCC)    Severe, based on Spirometry.    Depression    Emphysema/COPD [facility])    Family history of colonic polyps    Family history of lung cancer    Family history of lymphoma    Family history of melanoma    Family history of skin cancer    GERD (gastroesophageal reflux disease)    on meds   History of colonic polyps 11/17/2019   Hx of adenomatous colonic polyps    Hyperlipidemia    on meds    Past Surgical History:  Procedure Laterality Date   BRONCHIAL BIOPSY  05/03/2020   Procedure: BRONCHIAL BIOPSIES;  Surgeon: Josephine Igo, DO;  Location: [facility];  Service: Pulmonary;;   BRONCHIAL BRUSHINGS  05/03/2020   Procedure: BRONCHIAL BRUSHINGS;  Surgeon: Josephine Igo, DO;  Location: [facility];  Service: Pulmonary;;   BRONCHIAL NEEDLE ASPIRATION BIOPSY  05/03/2020   Procedure: BRONCHIAL NEEDLE ASPIRATION BIOPSIES;  Surgeon: Josephine Igo, DO;  Location: [facility];  Service: Pulmonary;;   BRONCHIAL WASHINGS  05/03/2020   Procedure: BRONCHIAL WASHINGS;  Surgeon: Josephine Igo, DO;  Location: [facility];  Service: Pulmonary;;   CARDIAC CATHETERIZATION  2000  Normal coronaries   COLONOSCOPY     2018,2019,2021   COLONOSCOPY  2021   HD-MAC-mira (good after lavage)-polyps   NASAL POLYP SURGERY     1990 and 1999   POLYPECTOMY     UPPER GASTROINTESTINAL ENDOSCOPY     VIDEO BRONCHOSCOPY WITH ENDOBRONCHIAL NAVIGATION Right 05/03/2020   Procedure: VIDEO BRONCHOSCOPY WITH ENDOBRONCHIAL NAVIGATION;  Surgeon: Josephine Igo, DO;  Location: [facility];  Service: Pulmonary;  Laterality: Right;    Family History  Problem  Relation Age of Onset   Pulmonary fibrosis Mother    GER disease Mother    Hypertension Mother    Colon polyps Mother 48   Hypertension Father    Melanoma Father 4       lower back   Skin cancer Father 74       lip   Colon polyps Maternal Uncle    Alzheimer's disease Maternal Grandmother    Deep vein thrombosis Maternal Grandfather    Lymphoma Maternal Grandfather 64   Deep vein thrombosis Paternal Grandmother    Deep vein thrombosis Paternal Grandfather    Skin cancer Paternal Grandfather        lip; dx. in his 77s   Lung cancer Cousin 37       smoker (maternal cousin)   Colon cancer Neg Hx    Esophageal cancer Neg Hx    Rectal cancer Neg Hx    Stomach cancer Neg Hx     Social History   Social History Narrative   Not on file    Past Medical History, Surgical History, Social History, Family History, Problem List, Medications, and Allergies have been reviewed and updated if relevant.  Review of Systems: Pertinent positives are listed above.  Otherwise, a full 14 point review of systems has been done in full and it is negative except where it is noted positive.  Objective:   There were no vitals taken for this visit.    08/01/2020    9:48 AM 08/29/2020   10:02 AM 12/14/2020    2:55 PM 07/19/2021    1:56 PM 11/06/2021    9:05 AM  Fall Risk  Falls in the past year? 1 0 0 0 0  Was there an injury with Fall? 0 0 0 0   Fall Risk Category Calculator 1 0 0 0   Fall Risk Category (Retired) Low Low Low Low   (RETIRED) Patient Fall Risk Level Low fall risk   Low fall risk   Patient at Risk for Falls Due to    No Fall Risks   Fall risk Follow up Falls evaluation completed   Falls evaluation completed    Ideal Body Weight:   No results found.    11/06/2021    9:05 AM 07/19/2021    1:57 PM 12/14/2020    2:55 PM 12/14/2020    2:53 PM 08/29/2020   10:02 AM  Depression screen PHQ 2/9  Decreased Interest 0 0 0 0 0  Down, Depressed, Hopeless 0 0 0 0 0  PHQ - 2 Score 0 0 0 0 0      GEN: well developed, well nourished, no acute distress Eyes: conjunctiva and lids normal, PERRLA, EOMI ENT: TM clear, nares clear, oral exam WNL Neck: supple, no lymphadenopathy, no thyromegaly, no JVD Pulm: clear to auscultation and percussion, respiratory effort normal CV: regular rate and rhythm, S1-S2, no murmur, rub or gallop, no bruits, peripheral pulses normal and symmetric, no cyanosis, clubbing, edema or varicosities GI: soft,  non-tender; no hepatosplenomegaly, masses; active bowel sounds all quadrants GU: deferred Lymph: no cervical, axillary or inguinal adenopathy MSK: gait normal, muscle tone and strength WNL, no joint swelling, effusions, discoloration, crepitus  SKIN: clear, good turgor, color WNL, no rashes, lesions, or ulcerations Neuro: normal mental status, normal strength, sensation, and motion Psych: alert; oriented to person, place and time, normally interactive and not anxious or depressed in appearance.  All labs reviewed with patient.  Results for orders placed or performed in visit on 11/05/22  Hemoglobin A1c  Result Value Ref Range   Hgb A1c MFr Bld 6.3 4.6 - 6.5 %  CBC with Differential/Platelet  Result Value Ref Range   WBC 7.3 4.0 - 10.5 K/uL   RBC 5.65 4.22 - 5.81 Mil/uL   Hemoglobin 15.3 13.0 - 17.0 g/dL   HCT 16.1 09.6 - 04.5 %   MCV 83.0 78.0 - 100.0 fl   MCHC 32.6 30.0 - 36.0 g/dL   RDW 40.9 81.1 - 91.4 %   Platelets 237.0 150.0 - 400.0 K/uL   Neutrophils Relative % 86.0 Repeated and verified X2. (H) 43.0 - 77.0 %   Lymphocytes Relative 11.1 (L) 12.0 - 46.0 %   Monocytes Relative 2.3 (L) 3.0 - 12.0 %   Eosinophils Relative 0.2 0.0 - 5.0 %   Basophils Relative 0.4 0.0 - 3.0 %   Neutro Abs 6.3 1.4 - 7.7 K/uL   Lymphs Abs 0.8 0.7 - 4.0 K/uL   Monocytes Absolute 0.2 0.1 - 1.0 K/uL   Eosinophils Absolute 0.0 0.0 - 0.7 K/uL   Basophils Absolute 0.0 0.0 - 0.1 K/uL  Basic metabolic panel  Result Value Ref Range   Sodium 140 135 - 145 mEq/L    Potassium 4.5 3.5 - 5.1 mEq/L   Chloride 103 96 - 112 mEq/L   CO2 28 19 - 32 mEq/L   Glucose, Bld 130 (H) 70 - 99 mg/dL   BUN 12 6 - 23 mg/dL   Creatinine, Ser 7.82 0.40 - 1.50 mg/dL   GFR 95.62 >13.08 mL/min   Calcium 9.1 8.4 - 10.5 mg/dL  PSA, Medicare  Result Value Ref Range   PSA 0.39 0.10 - 4.00 ng/ml  Lipid panel  Result Value Ref Range   Cholesterol 114 0 - 200 mg/dL   Triglycerides 65.7 0.0 - 149.0 mg/dL   HDL 84.69 >62.95 mg/dL   VLDL 7.0 0.0 - 28.4 mg/dL   LDL Cholesterol 46 0 - 99 mg/dL   Total CHOL/HDL Ratio 2    NonHDL 52.69   Hepatic function panel  Result Value Ref Range   Total Bilirubin 0.5 0.2 - 1.2 mg/dL   Bilirubin, Direct 0.1 0.0 - 0.3 mg/dL   Alkaline Phosphatase 107 39 - 117 U/L   AST 17 0 - 37 U/L   ALT 21 0 - 53 U/L   Total Protein 6.4 6.0 - 8.3 g/dL   Albumin 4.1 3.5 - 5.2 g/dL    Assessment and Plan:     ICD-10-CM   1. Healthcare maintenance  Z00.00       Health Maintenance Exam: The patient's preventative maintenance and recommended screening tests for an annual wellness exam were reviewed in full today. Brought up to date unless services declined.  Counselled on the importance of diet, exercise, and its role in overall health and mortality. The patient's FH and SH was reviewed, including their home life, tobacco status, and drug and alcohol status.  Follow-up in 1 year for physical exam or additional  follow-up below.  I have personally reviewed the Medicare Annual Wellness questionnaire and have noted 1. The patient's medical and social history 2. Their use of alcohol, tobacco or illicit drugs 3. Their current medications and supplements 4. The patient's functional ability including ADL's, fall risks, home safety risks and hearing or visual             impairment. 5. Diet and physical activities 6. Evidence for depression or mood disorders 7. Reviewed Updated provider list, see scanned forms and [facility] Snapshot.  8. Reviewed whether  or not the patient has HCPOA or living will, and discussed what this means with the patient.  Recommended he bring in a copy for his chart in [facility].  The patients weight, height, BMI and visual acuity have been recorded in the chart I have made referrals, counseling and provided education to the patient based review of the above and I have provided the pt with a written personalized care plan for preventive services.  I have provided the patient with a copy of your personalized plan for preventive services. Instructed to take the time to review along with their updated medication list.  Disposition: No follow-ups on file.  Future Appointments  Date Time Provider Department Center  11/12/2022  9:00 AM Markeria Goetsch, Karleen Hampshire, MD [facility]    No orders of the defined types were placed in this encounter.  There are no discontinued medications. No orders of the defined types were placed in this encounter.   Signed,  Elpidio Galea. Karinda Cabriales, MD   Allergies as of 11/12/2022       Reactions   Penicillins Rash   Reaction: 50 Years ago        Medication List        Accurate as of Nov 11, 2022  4:35 PM. If you have any questions, ask your nurse or doctor.          aspirin EC 81 MG tablet Take 81 mg by mouth daily. Swallow whole.   atorvastatin 10 MG tablet Commonly known as: LIPITOR TAKE ONE TABLET BY MOUTH ONCE DAILY   Cal-Mag-Zinc-D Tabs Take 500 mg by mouth at bedtime.   ECHINACEA EXTRACT PO Take 1,520 mg by mouth at bedtime. 760 mg each   L-Arginine 500 MG Caps Take 500 mg by mouth at bedtime.   L-Carnitine 500 MG Caps Take 500 mg by mouth at bedtime.   multivitamin with minerals tablet Take 1 tablet by mouth daily. Centrum Silver men 50+   omeprazole 20 MG capsule Commonly known as: PRILOSEC TAKE 2 CAPSULES BY MOUTH DAILY   Proventil HFA 108 (90 Base) MCG/ACT inhaler Generic drug: albuterol INHALE TWO PUFFS BY MOUTH EVERY 6 HOURS AS NEEDED FOR WHEEZING OR   SHORTNESS  OF  BREATH What changed: See the new instructions.   triamcinolone cream 0.1 % Commonly known as: KENALOG Apply topically 2 (two) times daily.   Turmeric Curcumin 500 MG Caps Take 500 mg by mouth at bedtime.

## 2022-11-12 ENCOUNTER — Ambulatory Visit (INDEPENDENT_AMBULATORY_CARE_PROVIDER_SITE_OTHER): Payer: Medicare Other | Admitting: Family Medicine

## 2022-11-12 ENCOUNTER — Encounter: Payer: Self-pay | Admitting: Family Medicine

## 2022-11-12 VITALS — BP 130/80 | HR 78 | Temp 98.4°F | Ht 66.0 in | Wt 187.5 lb

## 2022-11-12 DIAGNOSIS — Z0000 Encounter for general adult medical examination without abnormal findings: Secondary | ICD-10-CM | POA: Diagnosis not present

## 2022-11-12 DIAGNOSIS — R7303 Prediabetes: Secondary | ICD-10-CM | POA: Diagnosis not present

## 2022-11-13 ENCOUNTER — Encounter: Payer: Self-pay | Admitting: Family Medicine

## 2022-11-13 DIAGNOSIS — R7303 Prediabetes: Secondary | ICD-10-CM | POA: Insufficient documentation

## 2022-11-15 ENCOUNTER — Other Ambulatory Visit: Payer: Self-pay | Admitting: Pulmonary Disease

## 2022-11-21 DIAGNOSIS — H524 Presbyopia: Secondary | ICD-10-CM | POA: Diagnosis not present

## 2022-12-14 ENCOUNTER — Other Ambulatory Visit: Payer: Self-pay | Admitting: Pulmonary Disease

## 2022-12-17 ENCOUNTER — Encounter: Payer: Self-pay | Admitting: Family Medicine

## 2022-12-17 ENCOUNTER — Encounter: Payer: Self-pay | Admitting: Pulmonary Disease

## 2022-12-17 ENCOUNTER — Telehealth: Payer: Self-pay | Admitting: Pulmonary Disease

## 2022-12-17 NOTE — Telephone Encounter (Signed)
PT sends [facility] message that he needs more Trelegy. Wonders why we did not contact him because his RX expired. Last AVS notes state:  We will get a follow-up CT in 6 months to make sure there is no changes  Return to clinic in 6 months after CT scan   No order in for CT so please place order.   I will call to set appt  Please see if Dr. Isaiah Serge will extend his Breztri.  Pharm is [facility]

## 2022-12-17 NOTE — Telephone Encounter (Signed)
Please send in for 2  Trelegy since his appt is not until mid- August.  Also, PT wants CT scan done at [facility]  IGNORE LAST  message:   "Please see if Dr. Isaiah Serge will extend his Markus Daft."   He wants Trellegy 2 mo called in.

## 2022-12-17 NOTE — Telephone Encounter (Signed)
Pt. Calling again to get more refills to hold him till apt with Dr. And neef CT order place so they will call to sched.

## 2022-12-18 ENCOUNTER — Other Ambulatory Visit: Payer: Self-pay | Admitting: Family Medicine

## 2022-12-18 MED ORDER — TRELEGY ELLIPTA 100-62.5-25 MCG/ACT IN AEPB: 1.0000 | INHALATION_SPRAY | Freq: Every day | RESPIRATORY_TRACT | 2 refills | Status: DC

## 2022-12-19 NOTE — Telephone Encounter (Signed)
Received the following message from patient:   "Dr. Isaiah Serge I inhaled the last dose from my Trelegy Ellipta this morning.  I was planning on being out of town until Monday, 12/24/2022.  I have made an appointment with you for 02/13/2023.  I spoke with "Tennesa" this morning.  She scheduled the appointment and assured me she would get 2-30 days of Trelegy Ellipta refilled so I would have enough to last me until our appointment.  She also said she would arrange for me to get a CT scan prior to our appointment 02/13/2023.  I asked to have the CT scan done at [facility] or their satellite office off of [address]. I called my pharmacy around 12:30pm, and they had not received a refill authorization from you.  I really need this prescription refilled today, 12/17/2022.  Please send a MyChart notification to me when the refill is sent. Thank you, Richard Grimes [birth year]"  He last had a high res CT on 02/14/22. I did not see where a repeat CT was needed. Dr. Isaiah Serge, can you please advise? Thanks!

## 2022-12-19 NOTE — Telephone Encounter (Signed)
See see pt advice request dated 12/17/22 This is a duplicate encounter

## 2022-12-19 NOTE — Telephone Encounter (Signed)
Okay to send the Trelegy refill if it has not already been done No need for a repeat CT before the follow-up visit as his last CT scan showed resolution of the larger lung nodule while the other nodules are also stable.

## 2022-12-24 DIAGNOSIS — J449 Chronic obstructive pulmonary disease, unspecified: Secondary | ICD-10-CM | POA: Diagnosis not present

## 2023-01-09 ENCOUNTER — Other Ambulatory Visit: Payer: Self-pay | Admitting: Family Medicine

## 2023-01-09 DIAGNOSIS — D508 Other iron deficiency anemias: Secondary | ICD-10-CM

## 2023-02-08 ENCOUNTER — Telehealth: Payer: Self-pay | Admitting: Pulmonary Disease

## 2023-02-08 NOTE — Telephone Encounter (Signed)
Also asking if Dr. Does want CT can he get it done in [place] on [address]

## 2023-02-08 NOTE — Telephone Encounter (Signed)
Pt. Called to change apt. Till the end of Oct. But is asking if Dr. Isaiah Serge till wants him to have another CT done if so needs new order place please advise pt.

## 2023-02-08 NOTE — Telephone Encounter (Signed)
Dr. Isaiah Serge- Please advise  Patient is asking if he needs HRCT before his next OV. Patient is scheduled 04/29/23 and his last HRCT was 02/14/22.

## 2023-02-11 NOTE — Telephone Encounter (Signed)
No need for a repeat CT before the follow-up visit as his last CT scan showed resolution of the larger lung nodule while the other nodules are also stable.

## 2023-02-13 ENCOUNTER — Ambulatory Visit: Payer: Medicare Other | Admitting: Pulmonary Disease

## 2023-02-15 ENCOUNTER — Encounter: Payer: Self-pay | Admitting: Pulmonary Disease

## 2023-03-18 ENCOUNTER — Encounter: Payer: Self-pay | Admitting: Family Medicine

## 2023-03-18 ENCOUNTER — Other Ambulatory Visit: Payer: Self-pay | Admitting: Family Medicine

## 2023-04-29 ENCOUNTER — Encounter: Payer: Self-pay | Admitting: Pulmonary Disease

## 2023-04-29 ENCOUNTER — Ambulatory Visit: Payer: Medicare Other | Admitting: Pulmonary Disease

## 2023-04-29 VITALS — BP 132/82 | HR 75 | Ht 66.0 in | Wt 188.0 lb

## 2023-04-29 DIAGNOSIS — J4489 Other specified chronic obstructive pulmonary disease: Secondary | ICD-10-CM | POA: Diagnosis not present

## 2023-04-29 DIAGNOSIS — J439 Emphysema, unspecified: Secondary | ICD-10-CM

## 2023-04-29 DIAGNOSIS — B449 Aspergillosis, unspecified: Secondary | ICD-10-CM | POA: Diagnosis not present

## 2023-04-29 DIAGNOSIS — R918 Other nonspecific abnormal finding of lung field: Secondary | ICD-10-CM | POA: Diagnosis not present

## 2023-04-29 NOTE — Patient Instructions (Signed)
VISIT SUMMARY:  During your visit today, we reviewed your history of COPD, lung nodule, and past aspergillosis treatment. You reported good control of your respiratory symptoms with minimal use of albuterol and daily use of Trelegy. Your recent CT scan showed no concerning changes in your lung nodule, and there is no current evidence of aspergillosis recurrence.  YOUR PLAN:  -CHRONIC OBSTRUCTIVE PULMONARY DISEASE (COPD): COPD is a chronic lung condition that makes it hard to breathe. Your condition is stable with minimal use of albuterol, and Trelegy is working well for you. Please continue using Trelegy daily.  -LUNG NODULE: A lung nodule is a small growth in the lung. Your recent CT scan in August 2023 showed no concerning changes. We will order a follow-up CT scan in 6 months (April 2025) to monitor it.  -ASPERGILLOSIS: Aspergillosis is an infection caused by a type of mold. You were treated for this in 2022, and there is no current evidence of recurrence. No further action is required at this time.  INSTRUCTIONS:  Please follow up in 6 months or sooner if you develop any new symptoms. If you do not hear from our office regarding the CT scan in 6 months, please call us.

## 2023-04-29 NOTE — Progress Notes (Unsigned)
Richard Grimes    [id number]    Mar 04, 1951  Primary Care Physician:Copland, Karleen Hampshire, MD  Referring Physician: Hannah Beat, MD [address] [place],  [place] [address]  Problem list: Follow-up for  Lung nodule, COPD Post Covid invasive aspergillosis, diagnosed with a bronchoscopy, s/p voriconazle treatment in 6550  HPI: 72 year old with history of asthma as a child, COPD.  Diagnosed with COPD in 2010 with mild symptoms of dyspnea on exertion.  He has productive cough with white mucus, denies any fevers, chills.  He was currently maintained on Dulera and Spiriva with about 1 exacerbation every year.  Switch to Trelegy in 2019 with improvement in symptoms He has seen Dr. Jacinto Halim, cardiologist for CT findings of coronary atherosclerosis.  Had COVID-19 infection in September 2021 treated with monoclonal antibody and 3 weeks of steroids Developed lung infiltrates with cavitation post Covid.  Underwent bronchoscopy on 05/03/2020 by Dr. Tonia Brooms with findings of invasive aspergillosis. Underwent 16 weeks of antifungal therapy with voriconazole was finished in March 2022.  His follow-up serum Aspergillus antigen has been undetectable and CT is stable.  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  Previous exposure to chemicals including fumes, polymers.  Denies any asbestos, Holiday representative exposure.  No mold exposure, no hot tubs. Smoking history: 70-pack-year smoking history.  Quit in 2004. Travel History: No recent travel.  Interim history: Discussed the use of AI scribe software for clinical note transcription with the patient, who gave verbal consent to proceed.  The patient, with a history of COPD, presents for a routine follow-up. He reports good control of his respiratory symptoms, rarely requiring albuterol and using Trelegy daily. He had previously tried  [place], but discontinued it due to perceived lack of efficacy and possible worsening of symptoms. He continues to work as usual and his blood pressure remains consistent.  The patient also has a history of a lung nodule, which has been followed with annual CT scans. The most recent scan in August 2023 showed no concerning changes. He quit smoking in 2004.  In 2022, the patient was treated for aspergillosis, which has since resolved. He also had a severe bout of COVID-19, during which he continued to work despite feeling very unwell. At that time, a chest x-ray suggested possible lung cancer, but subsequent imaging and bronchoscopy confirmed the diagnosis of aspergillosis instead.   Outpatient Encounter Medications as of 04/29/2023  Medication Sig   aspirin EC 81 MG tablet Take 81 mg by mouth daily. Swallow whole.   atorvastatin (LIPITOR) 10 MG tablet TAKE ONE TABLET BY MOUTH ONCE DAILY   ECHINACEA EXTRACT PO Take 1,520 mg by mouth at bedtime. 760 mg each   L-Arginine 500 MG CAPS Take 500 mg by mouth at bedtime.   levOCARNitine L-Tartrate (L-CARNITINE) 500 MG CAPS Take 500 mg by mouth at bedtime.   Multiple Minerals-Vitamins (CAL-MAG-ZINC-D) TABS Take 500 mg by mouth at bedtime.   Multiple Vitamins-Minerals (MULTIVITAMIN WITH MINERALS) tablet Take 1 tablet by mouth daily. Centrum Silver men 50+   omeprazole (PRILOSEC) 20 MG capsule TAKE TWO CAPSULES BY MOUTH DAILY   PROVENTIL HFA 108 (90 Base) MCG/ACT inhaler INHALE TWO PUFFS BY MOUTH EVERY 6 HOURS AS NEEDED FOR WHEEZING OR  SHORTNESS  OF  BREATH   TRELEGY ELLIPTA 100-62.5-25 MCG/ACT AEPB INHALE ONE PUFF INTO THE LUNGS DAILY.   triamcinolone  cream (KENALOG) 0.1 % Apply topically 2 (two) times daily.   Turmeric Curcumin 500 MG CAPS Take 500 mg by mouth at bedtime.   No facility-administered encounter medications on file as of 04/29/2023.   Physical Exam: Blood pressure 132/82, pulse 75, height 5\' 6"  (1.676 m), weight 188 lb (85.3 kg), SpO2  99%. Gen:      No acute distress HEENT:  EOMI, sclera anicteric Neck:     No masses; no thyromegaly Lungs:    Clear to auscultation bilaterally; normal respiratory effort CV:         Regular rate and rhythm; no murmurs Abd:      + bowel sounds; soft, non-tender; no palpable masses, no distension Ext:    No edema; adequate peripheral perfusion Skin:      Warm and dry; no rash Neuro: alert and oriented x 3 Psych: normal mood and affect   Data Reviewed: Imaging High-resolution CT 09/04/17-nonspecific basal reticulation.  Aortic, coronary atherosclerosis.  Mild to moderate emphysema.  7 mm right lower lobe nodule. CT chest 04/22/2018-left upper lobe nodule is enlarged to 8 mm, several new pulmonary nodules in both lungs Moderate emphysema, two-vessel coronary atherosclerosis. PET scan 05/01/2018- FDG uptake in the lung nodules.  Faint stranding in the mesentery. CT chest 08/18/2018- decrease in size and number of previously noted pulmonary nodule.  The left upper lobe nodule has decreased to 6 mm. CT chest 08/21/2019-new 5 mm left upper lobe nodule.  Other subcentimeter pulmonary nodules remain stable CT chest 04/21/2020-bilateral nodular opacities with cavitation in the left upper lobe, multiple subcentimeter mediastinal lymph nodes. PET scan 04/26/2020-multifocal FDG avid pulmonary nodules.  No evidence of uptake elsewhere CT chest 08/07/2021-mild to moderate emphysema, stable/or resolved bilateral lung nodules.  New 6 mm right upper lobe focal scar. I have reviewed the images personally.  PFTs 09/23/17 FVC 3.74 [89%], FEV1 1.97 [63%], F/F 53, TLC 108%, DLCO 55% Moderate obstruction and diffusion defect  Biodesix Nodify XL 2 April 28, 2018 Pretest probability 16% risk of malignancy Test result- reduced risk of malignancy.  3% risk of malignancy  Labs: Bronchoscopy 05/03/2020 Fungal cultures Aspergillus terrens BAL Aspergillus antigen positive Hyphal elements seen on  pathology.  Assessment:  Post Covid invasive aspergillosis Developed invasive aspergillosis after COVID-19 infection.  This has been reported in the literature and probably precipitated by steroid use. He has finished voriconazole therapy and is followed by ID Follow up CT does not show any residual disease  Moderate COPD Advised to work on diet, weight loss, exercise We discussed pulmonary rehab however he wants to try an exercise program at home by himself first.  He will call us in case he wants a referral to a rehab program at [facility]. Stable, with minimal use of albuterol. Trelegy is effective and preferred over [place]. -Continue Trelegy daily.  Pulmonary nodule Stable on previous CT scan in August 2023. No new symptoms suggestive of malignancy. -Order follow-up CT scan in 6 months (April 2025).  Follow-up In 6 months or sooner if any new symptoms develop. If no contact from the office regarding the CT scan in 6 months, patient to call the office.   Plan/Recommendations: - Continue trelegy - CT scan without contrast in 6 months.  Chilton Greathouse MD [facility] 04/29/2023, 11:06 AM  CC: Hannah Beat, MD

## 2023-05-01 ENCOUNTER — Inpatient Hospital Stay: Admission: RE | Admit: 2023-05-01 | Payer: Medicare Other | Source: Ambulatory Visit

## 2023-05-10 DIAGNOSIS — L304 Erythema intertrigo: Secondary | ICD-10-CM | POA: Diagnosis not present

## 2023-06-17 ENCOUNTER — Other Ambulatory Visit: Payer: Self-pay | Admitting: Family Medicine

## 2023-07-16 ENCOUNTER — Other Ambulatory Visit: Payer: Self-pay | Admitting: Family Medicine

## 2023-07-16 NOTE — Telephone Encounter (Signed)
 Last office visit 11/12/2022 for MWV.  Last refilled 07/19/22 for 454 g with no refills.  Next Appt: No future appointments with PCP.

## 2023-07-29 NOTE — Progress Notes (Unsigned)
Cardiology Office Note  Date:  07/30/2023   ID:  Richard Grimes, DOB [birth year], MRN [id number]  PCP:  Hannah Beat, MD   Chief Complaint  Patient presents with   12 month follow up     "Doing well."     HPI:  Richard Grimes is a 73 year old gentleman with past medical history of Smoking , quit 2004 Centrolobular emphysema, Aortic calcification, seen on CT scan Who presents for f/u of his aortic atherosclerosis, coronary calcification  Last seen in clinic by myself May 2023  Active at baseline Lays plastic for gardens, [employer], lots of travel More sedentary in the winter  Weight up 6 pounds over the past year  Denies chest pain concerning for angina on exertion  Labs reviewed Total chol 114, LDL 46 A1C 6.3  CT chest February 2023  Coronary calcium , mild in LAD and LCX, Mild aortic athero  On inhalers, better on Trelegy Feels his breathing is stable on the inhaler  EKG personally reviewed by myself on todays visit EKG Interpretation Date/Time:  Tuesday July 30 2023 11:45:03 EST Ventricular Rate:  68 PR Interval:  162 QRS Duration:  132 QT Interval:  412 QTC Calculation: 438 R Axis:   39  Text Interpretation: Normal sinus rhythm Right bundle branch block When compared with ECG of 05-May-2009 04:32, Right bundle branch block is now Present Confirmed by Julien Nordmann [phone]) on 07/30/2023 12:05:01 PM     PMH:   has a past medical history of Allergic rhinitis due to pollen (07/18/2011), Asthma, COPD (chronic obstructive pulmonary disease) (HCC), Depression, Emphysema/COPD (HCC), GERD (gastroesophageal reflux disease), adenomatous colonic polyps, and Hyperlipidemia.  PSH:    Past Surgical History:  Procedure Laterality Date   BRONCHIAL BIOPSY  05/03/2020   Procedure: BRONCHIAL BIOPSIES;  Surgeon: Josephine Igo, DO;  Location: [facility];  Service: Pulmonary;;   BRONCHIAL BRUSHINGS  05/03/2020   Procedure: BRONCHIAL BRUSHINGS;  Surgeon:  Josephine Igo, DO;  Location: [facility];  Service: Pulmonary;;   BRONCHIAL NEEDLE ASPIRATION BIOPSY  05/03/2020   Procedure: BRONCHIAL NEEDLE ASPIRATION BIOPSIES;  Surgeon: Josephine Igo, DO;  Location: [facility];  Service: Pulmonary;;   BRONCHIAL WASHINGS  05/03/2020   Procedure: BRONCHIAL WASHINGS;  Surgeon: Josephine Igo, DO;  Location: [facility];  Service: Pulmonary;;   CARDIAC CATHETERIZATION  2000   Normal coronaries   COLONOSCOPY     2018,2019,2021   COLONOSCOPY  2021   HD-MAC-mira (good after lavage)-polyps   NASAL POLYP SURGERY     1990 and 1999   POLYPECTOMY     UPPER GASTROINTESTINAL ENDOSCOPY     VIDEO BRONCHOSCOPY WITH ENDOBRONCHIAL NAVIGATION Right 05/03/2020   Procedure: VIDEO BRONCHOSCOPY WITH ENDOBRONCHIAL NAVIGATION;  Surgeon: Josephine Igo, DO;  Location: [facility];  Service: Pulmonary;  Laterality: Right;    Current Outpatient Medications  Medication Sig Dispense Refill   aspirin EC 81 MG tablet Take 81 mg by mouth daily. Swallow whole.     atorvastatin (LIPITOR) 10 MG tablet TAKE ONE TABLET BY MOUTH ONCE DAILY 90 tablet 3   ECHINACEA EXTRACT PO Take 1,520 mg by mouth at bedtime. 760 mg each     fluticasone (CUTIVATE) 0.05 % cream Apply 1 Application topically daily.     ketoconazole (NIZORAL) 2 % cream Apply 1 Application topically daily as needed.     L-Arginine 500 MG CAPS Take 500 mg by mouth at bedtime.     levOCARNitine L-Tartrate (L-CARNITINE) 500 MG CAPS Take 500  mg by mouth at bedtime.     Multiple Minerals-Vitamins (CAL-MAG-ZINC-D) TABS Take 500 mg by mouth at bedtime.     Multiple Vitamins-Minerals (MULTIVITAMIN WITH MINERALS) tablet Take 1 tablet by mouth daily. Centrum Silver men 50+     omeprazole (PRILOSEC) 20 MG capsule TAKE TWO CAPSULES BY MOUTH DAILY 180 capsule 3   PROVENTIL HFA 108 (90 Base) MCG/ACT inhaler INHALE TWO PUFFS BY MOUTH EVERY 6 HOURS AS NEEDED FOR WHEEZING OR  SHORTNESS  OF  BREATH 7 each 3   TRELEGY ELLIPTA  100-62.5-25 MCG/ACT AEPB INHALE ONE PUFF INTO THE LUNGS DAILY. 60 each 2   triamcinolone cream (KENALOG) 0.1 % APPLY TOPICALLY TO AFFECTED AREA TWICE A DAY 454 g 1   Turmeric Curcumin 500 MG CAPS Take 500 mg by mouth at bedtime.     No current facility-administered medications for this visit.     Allergies:   Penicillins   Social History:  The patient  reports that he quit smoking about 19 years ago. His smoking use included cigarettes. He started smoking about 54 years ago. He has a 70 pack-year smoking history. He has been exposed to tobacco smoke. He has never used smokeless tobacco. He reports that he does not currently use alcohol. He reports that he does not use drugs.   Family History:   family history includes Alzheimer's disease in his maternal grandmother; Colon polyps in his maternal uncle; Colon polyps (age of onset: 19) in his mother; Deep vein thrombosis in his maternal grandfather, paternal grandfather, and paternal grandmother; GER disease in his mother; Hypertension in his father and mother; Lung cancer (age of onset: 82) in his cousin; Lymphoma (age of onset: 15) in his maternal grandfather; Melanoma (age of onset: 71) in his father; Pulmonary fibrosis in his mother; Skin cancer in his paternal grandfather; Skin cancer (age of onset: 100) in his father.    Review of Systems: Review of Systems  Constitutional: Negative.   Respiratory:  Positive for shortness of breath.   Cardiovascular: Negative.   Gastrointestinal: Negative.   Musculoskeletal: Negative.   Neurological: Negative.   Psychiatric/Behavioral: Negative.    All other systems reviewed and are negative.    PHYSICAL EXAM: VS:  BP 130/86 (BP Location: Left Arm, Patient Position: Sitting, Cuff Size: Normal)   Pulse 68   Ht 5\' 6"  (1.676 m)   Wt 193 lb 8 oz (87.8 kg)   SpO2 95%   BMI 31.23 kg/m  , BMI Body mass index is 31.23 kg/m. Constitutional:  oriented to person, place, and time. No distress.  HENT:   Head: Grossly normal Eyes:  no discharge. No scleral icterus.  Neck: No JVD, no carotid bruits  Cardiovascular: Regular rate and rhythm, no murmurs appreciated Pulmonary/Chest: Clear to auscultation bilaterally, no wheezes or rails Abdominal: Soft.  no distension.  no tenderness.  Musculoskeletal: Normal range of motion Neurological:  normal muscle tone. Coordination normal. No atrophy Skin: Skin warm and dry Psychiatric: normal affect, pleasant  Recent Labs: 11/05/2022: ALT 21; BUN 12; Creatinine, Ser 0.89; Hemoglobin 15.3; Platelets 237.0; Potassium 4.5; Sodium 140    Lipid Panel Lab Results  Component Value Date   CHOL 114 11/05/2022   HDL 61.50 11/05/2022   LDLCALC 46 11/05/2022   TRIG 35.0 11/05/2022      Wt Readings from Last 3 Encounters:  07/30/23 193 lb 8 oz (87.8 kg)  04/29/23 188 lb (85.3 kg)  11/12/22 187 lb 8 oz (85 kg)     ASSESSMENT  AND PLAN:  Aortic atherosclerosis (HCC) Mild aortic atherosclerosis noted on CT scan in the arch Reports compliance with his Lipitor Goal LDL less than 70 preferably less than 55 On aspirin every other day  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  Panlobular emphysema (HCC) Long history of smoking, has chronic shortness of breath on Trelegy Reports symptoms are stable, currently not smoking  Coronary artery calcification seen on CT scan - mild in the LAD,  mid circumflex Asa every other day Denies anginal symptoms  Smoker  stopped many years ago Underlying emphysema on CT scan   Orders Placed This Encounter  Procedures   EKG 12-Lead     Signed, Dossie Arbour, M.D., Ph.D. 07/30/2023  [facility] [place], [place] [phone]

## 2023-07-30 ENCOUNTER — Encounter: Payer: Self-pay | Admitting: Cardiovascular Disease

## 2023-07-30 ENCOUNTER — Ambulatory Visit: Payer: Medicare Other | Attending: Cardiovascular Disease | Admitting: Cardiovascular Disease

## 2023-07-30 VITALS — BP 130/86 | HR 68 | Ht 66.0 in | Wt 193.5 lb

## 2023-07-30 DIAGNOSIS — Z87891 Personal history of nicotine dependence: Secondary | ICD-10-CM

## 2023-07-30 DIAGNOSIS — I700 Atherosclerosis of aorta: Secondary | ICD-10-CM

## 2023-07-30 DIAGNOSIS — I2510 Atherosclerotic heart disease of native coronary artery without angina pectoris: Secondary | ICD-10-CM

## 2023-07-30 DIAGNOSIS — R7303 Prediabetes: Secondary | ICD-10-CM | POA: Diagnosis not present

## 2023-07-30 DIAGNOSIS — J431 Panlobular emphysema: Secondary | ICD-10-CM

## 2023-07-30 NOTE — Patient Instructions (Signed)
Medication Instructions:  No changes  If you need a refill on your cardiac medications before your next appointment, please call your pharmacy.   Lab work: No new labs needed  Testing/Procedures: No new testing needed  Follow-Up: At [facility], you and your health needs are our priority.  As part of our continuing mission to provide you with exceptional heart care, we have created designated Provider Care Teams.  These Care Teams include your primary Cardiologist (physician) and Advanced Practice Providers (APPs -  Physician Assistants and Nurse Practitioners) who all work together to provide you with the care you need, when you need it.  You will need a follow up appointment in 12 months  Providers on your designated Care Team:   Nicolasa Ducking, NP Eula Listen, PA-C Cadence Fransico Michael, [place]  COVID-19 Vaccine Information can be found at: [email or web address] For questions related to vaccine distribution or appointments, please email [email or web address] or call [phone].

## 2023-08-30 ENCOUNTER — Other Ambulatory Visit: Payer: Self-pay | Admitting: Family Medicine

## 2023-08-30 ENCOUNTER — Ambulatory Visit: Payer: Medicare Other

## 2023-08-30 VITALS — Ht 66.0 in | Wt 193.0 lb

## 2023-08-30 DIAGNOSIS — Z0000 Encounter for general adult medical examination without abnormal findings: Secondary | ICD-10-CM

## 2023-08-30 DIAGNOSIS — Z1211 Encounter for screening for malignant neoplasm of colon: Secondary | ICD-10-CM

## 2023-08-30 DIAGNOSIS — R7303 Prediabetes: Secondary | ICD-10-CM

## 2023-08-30 DIAGNOSIS — Z125 Encounter for screening for malignant neoplasm of prostate: Secondary | ICD-10-CM

## 2023-08-30 DIAGNOSIS — Z79899 Other long term (current) drug therapy: Secondary | ICD-10-CM

## 2023-08-30 DIAGNOSIS — E785 Hyperlipidemia, unspecified: Secondary | ICD-10-CM

## 2023-08-30 NOTE — Progress Notes (Signed)
 Subjective:   Richard Grimes is a 73 y.o. who presents for a Medicare Wellness preventive visit.  Visit Complete: Virtual I connected with  Richard Grimes on 08/30/23 by a audio enabled telemedicine application and verified that I am speaking with the correct person using two identifiers.  Patient Location: Home  Provider Location: Office/Clinic  I discussed the limitations of evaluation and management by telemedicine. The patient expressed understanding and agreed to proceed.  Vital Signs: Because this visit was a virtual/telehealth visit, some criteria may be missing or patient reported. Any vitals not documented were not able to be obtained and vitals that have been documented are patient reported.  VideoDeclined- This patient declined Librarian, academic. Therefore the visit was completed with audio only.  AWV Questionnaire: No: Patient Medicare AWV questionnaire was not completed prior to this visit.  Cardiac Risk Factors include: advanced age (>63men, >38 women);obesity (BMI >30kg/m2);male gender     Objective:    Today's Vitals   08/30/23 0844  Weight: 193 lb (87.5 kg)  Height: 5\' 6"  (1.676 m)   Body mass index is 31.15 kg/m.     08/30/2023    8:57 AM 05/03/2020    1:42 PM 08/19/2019    9:42 AM  Advanced Directives  Does Patient Have a Medical Advance Directive? No No No  Would patient like information on creating a medical advance directive?  No - Patient declined No - Patient declined    Current Medications (verified) Outpatient Encounter Medications as of 08/30/2023  Medication Sig   aspirin EC 81 MG tablet Take 81 mg by mouth daily. Swallow whole.   atorvastatin (LIPITOR) 10 MG tablet TAKE ONE TABLET BY MOUTH ONCE DAILY   ECHINACEA EXTRACT PO Take 1,520 mg by mouth at bedtime. 760 mg each   fluticasone (CUTIVATE) 0.05 % cream Apply 1 Application topically daily.   ketoconazole (NIZORAL) 2 % cream Apply 1 Application topically daily as  needed.   L-Arginine 500 MG CAPS Take 500 mg by mouth at bedtime.   levOCARNitine L-Tartrate (L-CARNITINE) 500 MG CAPS Take 500 mg by mouth at bedtime.   Multiple Minerals-Vitamins (CAL-MAG-ZINC-D) TABS Take 500 mg by mouth at bedtime.   Multiple Vitamins-Minerals (MULTIVITAMIN WITH MINERALS) tablet Take 1 tablet by mouth daily. Centrum Silver men 50+   omeprazole (PRILOSEC) 20 MG capsule TAKE TWO CAPSULES BY MOUTH DAILY   PROVENTIL HFA 108 (90 Base) MCG/ACT inhaler INHALE TWO PUFFS BY MOUTH EVERY 6 HOURS AS NEEDED FOR WHEEZING OR  SHORTNESS  OF  BREATH   TRELEGY ELLIPTA 100-62.5-25 MCG/ACT AEPB INHALE ONE PUFF INTO THE LUNGS DAILY.   triamcinolone cream (KENALOG) 0.1 % APPLY TOPICALLY TO AFFECTED AREA TWICE A DAY   Turmeric Curcumin 500 MG CAPS Take 500 mg by mouth at bedtime.   No facility-administered encounter medications on file as of 08/30/2023.    Allergies (verified) Penicillins   History: Past Medical History:  Diagnosis Date   Allergic rhinitis due to pollen 07/18/2011   Asthma    as a child   COPD (chronic obstructive pulmonary disease) (HCC)    Severe, based on Spirometry.    Depression    Emphysema/COPD (HCC)    GERD (gastroesophageal reflux disease)    Hx of adenomatous colonic polyps    Hyperlipidemia    Past Surgical History:  Procedure Laterality Date   BRONCHIAL BIOPSY  05/03/2020   Procedure: BRONCHIAL BIOPSIES;  Surgeon: Josephine Igo, DO;  Location: [facility];  Service: Pulmonary;;  BRONCHIAL BRUSHINGS  05/03/2020   Procedure: BRONCHIAL BRUSHINGS;  Surgeon: Josephine Igo, DO;  Location: [facility];  Service: Pulmonary;;   BRONCHIAL NEEDLE ASPIRATION BIOPSY  05/03/2020   Procedure: BRONCHIAL NEEDLE ASPIRATION BIOPSIES;  Surgeon: Josephine Igo, DO;  Location: [facility];  Service: Pulmonary;;   BRONCHIAL WASHINGS  05/03/2020   Procedure: BRONCHIAL WASHINGS;  Surgeon: Josephine Igo, DO;  Location: [facility];  Service: Pulmonary;;    CARDIAC CATHETERIZATION  2000   Normal coronaries   COLONOSCOPY     2018,2019,2021   COLONOSCOPY  2021   HD-MAC-mira (good after lavage)-polyps   NASAL POLYP SURGERY     1990 and 1999   POLYPECTOMY     UPPER GASTROINTESTINAL ENDOSCOPY     VIDEO BRONCHOSCOPY WITH ENDOBRONCHIAL NAVIGATION Right 05/03/2020   Procedure: VIDEO BRONCHOSCOPY WITH ENDOBRONCHIAL NAVIGATION;  Surgeon: Josephine Igo, DO;  Location: [facility];  Service: Pulmonary;  Laterality: Right;   Family History  Problem Relation Age of Onset   Pulmonary fibrosis Mother    GER disease Mother    Hypertension Mother    Colon polyps Mother 91   Hypertension Father    Melanoma Father 9       lower back   Skin cancer Father 23       lip   Colon polyps Maternal Uncle    Alzheimer's disease Maternal Grandmother    Deep vein thrombosis Maternal Grandfather    Lymphoma Maternal Grandfather 64   Deep vein thrombosis Paternal Grandmother    Deep vein thrombosis Paternal Grandfather    Skin cancer Paternal Grandfather        lip; dx. in his 44s   Lung cancer Cousin 61       smoker (maternal cousin)   Colon cancer Neg Hx    Esophageal cancer Neg Hx    Rectal cancer Neg Hx    Stomach cancer Neg Hx    Social History   Socioeconomic History   Marital status: Married    Spouse name: Richard Grimes   Number of children: Not on file   Years of education: Not on file   Highest education level: Not on file  Occupational History   Occupation: fumigation    Employer: [employer]  Tobacco Use   Smoking status: Former    Current packs/day: 0.00    Average packs/day: 2.0 packs/day for 35.0 years (70.0 ttl pk-yrs)    Types: Cigarettes    Start date: 07/02/1969    Quit date: 07/02/2004    Years since quitting: 19.1    Passive exposure: Current   Smokeless tobacco: Never   Tobacco comments:    Nicotine gum   Vaping Use   Vaping status: Never Used  Substance and Sexual Activity   Alcohol use: Not Currently     Alcohol/week: 0.0 - 3.0 standard drinks of alcohol    Comment: occ glass on wine, or beer   Drug use: No   Sexual activity: Yes    Partners: Female  Other Topics Concern   Not on file  Social History Narrative   Not on file   Social Drivers of Health   Financial Resource Strain: Low Risk  (08/19/2019)   Overall Financial Resource Strain (CARDIA)    Difficulty of Paying Living Expenses: Not hard at all  Food Insecurity: No Food Insecurity (08/19/2019)   Hunger Vital Sign    Worried About Running Out of Food in the Last Year: Never true    Ran Out  of Food in the Last Year: Never true  Transportation Needs: No Transportation Needs (08/19/2019)   PRAPARE - Administrator, Civil Service (Medical): No    Lack of Transportation (Non-Medical): No  Physical Activity: Inactive (08/30/2023)   Exercise Vital Sign    Days of Exercise per Week: 0 days    Minutes of Exercise per Session: 0 min  Stress: No Stress Concern Present (08/30/2023)   [employer] of Occupational Health - Occupational Stress Questionnaire    Feeling of Stress : Not at all  Social Connections: Not on file    Tobacco Counseling Counseling given: Not Answered Tobacco comments: Nicotine gum   Clinical Intake:  Pre-visit preparation completed: Yes  Pain : No/denies pain    BMI - recorded: 31.15 Nutritional Status: BMI > 30  Obese Nutritional Risks: None Diabetes: No  How often do you need to have someone help you when you read instructions, pamphlets, or other written materials from your doctor or pharmacy?: 1 - Never  Interpreter Needed?: No  Comments: lives w/wife Information entered by :: B.Rayn Shorb,LPN   Activities of Daily Living     08/30/2023    8:58 AM  In your present state of health, do you have any difficulty performing the following activities:  Hearing? 0  Vision? 0  Difficulty concentrating or making decisions? 0  Walking or climbing stairs? 0  Dressing or bathing? 0   Doing errands, shopping? 0  Preparing Food and eating ? N  Using the Toilet? N  In the past six months, have you accidently leaked urine? N  Do you have problems with loss of bowel control? N  Managing your Medications? N  Managing your Finances? N  Housekeeping or managing your Housekeeping? N    Patient Care Team: Hannah Beat, MD as PCP - General Mariah Milling, Tollie Pizza, MD as PCP - Cardiology (Cardiology) Estrella Deeds, OD as Physician Assistant (Optometry)  Indicate any recent Medical Services you may have received from other than [facility] providers in the past year (date may be approximate).     Assessment:   This is a routine wellness examination for Torsten.  Hearing/Vision screen Hearing Screening - Comments:: Pt says his hearing is good Vision Screening - Comments:: Pt says his vision is good w/glasses esp for driving and reading [facility]-Dr Clide Cliff (last in [place])   Goals Addressed             This Visit's Progress    Patient Stated   Not on track    08/30/23- I will try to start walking at least 1 mile a day.         Depression Screen     08/30/2023    8:54 AM 11/12/2022    9:15 AM 11/06/2021    9:05 AM 07/19/2021    1:57 PM 12/14/2020    2:55 PM 12/14/2020    2:53 PM 08/29/2020   10:02 AM  PHQ 2/9 Scores  PHQ - 2 Score 0 0 0 0 0 0 0    Fall Risk     08/30/2023    8:49 AM 11/12/2022    9:15 AM 11/06/2021    9:05 AM 07/19/2021    1:56 PM 12/14/2020    2:55 PM  Fall Risk   Falls in the past year? 0 0 0 0 0  Number falls in past yr: 0 0  0 0  Injury with Fall? 0 0  0 0  Risk for fall due to :  No Fall Risks No Fall Risks  No Fall Risks   Follow up Education provided;Falls prevention discussed Falls evaluation completed  Falls evaluation completed     MEDICARE RISK AT HOME:  Medicare Risk at Home Any stairs in or around the home?: Yes If so, are there any without handrails?: Yes Home free of loose throw rugs in walkways, pet beds, electrical cords, etc?:  Yes Adequate lighting in your home to reduce risk of falls?: Yes Life alert?: No Use of a cane, walker or w/c?: No Grab bars in the bathroom?: No Shower chair or bench in shower?: Yes Elevated toilet seat or a handicapped toilet?: Yes  TIMED UP AND GO:  Was the test performed?  No  Cognitive Function: 6CIT completed    08/19/2019    9:48 AM  MMSE - Mini Mental State Exam  Orientation to time 5  Orientation to Place 5  Registration 3  Attention/ Calculation 5  Recall 3  Language- repeat 1        08/30/2023    9:00 AM  6CIT Screen  What Year? 0 points  What month? 0 points  What time? 0 points  Count back from 20 0 points  Months in reverse 0 points  Repeat phrase 0 points  Total Score 0 points    Immunizations Immunization History  Administered Date(s) Administered   Influenza Whole 05/24/2009   Influenza, High Dose Seasonal PF 06/16/2018   Influenza,inj,Quad PF,6+ Mos 04/24/2017   Pneumococcal Conjugate-13 01/20/2014   Pneumococcal Polysaccharide-23 01/04/2011, 09/16/2017   Tdap 04/23/2016   Zoster, Live 01/20/2014    Screening Tests Health Maintenance  Topic Date Due   COVID-19 Vaccine (1) Never done   Zoster Vaccines- Shingrix (1 of 2) 09/12/1971   INFLUENZA VACCINE  01/31/2023   Colonoscopy  11/24/2023   Medicare Annual Wellness (AWV)  08/29/2024   DTaP/Tdap/Td (2 - Td or Tdap) 04/23/2026   Pneumonia Vaccine 74+ Years old  Completed   Hepatitis C Screening  Completed   HPV VACCINES  Aged Out   Fecal DNA (Cologuard)  Discontinued    Health Maintenance  Health Maintenance Due  Topic Date Due   COVID-19 Vaccine (1) Never done   Zoster Vaccines- Shingrix (1 of 2) 09[birth year]12/1971   INFLUENZA VACCINE  01/31/2023   Colonoscopy  11/24/2023   Health Maintenance Items Addressed:   Additional Screening:  Vision Screening: Recommended annual ophthalmology exams for early detection of glaucoma and other disorders of the eye.  Dental Screening:  Recommended annual dental exams for proper oral hygiene  Community Resource Referral / Chronic Care Management: CRR required this visit?  No   CCM required this visit?  Appt scheduled with PCP     Plan:     I have personally reviewed and noted the following in the patient's chart:   Medical and social history Use of alcohol, tobacco or illicit drugs  Current medications and supplements including opioid prescriptions. Patient is not currently taking opioid prescriptions. Functional ability and status Nutritional status Physical activity Advanced directives List of other physicians Hospitalizations, surgeries, and ER visits in previous 12 months Vitals Screenings to include cognitive, depression, and falls Referrals and appointments  In addition, I have reviewed and discussed with patient certain preventive protocols, quality metrics, and best practice recommendations. A written personalized care plan for preventive services as well as general preventive health recommendations were provided to patient.     Sue Lush, LPN   10/08/8117   After Visit Summary: (MyChart)  Due to this being a telephonic visit, the after visit summary with patients personalized plan was offered to patient via MyChart   Notes: Nothing significant to report at this time.  Vaccinations: declines: Influenza vaccine: recommend every Fall Shingles vaccine: recommend Shingrix which is 2 doses 2-6 months apart and over 90% effective     Covid-19: recommend 2 doses one month apart with a booster 6 months later

## 2023-08-30 NOTE — Patient Instructions (Addendum)
 Richard Grimes , Thank you for taking time to come for your Medicare Wellness Visit. I appreciate your ongoing commitment to your health goals. Please review the following plan we discussed and let me know if I can assist you in the future.   Referrals/Orders/Follow-Ups/Clinician Recommendations: None  This is a list of the screening recommended for you and due dates:  Health Maintenance  Topic Date Due   COVID-19 Vaccine (1) Never done   Zoster (Shingles) Vaccine (1 of 2) 09[birth year]12/1971   Flu Shot  01/31/2023   Colon Cancer Screening  11/24/2023   Medicare Annual Wellness Visit  08/29/2024   DTaP/Tdap/Td vaccine (2 - Td or Tdap) 04/23/2026   Pneumonia Vaccine  Completed   Hepatitis C Screening  Completed   HPV Vaccine  Aged Out   Cologuard (Stool DNA test)  Discontinued    Advanced directives: (Copy Requested) Please bring a copy of your health care power of attorney and living will to the office to be added to your chart at your convenience.  Next Medicare Annual Wellness Visit scheduled for next year: Yes 09/01/2024 @8 :50 am televisit

## 2023-09-21 ENCOUNTER — Encounter: Payer: Self-pay | Admitting: Family Medicine

## 2023-09-21 ENCOUNTER — Other Ambulatory Visit: Payer: Self-pay | Admitting: Family Medicine

## 2023-09-23 MED ORDER — TRELEGY ELLIPTA 100-62.5-25 MCG/ACT IN AEPB: 1.0000 | INHALATION_SPRAY | Freq: Every day | RESPIRATORY_TRACT | 1 refills | Status: DC

## 2023-10-16 ENCOUNTER — Ambulatory Visit
Admission: RE | Admit: 2023-10-16 | Discharge: 2023-10-16 | Disposition: A | Discharge status: Home / Self Care | Payer: Medicare Other | Source: Ambulatory Visit | Attending: Pulmonary Disease | Admitting: Pulmonary Disease

## 2023-10-16 DIAGNOSIS — R918 Other nonspecific abnormal finding of lung field: Secondary | ICD-10-CM | POA: Diagnosis not present

## 2023-10-22 ENCOUNTER — Encounter: Payer: Self-pay | Admitting: Pulmonary Disease

## 2023-11-07 ENCOUNTER — Other Ambulatory Visit (INDEPENDENT_AMBULATORY_CARE_PROVIDER_SITE_OTHER): Payer: Medicare Other

## 2023-11-07 ENCOUNTER — Encounter (HOSPITAL_COMMUNITY): Payer: Self-pay

## 2023-11-07 DIAGNOSIS — Z125 Encounter for screening for malignant neoplasm of prostate: Secondary | ICD-10-CM | POA: Diagnosis not present

## 2023-11-07 DIAGNOSIS — E785 Hyperlipidemia, unspecified: Secondary | ICD-10-CM

## 2023-11-07 DIAGNOSIS — R7303 Prediabetes: Secondary | ICD-10-CM

## 2023-11-07 DIAGNOSIS — Z79899 Other long term (current) drug therapy: Secondary | ICD-10-CM | POA: Diagnosis not present

## 2023-11-07 LAB — CBC WITH DIFFERENTIAL/PLATELET
Basophils Absolute: 0 10*3/uL (ref 0.0–0.1)
Basophils Relative: 1 % (ref 0.0–3.0)
Eosinophils Absolute: 0.2 10*3/uL (ref 0.0–0.7)
Eosinophils Relative: 4.3 % (ref 0.0–5.0)
HCT: 45.7 % (ref 39.0–52.0)
Hemoglobin: 15 g/dL (ref 13.0–17.0)
Lymphocytes Relative: 26.3 % (ref 12.0–46.0)
Lymphs Abs: 1.3 10*3/uL (ref 0.7–4.0)
MCHC: 32.7 g/dL (ref 30.0–36.0)
MCV: 83.1 fl (ref 78.0–100.0)
Monocytes Absolute: 0.5 10*3/uL (ref 0.1–1.0)
Monocytes Relative: 11.1 % (ref 3.0–12.0)
Neutro Abs: 2.7 10*3/uL (ref 1.4–7.7)
Neutrophils Relative %: 57.3 % (ref 43.0–77.0)
Platelets: 258 10*3/uL (ref 150.0–400.0)
RBC: 5.49 Mil/uL (ref 4.22–5.81)
RDW: 14.8 % (ref 11.5–15.5)
WBC: 4.8 10*3/uL (ref 4.0–10.5)

## 2023-11-07 LAB — HEPATIC FUNCTION PANEL
ALT: 14 U/L (ref 0–53)
AST: 16 U/L (ref 0–37)
Albumin: 4.3 g/dL (ref 3.5–5.2)
Alkaline Phosphatase: 88 U/L (ref 39–117)
Bilirubin, Direct: 0.2 mg/dL (ref 0.0–0.3)
Total Bilirubin: 0.7 mg/dL (ref 0.2–1.2)
Total Protein: 6.6 g/dL (ref 6.0–8.3)

## 2023-11-07 LAB — BASIC METABOLIC PANEL WITH GFR
BUN: 12 mg/dL (ref 6–23)
CO2: 29 meq/L (ref 19–32)
Calcium: 9.4 mg/dL (ref 8.4–10.5)
Chloride: 102 meq/L (ref 96–112)
Creatinine, Ser: 0.94 mg/dL (ref 0.40–1.50)
GFR: 81.76 mL/min (ref >60.00)
Glucose, Bld: 94 mg/dL (ref 70–99)
Potassium: 4.4 meq/L (ref 3.5–5.1)
Sodium: 139 meq/L (ref 135–145)

## 2023-11-07 LAB — LIPID PANEL
Cholesterol: 113 mg/dL (ref 0–200)
HDL: 45.2 mg/dL (ref >39.00)
LDL Cholesterol: 56 mg/dL (ref 0–99)
NonHDL: 68.22
Total CHOL/HDL Ratio: 3
Triglycerides: 62 mg/dL (ref 0.0–149.0)
VLDL: 12.4 mg/dL (ref 0.0–40.0)

## 2023-11-07 LAB — HEMOGLOBIN A1C: Hgb A1c MFr Bld: 6.1 % (ref 4.6–6.5)

## 2023-11-07 LAB — PSA, MEDICARE: PSA: 0.94 ng/mL (ref 0.10–4.00)

## 2023-11-12 NOTE — Progress Notes (Unsigned)
 Richard Bolger [name], MD, CAQ Sports Medicine [facility] at [facility] [address] [place], [address]  Phone: [phone]  FAX: [phone]  Richard Grimes - 73 y.o. male  MRN [id number]  Date of Birth: 06/11/1951  Date: 11/14/2023  PCP: Scherrie Curt, MD  Referral: Scherrie Curt, MD  No chief complaint on file.  Patient Care Team: Scherrie Curt, MD as PCP - General Gollan, Deadra Everts, MD as PCP - Cardiology (Cardiology) Peri Brackett, OD as Physician Assistant (Optometry) Subjective:   Richard Grimes is a 73 y.o. pleasant patient who presents with the following:  Preventative Health Maintenance Visit:  Health Maintenance Summary Reviewed and updated, unless pt declines services.  Tobacco History Reviewed. Alcohol: No concerns, no excessive use Exercise Habits: Some activity, rec at least 30 mins 5 times a week STD concerns: no risk or activity to increase risk Drug Use: None  Richard Grimes is a very well-known patient who I have known for many years.  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  COVID Shingrix Colonoscopy    Health Maintenance  Topic Date Due   COVID-19 Vaccine (1) Never done   Zoster Vaccines- Shingrix (1 of 2) 09/12/1971   Colonoscopy  11/24/2023   INFLUENZA VACCINE  01/31/2024   Medicare Annual Wellness (AWV)  08/29/2024   DTaP/Tdap/Td (2 - Td or Tdap) 04/23/2026   Pneumonia Vaccine 33+ Years old  Completed   Hepatitis C Screening  Completed   HPV VACCINES  Aged Out   Meningococcal B Vaccine  Aged Out   Fecal DNA (Cologuard)  Discontinued   Immunization History  Administered Date(s) Administered   Influenza Whole 05/24/2009   Influenza, High Dose Seasonal PF 06/16/2018   Influenza,inj,Quad PF,6+ Mos 04/24/2017   Pneumococcal Conjugate-13 01/20/2014   Pneumococcal Polysaccharide-23 01/04/2011, 09/16/2017   Tdap 04/23/2016   Zoster, Live 01/20/2014   Patient Active Problem List   Diagnosis Date Noted   COPD  (chronic obstructive pulmonary disease) with emphysema (HCC) 05/24/2009    Priority: High   Prediabetes 11/13/2022   Pulmonary aspergillosis (HCC) 05/13/2020   Multiple lung nodules 05/03/2020   Coronary artery calcification seen on CT scan 09/18/2017   Aortic atherosclerosis (HCC) 09/16/2017   DEPRESSION 05/24/2009   GERD 05/24/2009    Past Medical History:  Diagnosis Date   Allergic rhinitis due to pollen 07/18/2011   Asthma    as a child   COPD (chronic obstructive pulmonary disease) (HCC)    Severe, based on Spirometry.    Depression    Emphysema/COPD (HCC)    GERD (gastroesophageal reflux disease)    Hx of adenomatous colonic polyps    Hyperlipidemia     Past Surgical History:  Procedure Laterality Date   BRONCHIAL BIOPSY  05/03/2020   Procedure: BRONCHIAL BIOPSIES;  Surgeon: Prudy Brownie, DO;  Location: [facility];  Service: Pulmonary;;   BRONCHIAL BRUSHINGS  05/03/2020   Procedure: BRONCHIAL BRUSHINGS;  Surgeon: Prudy Brownie, DO;  Location: [facility];  Service: Pulmonary;;   BRONCHIAL NEEDLE ASPIRATION BIOPSY  05/03/2020   Procedure: BRONCHIAL NEEDLE ASPIRATION BIOPSIES;  Surgeon: Prudy Brownie, DO;  [facility]: [facility];  Service: Pulmonary;;   BRONCHIAL WASHINGS  05/03/2020   Procedure: BRONCHIAL WASHINGS;  Surgeon: Prudy Brownie, DO;  Location: [facility];  Service: Pulmonary;;   CARDIAC CATHETERIZATION  2000   Normal coronaries   COLONOSCOPY     2018,2019,2021   COLONOSCOPY  2021   HD-MAC-mira (good after lavage)-polyps  NASAL POLYP SURGERY     1990 and 1999   POLYPECTOMY     UPPER GASTROINTESTINAL ENDOSCOPY     VIDEO BRONCHOSCOPY WITH ENDOBRONCHIAL NAVIGATION Right 05/03/2020   Procedure: VIDEO BRONCHOSCOPY WITH ENDOBRONCHIAL NAVIGATION;  Surgeon: Prudy Brownie, DO;  [facility]: [facility];  Service: Pulmonary;  Laterality: Right;    Family History  Problem Relation Age of Onset   Pulmonary fibrosis Mother    GER disease  Mother    Hypertension Mother    Colon polyps Mother 84   Hypertension Father    Melanoma Father 38       lower back   Skin cancer Father 44       lip   Colon polyps Maternal Uncle    Alzheimer's disease Maternal Grandmother    Deep vein thrombosis Maternal Grandfather    Lymphoma Maternal Grandfather 64   Deep vein thrombosis Paternal Grandmother    Deep vein thrombosis Paternal Grandfather    Skin cancer Paternal Grandfather        lip; dx. in his 40s   Lung cancer Cousin 15       smoker (maternal cousin)   Colon cancer Neg Hx    Esophageal cancer Neg Hx    Rectal cancer Neg Hx    Stomach cancer Neg Hx     Social History   Social History Narrative   Not on file    Past Medical History, Surgical History, Social History, Family History, Problem List, Medications, and Allergies have been reviewed and updated if relevant.  Review of Systems: Pertinent positives are listed above.  Otherwise, a full 14 point review of systems has been done in full and it is negative except where it is noted positive.  Objective:   There were no vitals taken for this visit. Ideal Body Weight:    Ideal Body Weight:   No results found.    08/30/2023    8:54 AM 11/12/2022    9:15 AM 11/06/2021    9:05 AM 07/19/2021    1:57 PM 12/14/2020    2:55 PM  Depression screen PHQ 2/9  Decreased Interest 0 0 0 0 0  Down, Depressed, Hopeless 0 0 0 0 0  PHQ - 2 Score 0 0 0 0 0     GEN: well developed, well nourished, no acute distress Eyes: conjunctiva and lids normal, PERRLA, EOMI ENT: TM clear, nares clear, oral exam WNL Neck: supple, no lymphadenopathy, no thyromegaly, no JVD Pulm: clear to auscultation and percussion, respiratory effort normal CV: regular rate and rhythm, S1-S2, no murmur, rub or gallop, no bruits, peripheral pulses normal and symmetric, no cyanosis, clubbing, edema or varicosities GI: soft, non-tender; no hepatosplenomegaly, masses; active bowel sounds all quadrants GU:  deferred Lymph: no cervical, axillary or inguinal adenopathy MSK: gait normal, muscle tone and strength WNL, no joint swelling, effusions, discoloration, crepitus  SKIN: clear, good turgor, color WNL, no rashes, lesions, or ulcerations Neuro: normal mental status, normal strength, sensation, and motion Psych: alert; oriented to person, place and time, normally interactive and not anxious or depressed in appearance.  All labs reviewed with patient. Results for orders placed or performed in visit on 11/07/23  Hemoglobin A1c   Collection Time: 11/07/23  8:17 AM  Result Value Ref Range   Hgb A1c MFr Bld 6.1 4.6 - 6.5 %  CBC with Differential/Platelet   Collection Time: 11/07/23  8:17 AM  Result Value Ref Range   WBC 4.8 4.0 - 10.5 K/uL  RBC 5.49 4.22 - 5.81 Mil/uL   Hemoglobin 15.0 13.0 - 17.0 g/dL   HCT 13.0 86.5 - 78.4 %   MCV 83.1 78.0 - 100.0 fl   MCHC 32.7 30.0 - 36.0 g/dL   RDW 69.6 29.5 - 28.4 %   Platelets 258.0 150.0 - 400.0 K/uL   Neutrophils Relative % 57.3 43.0 - 77.0 %   Lymphocytes Relative 26.3 12.0 - 46.0 %   Monocytes Relative 11.1 3.0 - 12.0 %   Eosinophils Relative 4.3 0.0 - 5.0 %   Basophils Relative 1.0 0.0 - 3.0 %   Neutro Abs 2.7 1.4 - 7.7 K/uL   Lymphs Abs 1.3 0.7 - 4.0 K/uL   Monocytes Absolute 0.5 0.1 - 1.0 K/uL   Eosinophils Absolute 0.2 0.0 - 0.7 K/uL   Basophils Absolute 0.0 0.0 - 0.1 K/uL  Basic metabolic panel   Collection Time: 11/07/23  8:17 AM  Result Value Ref Range   Sodium 139 135 - 145 mEq/L   Potassium 4.4 3.5 - 5.1 mEq/L   Chloride 102 96 - 112 mEq/L   CO2 29 19 - 32 mEq/L   Glucose, Bld 94 70 - 99 mg/dL   BUN 12 6 - 23 mg/dL   Creatinine, Ser 1.32 0.40 - 1.50 mg/dL   GFR 44.01 >02.72 mL/min   Calcium  9.4 8.4 - 10.5 mg/dL  PSA, Medicare   Collection Time: 11/07/23  8:17 AM  Result Value Ref Range   PSA 0.94 0.10 - 4.00 ng/ml  Lipid panel   Collection Time: 11/07/23  8:17 AM  Result Value Ref Range   Cholesterol 113 0 - 200  mg/dL   Triglycerides 53.6 0.0 - 149.0 mg/dL   HDL 64.40 >34.74 mg/dL   VLDL 25.9 0.0 - 56.3 mg/dL   LDL Cholesterol 56 0 - 99 mg/dL   Total CHOL/HDL Ratio 3    NonHDL 68.22   Hepatic function panel   Collection Time: 11/07/23  8:17 AM  Result Value Ref Range   Total Bilirubin 0.7 0.2 - 1.2 mg/dL   Bilirubin, Direct 0.2 0.0 - 0.3 mg/dL   Alkaline Phosphatase 88 39 - 117 U/L   AST 16 0 - 37 U/L   ALT 14 0 - 53 U/L   Total Protein 6.6 6.0 - 8.3 g/dL   Albumin 4.3 3.5 - 5.2 g/dL    Assessment and Plan:     ICD-10-CM   1. Healthcare maintenance  Z00.00       Health Maintenance Exam: The patient's preventative maintenance and recommended screening tests for an annual wellness exam were reviewed in full today. Brought up to date unless services declined.  Counselled on the importance of diet, exercise, and its role in overall health and mortality. The patient's FH and SH was reviewed, including their home life, tobacco status, and drug and alcohol status.  Follow-up in 1 year for physical exam or additional follow-up below.  Disposition: No follow-ups on file.  No orders of the defined types were placed in this encounter.  There are no discontinued medications. No orders of the defined types were placed in this encounter.   Signed,  Ranny Bye. Dann Ventress, MD   Allergies as of 11/14/2023       Reactions   Penicillins Rash   Reaction: 50 Years ago        Medication List        Accurate as of Nov 12, 2023  6:07 PM. If you have any questions, ask your nurse  or doctor.          aspirin EC 81 MG tablet Take 81 mg by mouth daily. Swallow whole.   atorvastatin  10 MG tablet Commonly known as: LIPITOR TAKE ONE TABLET BY MOUTH ONCE DAILY   Cal-Mag-Zinc-D Tabs Take 500 mg by mouth at bedtime.   ECHINACEA EXTRACT PO Take 1,520 mg by mouth at bedtime. 760 mg each   fluticasone  0.05 % cream Commonly known as: CUTIVATE  Apply 1 Application topically daily.    ketoconazole 2 % cream Commonly known as: NIZORAL Apply 1 Application topically daily as needed.   L-Arginine 500 MG Caps Take 500 mg by mouth at bedtime.   L-Carnitine 500 MG Caps Take 500 mg by mouth at bedtime.   multivitamin with minerals tablet Take 1 tablet by mouth daily. Centrum Silver men 50+   omeprazole  20 MG capsule Commonly known as: PRILOSEC TAKE TWO CAPSULES BY MOUTH DAILY   Proventil  HFA 108 (90 Base) MCG/ACT inhaler Generic drug: albuterol  INHALE TWO PUFFS BY MOUTH EVERY 6 HOURS AS NEEDED FOR WHEEZING OR  SHORTNESS  OF  BREATH   Trelegy Ellipta  100-62.5-25 MCG/ACT Aepb Generic drug: Fluticasone -Umeclidin-Vilant Inhale 1 puff into the lungs daily.   triamcinolone  cream 0.1 % Commonly known as: KENALOG  APPLY TOPICALLY TO AFFECTED AREA TWICE A DAY   Turmeric Curcumin 500 MG Caps Take 500 mg by mouth at bedtime.

## 2023-11-13 ENCOUNTER — Ambulatory Visit: Payer: Self-pay | Admitting: Pulmonary Disease

## 2023-11-13 DIAGNOSIS — J189 Pneumonia, unspecified organism: Secondary | ICD-10-CM

## 2023-11-13 DIAGNOSIS — R918 Other nonspecific abnormal finding of lung field: Secondary | ICD-10-CM

## 2023-11-13 MED ORDER — AZITHROMYCIN 250 MG PO TABS: ORAL_TABLET | ORAL | 0 refills | Status: DC

## 2023-11-14 ENCOUNTER — Ambulatory Visit (INDEPENDENT_AMBULATORY_CARE_PROVIDER_SITE_OTHER): Payer: Medicare Other | Admitting: Family Medicine

## 2023-11-14 ENCOUNTER — Encounter: Payer: Self-pay | Admitting: Family Medicine

## 2023-11-14 VITALS — BP 132/78 | HR 78 | Temp 98.3°F | Ht 65.0 in | Wt 180.4 lb

## 2023-11-14 DIAGNOSIS — Z0000 Encounter for general adult medical examination without abnormal findings: Secondary | ICD-10-CM | POA: Diagnosis not present

## 2023-11-14 DIAGNOSIS — Z1211 Encounter for screening for malignant neoplasm of colon: Secondary | ICD-10-CM

## 2023-11-14 DIAGNOSIS — D369 Benign neoplasm, unspecified site: Secondary | ICD-10-CM

## 2023-11-15 NOTE — Telephone Encounter (Signed)
 FYI

## 2023-12-31 ENCOUNTER — Encounter: Payer: Self-pay | Admitting: Family Medicine

## 2024-01-08 ENCOUNTER — Other Ambulatory Visit: Payer: Self-pay | Admitting: Family Medicine

## 2024-01-16 ENCOUNTER — Other Ambulatory Visit

## 2024-01-17 ENCOUNTER — Other Ambulatory Visit: Payer: Self-pay | Admitting: Family Medicine

## 2024-01-17 ENCOUNTER — Encounter: Payer: Self-pay | Admitting: Advanced Practice Midwife

## 2024-01-17 DIAGNOSIS — D508 Other iron deficiency anemias: Secondary | ICD-10-CM

## 2024-03-27 ENCOUNTER — Encounter: Payer: Self-pay | Admitting: Family Medicine

## 2024-03-27 MED ORDER — TRELEGY ELLIPTA 100-62.5-25 MCG/ACT IN AEPB: 1.0000 | INHALATION_SPRAY | Freq: Every day | RESPIRATORY_TRACT | 1 refills | Status: AC

## 2024-04-09 ENCOUNTER — Encounter: Payer: Self-pay | Admitting: Gastroenterology

## 2024-04-22 ENCOUNTER — Ambulatory Visit
Admission: RE | Admit: 2024-04-22 | Discharge: 2024-04-22 | Disposition: A | Discharge status: Home / Self Care | Source: Ambulatory Visit | Attending: Pulmonary Disease | Admitting: Pulmonary Disease

## 2024-04-22 DIAGNOSIS — R918 Other nonspecific abnormal finding of lung field: Secondary | ICD-10-CM | POA: Diagnosis not present

## 2024-04-22 DIAGNOSIS — J439 Emphysema, unspecified: Secondary | ICD-10-CM | POA: Diagnosis not present

## 2024-04-22 DIAGNOSIS — J189 Pneumonia, unspecified organism: Secondary | ICD-10-CM

## 2024-04-29 ENCOUNTER — Ambulatory Visit: Payer: Self-pay | Admitting: Pulmonary Disease

## 2024-04-29 DIAGNOSIS — R918 Other nonspecific abnormal finding of lung field: Secondary | ICD-10-CM

## 2024-05-05 ENCOUNTER — Ambulatory Visit

## 2024-05-05 VITALS — Ht 65.0 in | Wt 180.0 lb

## 2024-05-05 DIAGNOSIS — Z860100 Personal history of colon polyps, unspecified: Secondary | ICD-10-CM

## 2024-05-05 MED ORDER — NA SULFATE-K SULFATE-MG SULF 17.5-3.13-1.6 GM/177ML PO SOLN: 1.0000 | Freq: Once | ORAL | 0 refills | Status: AC

## 2024-05-05 NOTE — Progress Notes (Signed)
 No egg or soy allergy known to patient  No issues known to pt with past sedation with any surgeries or procedures Patient denies ever being told they had issues or difficulty with intubation  No FH of Malignant Hyperthermia Pt is not on diet pills Pt is not on  home 02  Pt is not on blood thinners  Pt denies issues with constipation  No A fib or A flutter Have any cardiac testing pending--no Pt can ambulate independently Pt denies use of chewing tobacco Discussed diabetic I weight loss medication holds Discussed NSAID holds Checked BMI Pt instructed to use Single[employer]com or GoodRx for a price reduction on prep  Patient's chart reviewed by Richard Grimes CNRA prior to previsit and patient appropriate for the [facility].  Pre visit completed and red dot placed by patient's name on their procedure day (on provider's schedule).

## 2024-05-15 ENCOUNTER — Encounter: Payer: Self-pay | Admitting: Gastroenterology

## 2024-05-21 ENCOUNTER — Encounter: Payer: Self-pay | Admitting: Gastroenterology

## 2024-05-21 ENCOUNTER — Ambulatory Visit (AMBULATORY_SURGERY_CENTER): Admitting: Gastroenterology

## 2024-05-21 VITALS — BP 112/88 | HR 59 | Temp 97.3°F | Resp 16 | Ht 65.0 in | Wt 180.0 lb

## 2024-05-21 DIAGNOSIS — Z860101 Personal history of adenomatous and serrated colon polyps: Secondary | ICD-10-CM

## 2024-05-21 DIAGNOSIS — K648 Other hemorrhoids: Secondary | ICD-10-CM

## 2024-05-21 DIAGNOSIS — Z1211 Encounter for screening for malignant neoplasm of colon: Secondary | ICD-10-CM

## 2024-05-21 DIAGNOSIS — Z860100 Personal history of colon polyps, unspecified: Secondary | ICD-10-CM

## 2024-05-21 DIAGNOSIS — D124 Benign neoplasm of descending colon: Secondary | ICD-10-CM | POA: Diagnosis not present

## 2024-05-21 DIAGNOSIS — D125 Benign neoplasm of sigmoid colon: Secondary | ICD-10-CM | POA: Diagnosis not present

## 2024-05-21 DIAGNOSIS — D123 Benign neoplasm of transverse colon: Secondary | ICD-10-CM | POA: Diagnosis not present

## 2024-05-21 MED ORDER — SODIUM CHLORIDE 0.9 % IV SOLN: 500.0000 mL | Freq: Once | INTRAVENOUS | Status: DC

## 2024-05-21 NOTE — Progress Notes (Signed)
 Vss nad trans to pacu

## 2024-05-21 NOTE — Progress Notes (Signed)
 Called to room to assist during endoscopic procedure.  Patient ID and intended procedure confirmed with present staff. Received instructions for my participation in the procedure from the performing physician.

## 2024-05-21 NOTE — Progress Notes (Signed)
 Pt's states no medical or surgical changes since previsit or office visit.

## 2024-05-21 NOTE — Op Note (Signed)
 [facility] Patient Name: Richard Grimes Procedure Date: 05/21/2024 8:42 AM MRN: [id number] Endoscopist: Victory L. Legrand , MD, [id number] Age: 73 Referring MD:  Date of Birth: [birth year] Gender: Male Account #: [id number] Procedure:                Colonoscopy Indications:              High risk colon polyp surveillance: Personal                            history of colonic polyps                           Multiple tubular adenomas in 2018, 2019, 2021 and                            2023 negative genetic testing for polyposis                            syndrome in 2021 Medicines:                Monitored Anesthesia Care Procedure:                Pre-Anesthesia Assessment:                           - Prior to the procedure, a History and Physical                            was performed, and patient medications and                            allergies were reviewed. The patient's tolerance of                            previous anesthesia was also reviewed. The risks                            and benefits of the procedure and the sedation                            options and risks were discussed with the patient.                            All questions were answered, and informed consent                            was obtained. Prior Anticoagulants: The patient has                            taken no anticoagulant or antiplatelet agents. ASA                            Grade Assessment: III - A patient with severe  systemic disease. After reviewing the risks and                            benefits, the patient was deemed in satisfactory                            condition to undergo the procedure.                           After obtaining informed consent, the colonoscope                            was passed under direct vision. Throughout the                            procedure, the patient's blood pressure, pulse, and                             oxygen saturations were monitored continuously. The                            CF [id number] #[id number] was introduced through the anus                            and advanced to the the cecum, identified by                            appendiceal orifice and ileocecal valve. The                            colonoscopy was somewhat difficult due to a                            redundant colon. Successful completion of the                            procedure was aided by using manual pressure and                            straightening and shortening the scope to obtain                            bowel loop reduction. The patient tolerated the                            procedure well. The quality of the bowel                            preparation was adequate after extensive lavage of                            opaque liquid and debris. The ileocecal valve,  appendiceal orifice, and rectum were photographed.                            The bowel preparation used was 2 day Suprep/Miralax. Scope In: 8:49:55 AM Scope Out: 9:17:29 AM Total Procedure Duration: 0 hours 27 minutes 34 seconds  Findings:                 The digital rectal exam findings include enlarged                            and soft prostate. DRE otherwise normal                           Repeat examination of right colon under NBI                            performed. NBI also used selectively in remainder                            of colon.                           The sigmoid colon was redundant.                           Four sessile polyps were found in the transverse                            colon. The polyps were 3 to 5 mm in size. These                            polyps were removed with a cold snare. Resection                            and retrieval were complete.                           Two sessile polyps were found in the sigmoid colon                            and descending colon. The  polyps were 3 to 5 mm in                            size. These polyps were removed with a cold snare.                            Resection and retrieval were complete.                           Internal hemorrhoids were found. The hemorrhoids                            were small.  The exam was otherwise without abnormality on                            direct and retroflexion views. Complications:            No immediate complications. Estimated Blood Loss:     Estimated blood loss was minimal. Impression:               - Enlarged prostate found on digital rectal exam.                           - Redundant colon.                           - Four 3 to 5 mm polyps in the transverse colon,                            removed with a cold snare. Resected and retrieved.                           - Two 3 to 5 mm polyps in the sigmoid colon and in                            the descending colon, removed with a cold snare.                            Resected and retrieved.                           - Internal hemorrhoids.                           - The examination was otherwise normal on direct                            and retroflexion views. Recommendation:           - Patient has a contact number available for                            emergencies. The signs and symptoms of potential                            delayed complications were discussed with the                            patient. Return to normal activities tomorrow.                            Written discharge instructions were provided to the                            patient.                           - Resume previous diet.                           -  Continue present medications.                           - Await pathology results.                           - Repeat colonoscopy in 2 years for surveillance.                            GoLytely prep for future colonoscopies (see prep                             details above) Adea Geisel L. Legrand, MD 05/21/2024 9:25:55 AM This report has been signed electronically.

## 2024-05-21 NOTE — Progress Notes (Signed)
 History and Physical:  This patient presents for endoscopic testing for: Encounter Diagnosis  Name Primary?   History of colonic polyps Yes    73 year old man here today for colonoscopy surveillance of prior colon polyps Multiple tubular adenomas in 2018, 2019, 2021 and 2023. Negative genetic testing for polyposis syndromes in 2021 Patient denies chronic abdominal pain, rectal bleeding, constipation or diarrhea.   Patient is otherwise without complaints or active issues today.   Past Medical History: Past Medical History:  Diagnosis Date   Allergic rhinitis due to pollen 07/18/2011   Asthma    as a child   COPD (chronic obstructive pulmonary disease) (HCC)    Severe, based on Spirometry.    Depression    Emphysema of lung (HCC)    Emphysema/COPD    GERD (gastroesophageal reflux disease)    Hx of adenomatous colonic polyps    Hyperlipidemia      Past Surgical History: Past Surgical History:  Procedure Laterality Date   BRONCHIAL BIOPSY  05/03/2020   Procedure: BRONCHIAL BIOPSIES;  Surgeon: Brenna Adine CROME, DO;  [facility]: [facility];  Service: Pulmonary;;   BRONCHIAL BRUSHINGS  05/03/2020   Procedure: BRONCHIAL BRUSHINGS;  Surgeon: Brenna Adine CROME, DO;  [facility]: [facility];  Service: Pulmonary;;   BRONCHIAL NEEDLE ASPIRATION BIOPSY  05/03/2020   Procedure: BRONCHIAL NEEDLE ASPIRATION BIOPSIES;  Surgeon: Brenna Adine CROME, DO;  [facility]: [facility];  Service: Pulmonary;;   BRONCHIAL WASHINGS  05/03/2020   Procedure: BRONCHIAL WASHINGS;  Surgeon: Brenna Adine CROME, DO;  Location: [facility];  Service: Pulmonary;;   CARDIAC CATHETERIZATION  2000   Normal coronaries   COLONOSCOPY     2018,2019,2021   COLONOSCOPY  2021   HD-MAC-mira (good after lavage)-polyps   NASAL POLYP SURGERY     1990 and 1999   POLYPECTOMY     UPPER GASTROINTESTINAL ENDOSCOPY     VIDEO BRONCHOSCOPY WITH ENDOBRONCHIAL NAVIGATION Right 05/03/2020   Procedure: VIDEO BRONCHOSCOPY WITH  ENDOBRONCHIAL NAVIGATION;  Surgeon: Brenna Adine CROME, DO;  [facility]: [facility];  Service: Pulmonary;  Laterality: Right;    Allergies: Allergies  Allergen Reactions   Penicillins Rash    Reaction: 50 Years ago    Outpatient Meds: Current Outpatient Medications  Medication Sig Dispense Refill   aspirin EC 81 MG tablet Take 81 mg by mouth daily. Swallow whole.     atorvastatin  (LIPITOR) 10 MG tablet TAKE ONE TABLET BY MOUTH ONCE DAILY 90 tablet 3   ECHINACEA EXTRACT PO Take 1,520 mg by mouth at bedtime. 760 mg each     fluticasone  (CUTIVATE ) 0.05 % cream Apply 1 Application topically. Twice weekly     ketoconazole (NIZORAL) 2 % cream Apply 1 Application topically. Twice Weekly     L-Arginine 500 MG CAPS Take 500 mg by mouth at bedtime.     levOCARNitine L-Tartrate (L-CARNITINE) 500 MG CAPS Take 500 mg by mouth at bedtime.     Multiple Minerals-Vitamins (CAL-MAG-ZINC-D) TABS Take 500 mg by mouth at bedtime.     Multiple Vitamins-Minerals (MULTIVITAMIN WITH MINERALS) tablet Take 1 tablet by mouth daily. Centrum Silver men 50+     Nicotine Polacrilex (RA NICOTINE GUM MT) Use as directed in the mouth or throat daily as needed.     omeprazole  (PRILOSEC) 20 MG capsule TAKE TWO CAPSULES BY MOUTH DAILY 180 capsule 3   TRELEGY ELLIPTA  100-62.5-25 MCG/ACT AEPB Inhale 1 puff into the lungs daily. 84 each 1   Turmeric Curcumin 500 MG CAPS Take 500 mg by mouth  at bedtime.     PROVENTIL  HFA 108 (90 Base) MCG/ACT inhaler INHALE TWO PUFFS BY MOUTH EVERY 6 HOURS AS NEEDED FOR WHEEZING OR  SHORTNESS  OF  BREATH 7 each 3   Current Facility-Administered Medications  Medication Dose Route Frequency Provider Last Rate Last Admin   0.9 %  sodium chloride  infusion  500 mL Intravenous Once Danis, Victory LITTIE MOULD, MD          ___________________________________________________________________ Objective   Exam:  BP 123/72   Pulse 70   Temp (!) 97.3 F (36.3 C)   Ht 5' 5 (1.651 m)   Wt 180 lb (81.6  kg)   SpO2 94%   BMI 29.95 kg/m   CV: regular , S1/S2 Resp: clear to auscultation bilaterally, normal RR and effort noted GI: soft, no tenderness, with active bowel sounds.   Assessment: Encounter Diagnosis  Name Primary?   History of colonic polyps Yes     Plan: Colonoscopy   The benefits and risks of the planned procedure(s) were described in detail with the patient or (when appropriate) their health care proxy.  Risks were outlined as including, but not limited to, bleeding, infection, perforation, adverse medication reaction leading to cardiac or pulmonary decompensation, pancreatitis (if ERCP).  The limitation of incomplete mucosal visualization was also discussed.  No guarantees or warranties were given.  The patient was provided an opportunity to ask questions and all were answered. The patient agreed with the plan.   The patient is appropriate for an endoscopic procedure in the ambulatory setting.   - Victory Brand, MD

## 2024-05-21 NOTE — Patient Instructions (Addendum)
 Handouts given: Polyps, Hemorrhoids Resume previous diet. Continue present medications.  Await pathology results. Repeat colonoscopy in 2 years for surveillance. GoLytely prep for future colonoscopies.  OU HAD AN ENDOSCOPIC PROCEDURE TODAY AT THE [facility]:   Refer to the procedure report that was given to you for any specific questions about what was found during the examination.  If the procedure report does not answer your questions, please call your gastroenterologist to clarify.  If you requested that your care partner not be given the details of your procedure findings, then the procedure report has been included in a sealed envelope for you to review at your convenience later.  YOU SHOULD EXPECT: Some feelings of bloating in the abdomen. Passage of more gas than usual.  Walking can help get rid of the air that was put into your GI tract during the procedure and reduce the bloating. If you had a lower endoscopy (such as a colonoscopy or flexible sigmoidoscopy) you may notice spotting of blood in your stool or on the toilet paper. If you underwent a bowel prep for your procedure, you may not have a normal bowel movement for a few days.  Please Note:  You might notice some irritation and congestion in your nose or some drainage.  This is from the oxygen used during your procedure.  There is no need for concern and it should clear up in a day or so.  SYMPTOMS TO REPORT IMMEDIATELY:  Following lower endoscopy (colonoscopy or flexible sigmoidoscopy):  Excessive amounts of blood in the stool  Significant tenderness or worsening of abdominal pains  Swelling of the abdomen that is new, acute  Fever of 100F or higher  For urgent or emergent issues, a gastroenterologist can be reached at any hour by calling [phone]. Do not use MyChart messaging for urgent concerns.    DIET:  We do recommend a small meal at first, but then you may proceed to your regular diet.  Drink plenty  of fluids but you should avoid alcoholic beverages for 24 hours.  ACTIVITY:  You should plan to take it easy for the rest of today and you should NOT DRIVE or use heavy machinery until tomorrow (because of the sedation medicines used during the test).    FOLLOW UP: Our staff will call the number listed on your records the next business day following your procedure.  We will call around 7:15- 8:00 am to check on you and address any questions or concerns that you may have regarding the information given to you following your procedure. If we do not reach you, we will leave a message.     If any biopsies were taken you will be contacted by phone or by letter within the next 1-3 weeks.  Please call us  at [phone] if you have not heard about the biopsies in 3 weeks.    SIGNATURES/CONFIDENTIALITY: You and/or your care partner have signed paperwork which will be entered into your electronic medical record.  These signatures attest to the fact that that the information above on your After Visit Summary has been reviewed and is understood.  Full responsibility of the confidentiality of this discharge information lies with you and/or your care-partner.

## 2024-05-22 ENCOUNTER — Telehealth: Payer: Self-pay

## 2024-05-22 NOTE — Telephone Encounter (Signed)
 Attempted to reach patient for post-procedure f/u call. No answer. Left message for him to please not hesitate to call if he has questions/concerns regarding his care.

## 2024-05-25 LAB — SURGICAL PATHOLOGY

## 2024-05-27 ENCOUNTER — Ambulatory Visit: Payer: Self-pay | Admitting: Gastroenterology

## 2024-09-01 ENCOUNTER — Ambulatory Visit: Payer: Medicare Other

## 2024-10-19 ENCOUNTER — Ambulatory Visit: Admitting: Cardiovascular Disease

## 2025-04-29 ENCOUNTER — Other Ambulatory Visit
